# Patient Record
Sex: Female | Born: 1955 | Race: White | Hispanic: No | Smoking: Former smoker
Health system: Southern US, Community
[De-identification: ages and names within clinical notes are randomized; demographics above are authoritative.]

## PROBLEM LIST (undated history)

## (undated) DIAGNOSIS — F410 Panic disorder [episodic paroxysmal anxiety] without agoraphobia: Secondary | ICD-10-CM

## (undated) DIAGNOSIS — M8580 Other specified disorders of bone density and structure, unspecified site: Secondary | ICD-10-CM

## (undated) DIAGNOSIS — E785 Hyperlipidemia, unspecified: Secondary | ICD-10-CM

## (undated) DIAGNOSIS — Z9289 Personal history of other medical treatment: Secondary | ICD-10-CM

## (undated) DIAGNOSIS — I82409 Acute embolism and thrombosis of unspecified deep veins of unspecified lower extremity: Secondary | ICD-10-CM

## (undated) DIAGNOSIS — D649 Anemia, unspecified: Secondary | ICD-10-CM

## (undated) DIAGNOSIS — J302 Other seasonal allergic rhinitis: Secondary | ICD-10-CM

## (undated) DIAGNOSIS — M1990 Unspecified osteoarthritis, unspecified site: Secondary | ICD-10-CM

## (undated) DIAGNOSIS — I10 Essential (primary) hypertension: Secondary | ICD-10-CM

## (undated) DIAGNOSIS — E7800 Pure hypercholesterolemia, unspecified: Secondary | ICD-10-CM

## (undated) DIAGNOSIS — F209 Schizophrenia, unspecified: Secondary | ICD-10-CM

## (undated) DIAGNOSIS — F4310 Post-traumatic stress disorder, unspecified: Secondary | ICD-10-CM

## (undated) HISTORY — DX: Acute embolism and thrombosis of unspecified deep veins of unspecified lower extremity: I82.409

## (undated) HISTORY — PX: TUBAL LIGATION: SHX77

## (undated) HISTORY — PX: OTHER SURGICAL HISTORY: SHX169

## (undated) HISTORY — PX: ABDOMINAL HYSTERECTOMY: SHX81

## (undated) HISTORY — PX: APPENDECTOMY: SHX54

## (undated) HISTORY — PX: COSMETIC SURGERY: SHX468

---

## 1993-10-25 DIAGNOSIS — Z8711 Personal history of peptic ulcer disease: Secondary | ICD-10-CM

## 1993-10-25 HISTORY — DX: Personal history of peptic ulcer disease: Z87.11

## 2007-03-26 ENCOUNTER — Emergency Department (HOSPITAL_COMMUNITY): Admission: EM | Admit: 2007-03-26 | Discharge: 2007-03-26 | Payer: Self-pay | Admitting: Emergency Medicine

## 2009-12-12 ENCOUNTER — Emergency Department (HOSPITAL_COMMUNITY): Admission: EM | Admit: 2009-12-12 | Discharge: 2009-12-12 | Payer: Self-pay | Admitting: Family Medicine

## 2015-07-23 ENCOUNTER — Encounter (HOSPITAL_COMMUNITY): Payer: Self-pay | Admitting: *Deleted

## 2015-07-23 ENCOUNTER — Observation Stay (HOSPITAL_COMMUNITY)
Admission: EM | Admit: 2015-07-23 | Discharge: 2015-07-24 | Disposition: A | Discharge status: Home / Self Care | Payer: Non-veteran care | Attending: Internal Medicine | Admitting: Internal Medicine

## 2015-07-23 ENCOUNTER — Observation Stay (HOSPITAL_COMMUNITY): Payer: Non-veteran care

## 2015-07-23 ENCOUNTER — Emergency Department (HOSPITAL_COMMUNITY): Payer: Non-veteran care

## 2015-07-23 DIAGNOSIS — Z720 Tobacco use: Secondary | ICD-10-CM

## 2015-07-23 DIAGNOSIS — R0789 Other chest pain: Secondary | ICD-10-CM | POA: Diagnosis present

## 2015-07-23 DIAGNOSIS — M79601 Pain in right arm: Secondary | ICD-10-CM | POA: Diagnosis not present

## 2015-07-23 DIAGNOSIS — M8580 Other specified disorders of bone density and structure, unspecified site: Secondary | ICD-10-CM | POA: Insufficient documentation

## 2015-07-23 DIAGNOSIS — M940 Chondrocostal junction syndrome [Tietze]: Secondary | ICD-10-CM | POA: Diagnosis not present

## 2015-07-23 DIAGNOSIS — E780 Pure hypercholesterolemia: Secondary | ICD-10-CM | POA: Insufficient documentation

## 2015-07-23 DIAGNOSIS — I10 Essential (primary) hypertension: Secondary | ICD-10-CM | POA: Insufficient documentation

## 2015-07-23 DIAGNOSIS — R079 Chest pain, unspecified: Secondary | ICD-10-CM | POA: Diagnosis present

## 2015-07-23 DIAGNOSIS — R072 Precordial pain: Secondary | ICD-10-CM | POA: Diagnosis not present

## 2015-07-23 DIAGNOSIS — R55 Syncope and collapse: Secondary | ICD-10-CM | POA: Diagnosis present

## 2015-07-23 DIAGNOSIS — R52 Pain, unspecified: Secondary | ICD-10-CM

## 2015-07-23 HISTORY — DX: Other seasonal allergic rhinitis: J30.2

## 2015-07-23 HISTORY — DX: Other specified disorders of bone density and structure, unspecified site: M85.80

## 2015-07-23 HISTORY — DX: Pure hypercholesterolemia, unspecified: E78.00

## 2015-07-23 HISTORY — DX: Anemia, unspecified: D64.9

## 2015-07-23 HISTORY — DX: Essential (primary) hypertension: I10

## 2015-07-23 HISTORY — DX: Hyperlipidemia, unspecified: E78.5

## 2015-07-23 LAB — CBC
HCT: 40.3 % (ref 36.0–46.0)
Hemoglobin: 13 g/dL (ref 12.0–15.0)
MCH: 29.3 pg (ref 26.0–34.0)
MCHC: 32.3 g/dL (ref 30.0–36.0)
MCV: 91 fL (ref 78.0–100.0)
PLATELETS: 293 10*3/uL (ref 150–400)
RBC: 4.43 MIL/uL (ref 3.87–5.11)
RDW: 13.9 % (ref 11.5–15.5)
WBC: 8.9 10*3/uL (ref 4.0–10.5)

## 2015-07-23 LAB — I-STAT TROPONIN, ED: TROPONIN I, POC: 0 ng/mL (ref 0.00–0.08)

## 2015-07-23 LAB — D-DIMER, QUANTITATIVE: D-Dimer, Quant: 0.32 ug/mL-FEU (ref 0.00–0.48)

## 2015-07-23 LAB — BASIC METABOLIC PANEL
Anion gap: 5 (ref 5–15)
CALCIUM: 8.8 mg/dL — AB (ref 8.9–10.3)
CO2: 26 mmol/L (ref 22–32)
CREATININE: 0.46 mg/dL (ref 0.44–1.00)
Chloride: 108 mmol/L (ref 101–111)
GFR calc Af Amer: >60 mL/min (ref >60)
GLUCOSE: 96 mg/dL (ref 65–99)
Potassium: 3.8 mmol/L (ref 3.5–5.1)
Sodium: 139 mmol/L (ref 135–145)

## 2015-07-23 LAB — TROPONIN I: Troponin I: <0.03 ng/mL (ref <0.031)

## 2015-07-23 LAB — TSH: TSH: 2.29 u[IU]/mL (ref 0.350–4.500)

## 2015-07-23 MED ORDER — MORPHINE SULFATE (PF) 2 MG/ML IV SOLN
2.0000 mg | INTRAVENOUS | Status: DC | PRN
Start: 2015-07-23 — End: 2015-07-24

## 2015-07-23 MED ORDER — MORPHINE SULFATE (PF) 4 MG/ML IV SOLN
4.0000 mg | Freq: Once | INTRAVENOUS | Status: AC
  Administered 2015-07-23: 4 mg via INTRAVENOUS
  Filled 2015-07-23: qty 1

## 2015-07-23 MED ORDER — ENOXAPARIN SODIUM 40 MG/0.4ML ~~LOC~~ SOLN
40.0000 mg | SUBCUTANEOUS | Status: DC
  Administered 2015-07-23: 40 mg via SUBCUTANEOUS
  Filled 2015-07-23: qty 0.4

## 2015-07-23 MED ORDER — LORATADINE 10 MG PO TABS: 10.0000 mg | ORAL_TABLET | Freq: Every day | ORAL | Status: DC | PRN

## 2015-07-23 MED ORDER — GUAIFENESIN 100 MG/5ML PO SOLN
30.0000 mL | Freq: Two times a day (BID) | ORAL | Status: DC | PRN
  Filled 2015-07-23: qty 30

## 2015-07-23 MED ORDER — PANTOPRAZOLE SODIUM 40 MG PO TBEC
40.0000 mg | DELAYED_RELEASE_TABLET | Freq: Every day | ORAL | Status: DC
  Administered 2015-07-23 – 2015-07-24 (×2): 40 mg via ORAL
  Filled 2015-07-23 (×2): qty 1

## 2015-07-23 MED ORDER — ACETAMINOPHEN 325 MG PO TABS: 650.0000 mg | ORAL_TABLET | ORAL | Status: DC | PRN

## 2015-07-23 MED ORDER — INFLUENZA VAC SPLIT QUAD 0.5 ML IM SUSY
0.5000 mL | PREFILLED_SYRINGE | INTRAMUSCULAR | Status: AC
  Administered 2015-07-24: 0.5 mL via INTRAMUSCULAR
  Filled 2015-07-23: qty 0.5

## 2015-07-23 MED ORDER — SODIUM CHLORIDE 0.9 % IV BOLUS (SEPSIS)
1000.0000 mL | Freq: Once | INTRAVENOUS | Status: AC
  Administered 2015-07-23: 1000 mL via INTRAVENOUS

## 2015-07-23 MED ORDER — ONDANSETRON HCL 4 MG/2ML IJ SOLN
4.0000 mg | Freq: Once | INTRAMUSCULAR | Status: AC
  Administered 2015-07-23: 4 mg via INTRAVENOUS
  Filled 2015-07-23: qty 2

## 2015-07-23 MED ORDER — NIACIN ER 500 MG PO TBCR
500.0000 mg | EXTENDED_RELEASE_TABLET | Freq: Two times a day (BID) | ORAL | Status: DC
  Administered 2015-07-23 – 2015-07-24 (×2): 500 mg via ORAL
  Filled 2015-07-23 (×3): qty 1

## 2015-07-23 MED ORDER — ASPIRIN EC 325 MG PO TBEC
325.0000 mg | DELAYED_RELEASE_TABLET | Freq: Every day | ORAL | Status: DC
  Administered 2015-07-24: 325 mg via ORAL
  Filled 2015-07-23: qty 1

## 2015-07-23 MED ORDER — GI COCKTAIL ~~LOC~~: 30.0000 mL | Freq: Four times a day (QID) | ORAL | Status: DC | PRN

## 2015-07-23 MED ORDER — ASPIRIN 81 MG PO TABS: 81.0000 mg | ORAL_TABLET | Freq: Two times a day (BID) | ORAL | Status: DC

## 2015-07-23 MED ORDER — KETOROLAC TROMETHAMINE 30 MG/ML IJ SOLN
30.0000 mg | Freq: Four times a day (QID) | INTRAMUSCULAR | Status: DC | PRN
  Administered 2015-07-24: 30 mg via INTRAVENOUS
  Filled 2015-07-23: qty 1

## 2015-07-23 MED ORDER — ALPRAZOLAM 0.25 MG PO TABS
0.2500 mg | ORAL_TABLET | Freq: Two times a day (BID) | ORAL | Status: DC | PRN
Start: 2015-07-23 — End: 2015-07-24

## 2015-07-23 MED ORDER — ONDANSETRON HCL 4 MG/2ML IJ SOLN: 4.0000 mg | Freq: Four times a day (QID) | INTRAMUSCULAR | Status: DC | PRN

## 2015-07-23 NOTE — H&P (Signed)
[facility] History and Physical                                                                                    Dana Lang, is a 59 y.o. female  MRN: [id number]   DOB - [birth year]  Admit Date - 07/23/2015  Outpatient Primary MD for the patient is [facility]  Referring Physician:  Domenic Moras, PA-C  Chief Complaint:   Chief Complaint  Patient presents with  . Arm Pain  . Chest Pain     HPI  Dana Lang  is a 59 y.o. female, with a pmh of hypertension, hyperlipidemia, and tobacco abuse. She presents via EMS to the ER today for right-sided chest pain. Dana Lang reports that she awoke at 2:38 this morning with right arm pain. She reported it felt as though she stuck her finger in an electrical socket. She tried to go back to sleep but developed nausea and vomited clear fluid. She noticed she had right-sided chest pain on her ribs just under her right breast. She got up to take shower a few hours later and became dizzy and nauseated once again. She felt as though she was going to pass out. She stepped out of the shower and became very diaphoretic. At this point she decided to call EMS.  The patient reports that she has had dizzy spells occasionally for the last several months.  She attributes these spells to allergies.  She is attempting to quit smoking and is taking nicotine pills 3-4 times per day.  She has continued to smoke 1 or 2 cigarettes per day while taking the nicotine pills.  She also reports feeling as though she may be coming down with the flu  - nausea and weakness.  This past weekend she was doing moderately heavy yard work - shoveling and cutting up tree branches. She has a positive cardiac family history. Her father died at age 63 with an MI. She has stress tests through the [place]. Her most recent negative stress test was in February 2015.  In the ER Cxr is neg, POC troponin is 0.0.  EKG does not show significant changes.   Review of Systems   In  addition to the HPI above,  No Fever-chills, No Headache, No changes with Vision or hearing, No problems swallowing food or Liquids, Normal bowel movements.  No Blood in stool or Urine, No dysuria, No new skin rashes or bruises, No new joints pains-aches,  No new weakness, tingling, numbness in any extremity, No recent weight gain or loss, A full 10 point Review of Systems was done, except as stated above, all other Review of Systems were negative.  Past Medical History  Past Medical History  Diagnosis Date  . Hypertension   . Osteopenia   . High cholesterol     History reviewed. No pertinent past surgical history.    Social History Social History  Substance Use Topics  . Smoking status: Current Some Day Smoker  . Smokeless tobacco: Not on file  . Alcohol Use: No   independent with ADLs.  Family History Family History  Problem Relation Age of Onset  . Cancer  Mother   . Heart attack Father 25    Prior to Admission medications   Medication Sig Start Date End Date Taking? Authorizing Provider  Ascorbic Acid (VITAMIN C) 1000 MG tablet Take 1,000 mg by mouth 3 (three) times a week.   Yes Historical Provider, MD  b complex vitamins tablet Take 1 tablet by mouth 3 (three) times a week.   Yes Historical Provider, MD  cholecalciferol (VITAMIN D) 1000 UNITS tablet Take 1,000 Units by mouth 3 (three) times a week.   Yes Historical Provider, MD  ferrous sulfate 325 (65 FE) MG tablet Take 325 mg by mouth 3 (three) times a week.   Yes Historical Provider, MD  ibuprofen (ADVIL,MOTRIN) 800 MG tablet Take 800 mg by mouth every other day.   Yes Historical Provider, MD  loratadine (CLARITIN) 10 MG tablet Take 10 mg by mouth daily as needed for allergies.   Yes Historical Provider, MD  niacin (SLO-NIACIN) 500 MG tablet Take 500 mg by mouth 2 (two) times daily.   Yes Historical Provider, MD    Allergies  Allergen Reactions  . Codeine Nausea And Vomiting    Physical  Exam  Vitals  Blood pressure 142/71, pulse 75, temperature 98 F (36.7 C), temperature source Oral, resp. rate 17, height 5\' 3"  (1.6 m), weight 86.183 kg (190 lb), SpO2 98 %.   General: Well-developed, well-nourished Caucasian female lying in bed in NAD, friend at bedside  Psych:  Normal affect and insight, Not Suicidal or Homicidal, Awake Alert, Oriented X 3.  Neuro:   Numbness and tingling in right hand with an inability to do movements of fine dexterity with right fingers.  Otherwise no acute abnormalities. She has an asymmetric smile from childhood surgery.  ENT:  Ears and Eyes appear Normal, Conjunctivae clear, PER. Moist oral mucosa without erythema or exudates.  Neck:  Supple, swollen right-sided anterior lymph node  Respiratory:  Symmetrical chest wall movement, Good air movement bilaterally, mild expiratory wheeze  Cardiac:  RRR, No Murmurs, no LE edema noted, no JVD.  Chest pain is reproducible on palpation  Abdomen:  Positive bowel sounds, Soft, Non tender, Non distended,  No masses appreciated  Skin:  No Cyanosis, Normal Skin Turgor, No Skin Rash or Bruise.  Extremities:  Able to move all 4. 5/5 strength in each,  no effusions.  Data Review  CBC  Recent Labs Lab 07/23/15 1140  WBC 8.9  HGB 13.0  HCT 40.3  PLT 293  MCV 91.0  MCH 29.3  MCHC 32.3  RDW 13.9    Chemistries   Recent Labs Lab 07/23/15 1140  NA 139  K 3.8  CL 108  CO2 26  GLUCOSE 96  BUN <5*  CREATININE 0.46  CALCIUM 8.8*    Imaging results:   Dg Chest 2 View  07/23/2015   CLINICAL DATA:  Chest and right arm pain for 2 days.  EXAM: CHEST  2 VIEW  COMPARISON:  None.  FINDINGS: The cardiac silhouette, mediastinal an hilar contours are within normal limits. There are chronic appearing bronchitic type interstitial lung changes but no definite infiltrates, effusions or edema. The bony thorax is intact.  IMPRESSION: Chronic appearing bronchitic type lung changes, likely related to smoking.  No infiltrates or effusions.   Electronically Signed   By: Marijo Sanes M.D.   On: 07/23/2015 12:26    My personal review of EKG: NSR, No ST changes noted.   Assessment & Plan  Principal Problem:   Chest pain Active  Problems:   Right arm pain   Costochondritis, acute   Pre-syncope   Tobacco abuse   Chest pain Most likely musculoskeletal/costochondritis. Pain is reproducible on palpation.  Will  check xray of the right ribs to rule out fracture.  Patient's symptoms of presyncope and diaphoresis may be explained by possible allergies +/- use of nicotine tablets and cigarettes.    However, given risk factors and family history, we will cycle troponins. Check 2-D echo. Check TSH.  If workup is negative please refer back to [facility].   Right arm /wrist pain and tingling. Suspect DeQuervain's tenosynovitis.  Will check an MR c-spine and right wrist.  OT consult request to evaluate for brace.   Tobacco abuse Approximately 38 pack year history.  Attempting to quit using nicotine tablets.  I strongly counseled the patient not to smoke while taking nicotine tablets.   HTN Not currently on medication at home.   HLD Continue slo-niacin.    Consultants Called:  none  Family Communication:   Friend at bedside.  Code Status:  Full  Condition:  Stable.  Potential Disposition: to home likely 9/29  Time spent in minutes : [place],  [place] on 07/23/2015 at 2:37 PM Between 7am to 7pm - Pager - [phone] After 7pm go to [email or web address] - password [email or web address] And look for the night coverage person covering me after hours

## 2015-07-23 NOTE — ED Notes (Signed)
Attempted report X1

## 2015-07-23 NOTE — Progress Notes (Signed)
Pt came to MRI and had cspine first, did 'ok' but doing wrist, pt had chest pain/spasms and was unable to hold still/cooperate for exam.  try later after pain meds and spasms have been controlled.

## 2015-07-23 NOTE — ED Notes (Signed)
Pt reports sudden onset of right hand and forearm pain today, woke her up last night. Also had sharp pain to right chest under her breast and recent nausea. ekg done, no acute distress noted. No injury to arm, only recent yardwork that could cause muscle strain.

## 2015-07-23 NOTE — ED Provider Notes (Signed)
CSN: [id number]     Arrival date & time 07/23/15  1039 History   First MD Initiated Contact with Patient 07/23/15 1152     Chief Complaint  Patient presents with  . Arm Pain  . Chest Pain     (Consider location/radiation/quality/duration/timing/severity/associated sxs/prior Treatment) HPI   59 year old female with history of hypertension and hypercholesterolemia, occasional smoker presenting for evaluation of chest pain and arm pain. Patient reports since yesterday she has had pain to her right forearm and right wrist. This morning the pain woke her up and she described as a sharp sensation with tingling sensation to the tips of her fingers. When she set up she then experiencing pain to her right side of chest below her right breast. Describe pain as a sharp sensation worsening with movement. When asked if she felt nauseous she said yes. She also mentioned that she was diaphoretic this morning as well. She took an aspirin prior to arrival but did not take her regular medication. Reports pain is mild to moderate at this time. She protruded her pain to recent yardwork she performed 3-4 days ago. States she was cutting tree limbs and picking up yard waste. She also works out twice weekly and intensity has not changed. She did try to quit smoking recently and unsure if the nausea is related to that. She denies any prior history of MI. States that she had performed a cardiac treadmill test 1-2 years prior and states that it was normal. She does take medication for hypertension and hypercholesterolemia. She denies having fever, chills, headache, lightheadedness, dizziness, shortness of breath, productive cough, back pain, abdominal pain, or rash.  Past Medical History  Diagnosis Date  . Hypertension   . Osteopenia   . High cholesterol    History reviewed. No pertinent past surgical history. History reviewed. No pertinent family history. Social History  Substance Use Topics  . Smoking status:  Current Some Day Smoker  . Smokeless tobacco: None  . Alcohol Use: No   OB History    No data available     Review of Systems  All other systems reviewed and are negative.     Allergies  Codeine  Home Medications   Prior to Admission medications   Not on File   BP 151/67 mmHg  Pulse 79  Temp(Src) 98 F (36.7 C) (Oral)  Resp 16  Ht 5\' 3"  (1.6 m)  Wt 190 lb (86.183 kg)  BMI 33.67 kg/m2  SpO2 98% Physical Exam  Constitutional: She is oriented to person, place, and time. She appears well-developed and well-nourished. No distress.  HENT:  Head: Atraumatic.  Eyes: Conjunctivae are normal.  Neck: Normal range of motion. Neck supple.  Cardiovascular: Normal rate, regular rhythm and intact distal pulses.   Pulmonary/Chest: Effort normal and breath sounds normal. No respiratory distress. She has no wheezes. She has no rales. She exhibits tenderness (tenderness to right anterior chest below right breast on palpation without crepitus or emphysema. No rash noted.).  Abdominal: Soft. There is no tenderness.  Musculoskeletal: She exhibits tenderness (Tenderness along right forearm, and right wrist on palpation without focal point tenderness. Normal overlying skin changes, no swelling on and no signs of infection noted. Radial pulse 2+. Compartment is soft. Normal grip strength.). She exhibits no edema.  Neurological: She is alert and oriented to person, place, and time.  Skin: No rash noted.  Psychiatric: She has a normal mood and affect.  Nursing note and vitals reviewed.   ED Course  Procedures (including critical care time)  Patient here with reproducible right-sided chest wall pain as well as right forearm pain. Recent yardwork. Patient is left-handed. She does have cardiac risk factors including hypertension, hypercholesterolemia, and is a smoker. Workup initiated. Pain medication offer patient declined. Her initial EKG shows no concerning ischemic changes.  HEART score of  4-5, which puts her at moderate risk for MACE.  Care discussed with Dr. Rex Kras.  Plan to perform work up and consult admission for chest pain rule out.    1:28 PM Chest x-ray shows chronic appearing bronchitic lung changes but no acute infiltrate concerning for pneumonia. Her labs reassuring. Troponin is negative. He given her risk factors, will consult medicine for admission. Patient agrees with plan. At this time she is without active chest pain she only complaining of right forearm pain which I will be giving her pain medication.  1:51 PM Appreciate consultation from Hospitalist PA, Dorinda Hill who agrees to see pt in ER and request holding order to be placed.  Pt will go to observation unit, on telemetry bed.    Labs Review Labs Reviewed  BASIC METABOLIC PANEL - Abnormal; Notable for the following:    BUN <5 (*)    Calcium 8.8 (*)    All other components within normal limits  CBC  I-STAT TROPOININ, ED    Imaging Review Dg Chest 2 View  07/23/2015   CLINICAL DATA:  Chest and right arm pain for 2 days.  EXAM: CHEST  2 VIEW  COMPARISON:  None.  FINDINGS: The cardiac silhouette, mediastinal an hilar contours are within normal limits. There are chronic appearing bronchitic type interstitial lung changes but no definite infiltrates, effusions or edema. The bony thorax is intact.  IMPRESSION: Chronic appearing bronchitic type lung changes, likely related to smoking. No infiltrates or effusions.   Electronically Signed   By: Marijo Sanes M.D.   On: 07/23/2015 12:26   I have personally reviewed and evaluated these images and lab results as part of my medical decision-making.   EKG Interpretation   Date/Time:  Wednesday July 23 2015 10:57:33 EDT Ventricular Rate:  76 PR Interval:  134 QRS Duration: 78 QT Interval:  386 QTC Calculation: 434 R Axis:   43 Text Interpretation:  Normal sinus rhythm Possible Left atrial enlargement  Borderline ECG isolated T wave inversions in aVL and  V2 without contiguous  changes Confirmed by LITTLE MD, RACHEL ([id number]) on 07/23/2015 12:18:34 PM        MDM   Final diagnoses:  Chest pain, unspecified chest pain type    BP 136/76 mmHg  Pulse 70  Temp(Src) 98 F (36.7 C) (Oral)  Resp 16  Ht 5\' 3"  (1.6 m)  Wt 190 lb (86.183 kg)  BMI 33.67 kg/m2  SpO2 98%     Domenic Moras, PA-C 07/23/15 [place], MD 07/23/15 [phone]

## 2015-07-24 ENCOUNTER — Other Ambulatory Visit (HOSPITAL_COMMUNITY): Payer: Non-veteran care

## 2015-07-24 ENCOUNTER — Observation Stay (HOSPITAL_COMMUNITY): Payer: Non-veteran care

## 2015-07-24 DIAGNOSIS — Z720 Tobacco use: Secondary | ICD-10-CM | POA: Diagnosis not present

## 2015-07-24 DIAGNOSIS — R0789 Other chest pain: Secondary | ICD-10-CM | POA: Diagnosis not present

## 2015-07-24 DIAGNOSIS — M79601 Pain in right arm: Secondary | ICD-10-CM | POA: Diagnosis not present

## 2015-07-24 DIAGNOSIS — M940 Chondrocostal junction syndrome [Tietze]: Secondary | ICD-10-CM | POA: Diagnosis not present

## 2015-07-24 LAB — TROPONIN I: Troponin I: <0.03 ng/mL (ref <0.031)

## 2015-07-24 MED ORDER — METHOCARBAMOL 500 MG PO TABS: 500.0000 mg | ORAL_TABLET | Freq: Three times a day (TID) | ORAL | Status: DC | PRN

## 2015-07-24 MED ORDER — PANTOPRAZOLE SODIUM 40 MG PO TBEC: 40.0000 mg | DELAYED_RELEASE_TABLET | Freq: Every day | ORAL | Status: DC

## 2015-07-24 NOTE — Discharge Summary (Signed)
Physician Discharge Summary  Dana Lang:[id number] DOB: [birth year] DOA: 07/23/2015  PCP: [place] date: 07/23/2015 Discharge date: 07/24/2015  Time spent: 35 minutes  Recommendations for Outpatient Follow-up:  1. Outpatient NS eval  Discharge Diagnoses:  Principal Problem:   Chest pain Active Problems:   Right arm pain   Costochondritis, acute   Pre-syncope   Tobacco abuse   Atypical chest pain   Discharge Condition: improved  Diet recommendation: cardiac  Filed Weights   07/23/15 1056 07/24/15 0500  Weight: 86.183 kg (190 lb) 86.682 kg (191 lb 1.6 oz)    History of present illness:  Dana Lang is a 59 y.o. female, with a pmh of hypertension, hyperlipidemia, and tobacco abuse. She presents via EMS to the ER today for right-sided chest pain. Miss Dionisio reports that she awoke at 2:38 this morning with right arm pain. She reported it felt as though she stuck her finger in an electrical socket. She tried to go back to sleep but developed nausea and vomited clear fluid. She noticed she had right-sided chest pain on her ribs just under her right breast. She got up to take shower a few hours later and became dizzy and nauseated once again. She felt as though she was going to pass out. She stepped out of the shower and became very diaphoretic. At this point she decided to call EMS. The patient reports that she has had dizzy spells occasionally for the last several months. She attributes these spells to allergies. She is attempting to quit smoking and is taking nicotine pills 3-4 times per day. She has continued to smoke 1 or 2 cigarettes per day while taking the nicotine pills. She also reports feeling as though she may be coming down with the flu - nausea and weakness. This past weekend she was doing moderately heavy yard work - shoveling and cutting up tree branches. She has a positive cardiac family history. Her father died at age 40 with an MI.  She has stress tests through the [place]. Her most recent negative stress test was in February 2015.  In the ER Cxr is neg, POC troponin is 0.0. EKG does not show significant changes  Hospital Course:  Chest pain Most likely musculoskeletal/costochondritis. Pain is reproducible on palpation.  - check xray of the right ribs neagtive.  -cycle troponins negative.   Right arm /wrist pain and tingling. Appears to be from nerve impingement.  Tobacco abuse Approximately 38 pack year history. Attempting to quit using nicotine tablets. I strongly counseled the patient not to smoke while taking nicotine tablets.  HTN Not currently on medication at home.  HLD Continue slo-niacin.  Procedures:  MRI  Consultations:    Discharge Exam: Filed Vitals:   07/24/15 0417  BP: 131/60  Pulse: 67  Temp: 98.3 F (36.8 C)  Resp: 16    General: awake, NAD- pain free   Discharge Instructions    Current Discharge Medication List    CONTINUE these medications which have NOT CHANGED   Details  Ascorbic Acid (VITAMIN C) 1000 MG tablet Take 1,000 mg by mouth 3 (three) times a week.    aspirin 81 MG tablet Take 81 mg by mouth 2 (two) times daily.    b complex vitamins tablet Take 1 tablet by mouth 3 (three) times a week.    cholecalciferol (VITAMIN D) 1000 UNITS tablet Take 1,000 Units by mouth 3 (three) times a week.    ferrous sulfate 325 (65 FE)  MG tablet Take 325 mg by mouth 3 (three) times a week.    guaiFENesin (ROBITUSSIN) 100 MG/5ML liquid Take 30 mLs by mouth 2 (two) times daily as needed for cough.    ibuprofen (ADVIL,MOTRIN) 800 MG tablet Take 800 mg by mouth every other day.    loratadine (CLARITIN) 10 MG tablet Take 10 mg by mouth daily as needed for allergies.    niacin (SLO-NIACIN) 500 MG tablet Take 500 mg by mouth 2 (two) times daily.       Allergies  Allergen Reactions  . Codeine Nausea And Vomiting      The results of significant diagnostics from this  hospitalization (including imaging, microbiology, ancillary and laboratory) are listed below for reference.    Significant Diagnostic Studies: Dg Chest 2 View  07/23/2015   CLINICAL DATA:  Chest and right arm pain for 2 days.  EXAM: CHEST  2 VIEW  COMPARISON:  None.  FINDINGS: The cardiac silhouette, mediastinal an hilar contours are within normal limits. There are chronic appearing bronchitic type interstitial lung changes but no definite infiltrates, effusions or edema. The bony thorax is intact.  IMPRESSION: Chronic appearing bronchitic type lung changes, likely related to smoking. No infiltrates or effusions.   Electronically Signed   By: Marijo Sanes M.D.   On: 07/23/2015 12:26   Dg Ribs Unilateral Right  07/23/2015   CLINICAL DATA:  Fall landing on right side today, now with right lower rib pain.  EXAM: RIGHT RIBS - 2 VIEW  COMPARISON:  Frontal and lateral chest radiographs performed earlier this day.  FINDINGS: No fracture or other bone lesions are seen involving the ribs. No consolidation, pleural effusion or pneumothorax in the included right lung.  IMPRESSION: Intact right ribs without evidence of fracture.   Electronically Signed   By: Jeb Levering M.D.   On: 07/23/2015 18:11   Mr Cervical Spine Wo Contrast  07/23/2015   CLINICAL DATA:  Atypical chest pain and right upper extremity pain after raking and pulling weeds in the [place] the few days ago.  EXAM: MRI CERVICAL SPINE WITHOUT CONTRAST  TECHNIQUE: Multiplanar, multisequence MR imaging of the cervical spine was performed. No intravenous contrast was administered.  COMPARISON:  None.  FINDINGS: Despite efforts by the technologist and patient, motion artifact is present on today's exam and could not be eliminated. This reduces exam sensitivity and specificity.  There is some degenerative spurring at the craniocervical junction. I doubt that this is impinging on the C1 nerves. Minimal edema anteriorly in the odontoid likely from C1-2  arthropathy.  No significant abnormal spinal cord signal is observed. No vertebral subluxation is observed. Hemangioma observed eccentric to the right at the T1 level.  Additional findings at individual levels are as follows:  C2-3:  No impingement.  Right greater than left facet arthropathy.  C3-4: Moderate to prominent right and moderate left foraminal stenosis and borderline central narrowing of the thecal sac due to facet and uncinate spurring along with small central disc protrusion.  C4-5: Mild left foraminal stenosis and borderline central narrowing of the thecal sac due to central disc protrusion, facet arthropathy, and ligamentum flavum redundancy. There is also some vertebral artery loop formation in the left neural foramen which can rarely contribute to impingement.  C5-6: Moderate right and mild to moderate left foraminal stenosis and mild central narrowing of the thecal sac due to disc bulge, uncinate spurring, and facet arthropathy. There is also vertebral artery loop formation in the right neural foramen at  this level, which can't really contribute to impingement.  C6-7: Mild bilateral foraminal stenosis and mild left eccentric central narrowing of the thecal sac due to the left paracentral disc protrusion and spurring, uncinate spurring, and facet arthropathy.  C7-T1:  No impingement.  Mild facet arthropathy.  IMPRESSION: 1. Cervical spondylosis and degenerative disc disease, causing moderate to prominent impingement at C3-4 ; moderate impingement at C5-6 ; and mild impingement at C4-5 and C6-7, as detailed above.   Electronically Signed   By: Van Clines M.D.   On: 07/23/2015 17:35    Microbiology: No results found for this or any previous visit (from the past 240 hour(s)).   Labs: Basic Metabolic Panel:  Recent Labs Lab 07/23/15 1140  NA 139  K 3.8  CL 108  CO2 26  GLUCOSE 96  BUN <5*  CREATININE 0.46  CALCIUM 8.8*   Liver Function Tests: No results for input(s): AST,  ALT, ALKPHOS, BILITOT, PROT, ALBUMIN in the last 168 hours. No results for input(s): LIPASE, AMYLASE in the last 168 hours. No results for input(s): AMMONIA in the last 168 hours. CBC:  Recent Labs Lab 07/23/15 1140  WBC 8.9  HGB 13.0  HCT 40.3  MCV 91.0  PLT 293   Cardiac Enzymes:  Recent Labs Lab 07/23/15 1900 07/24/15 0054  TROPONINI <0.03 <0.03   BNP: BNP (last 3 results) No results for input(s): BNP in the last 8760 hours.  ProBNP (last 3 results) No results for input(s): PROBNP in the last 8760 hours.  CBG: No results for input(s): GLUCAP in the last 168 hours.     SignedEulogio Bear  [facility] 07/24/2015, 10:36 AM

## 2015-08-06 ENCOUNTER — Encounter: Payer: Self-pay | Admitting: Internal Medicine

## 2015-10-02 ENCOUNTER — Encounter: Payer: Self-pay | Admitting: Internal Medicine

## 2015-10-02 ENCOUNTER — Ambulatory Visit (INDEPENDENT_AMBULATORY_CARE_PROVIDER_SITE_OTHER): Payer: No Typology Code available for payment source | Admitting: Internal Medicine

## 2015-10-02 VITALS — BP 118/72 | HR 92 | Ht 63.0 in | Wt 192.0 lb

## 2015-10-02 DIAGNOSIS — K625 Hemorrhage of anus and rectum: Secondary | ICD-10-CM | POA: Diagnosis not present

## 2015-10-02 DIAGNOSIS — K219 Gastro-esophageal reflux disease without esophagitis: Secondary | ICD-10-CM | POA: Diagnosis not present

## 2015-10-02 DIAGNOSIS — K5909 Other constipation: Secondary | ICD-10-CM

## 2015-10-02 DIAGNOSIS — R1012 Left upper quadrant pain: Secondary | ICD-10-CM

## 2015-10-02 MED ORDER — NA SULFATE-K SULFATE-MG SULF 17.5-3.13-1.6 GM/177ML PO SOLN: 1.0000 | Freq: Once | ORAL | Status: DC

## 2015-10-02 NOTE — Patient Instructions (Signed)

## 2015-10-02 NOTE — Progress Notes (Signed)
HISTORY OF PRESENT ILLNESS:  Dana Lang is a 59 y.o. female , retired Social research officer, government, with past history as listed below. She is referred by her primary care providers at the [facility] (Dr. Peyton Lang) with chief complaints of upper abdominal pain, and rectal bleeding. There are no accompanying records. Patient reports having undergone colonoscopy in 1992 elsewhere. Results uncertain. She also tells me that she underwent upper endoscopy in 1986 and around 1995 for "bleeding ulcers". Again, no details. Current history is that of intermittent left upper quadrant pain seemingly exacerbated by certain movements. No effects from meals. She does have GERD for which she takes pantoprazole 40 mg daily. Symptoms controlled with medication. Significant symptoms off medication. Despite a history of bleeding ulcers, she does take ibuprofen 800 mg almost daily. She denies dysphagia. She does report history of esophageal cancer in her mother. Next, she has had problems with intermittent constipation for which she takes Dulcolax. Intermittent rectal bleeding (minor) attributed to hemorrhoids. She has had 10 pound weight gain over the past year. Recent hospitalization for noncardiac chest pain (reviewed). Possible history of colon cancer in her mother, though she is not certain. Other GI complaints include bloating, anal discomfort. She is status post appendectomy and hysterectomy. She smokes daily. Finally, she is on chronic iron she tells me for "pernicious anemia".  REVIEW OF SYSTEMS:  All non-GI ROS negative except for sinus and allergy, anxiety, arthritis, back pain, visual change, fatigue, headaches, hearing problems, muscle cramps, night sweats, shortness of breath, ankle edema, lymph nodes, increased thirst, increased urination, urinary frequency, urinary leakage  Past Medical History  Diagnosis Date  . Hypertension   . Osteopenia   . High cholesterol   . Hyperlipidemia   . Anemia   . Seasonal allergies   .  Osteopenia     Past Surgical History  Procedure Laterality Date  . Abdominal hysterectomy    . Appendectomy    . Cosmetic surgery    . Tubal ligation      Social History Dana Lang  reports that she quit smoking about 2 months ago. Her smoking use included Cigarettes. She quit after 40 years of use. She has never used smokeless tobacco. She reports that she does not drink alcohol or use illicit drugs.  family history includes Cancer in her mother; Heart attack (age of onset: 2) in her father.  Allergies  Allergen Reactions  . Codeine Nausea And Vomiting       PHYSICAL EXAMINATION: Vital signs: BP 118/72 mmHg  Pulse 92  Ht 5\' 3"  (1.6 m)  Wt 192 lb (87.091 kg)  BMI 34.02 kg/m2  Constitutional: Unhealthy appearing, no acute distress. Pleasant Psychiatric: alert and oriented x3, cooperative Eyes: extraocular movements intact, anicteric, conjunctiva pink Mouth: oral pharynx moist, no lesions Neck: supple without thyromegaly Lymph: no lymphadenopathy Cardiovascular: heart regular rate and rhythm, no murmur Lungs: clear to auscultation bilaterally Abdomen: soft, obese, nontender, nondistended, no obvious ascites, no peritoneal signs, normal bowel sounds, no organomegaly Rectal: To be performed at colonoscopy Extremities: no clubbing cyanosis or lower extremity edema bilaterally Skin: no lesions on visible extremities Neuro: No focal deficits. Cranial nerves intact. No asterixis.   ASSESSMENT:  #1. Intermittent problems with upper abdominal pain, mainly left upper quadrant. By history sounds musculoskeletal. #2. History of complicated ulcer disease (bleeding) per patient. H. pylori status uncertain. She takes regular NSAIDs for joint aches #3. Constipation. Requires Dulcolax #4. Intermittent rectal bleeding. Rule out neoplasia #5. Gen. medical problems. Cared for at  [place]   PLAN:  #1. Upper endoscopy to evaluate upper abdominal pain and patient with a  history of complicated ulcer disease. Also, obtain biopsies to rule out Helicobacter pylori.The nature of the procedure, as well as the risks, benefits, and alternatives were carefully and thoroughly reviewed with the patient. Ample time for discussion and questions allowed. The patient understood, was satisfied, and agreed to proceed. #2. Colonoscopy to evaluate rectal bleeding. Also provide colon cancer screening. Last colonoscopy by her report 25 years ago.The nature of the procedure, as well as the risks, benefits, and alternatives were carefully and thoroughly reviewed with the patient. Ample time for discussion and questions allowed. The patient understood, was satisfied, and agreed to proceed. #3. Continue Dulcolax as needed for constipation #4. Reflux precautions with attention to weight loss #5. Continue pantoprazole to control GERD symptoms as well as protect against recurrent ulcers in a patient who uses NSAIDs #6. Discussed with your primary care physician alternatives to NSAIDs given your history of complicated ulcer disease, for your pain management  A copy of this consultation note has been sent to Dr. Peyton Lang

## 2015-11-17 ENCOUNTER — Telehealth: Payer: Self-pay | Admitting: Internal Medicine

## 2015-11-17 NOTE — Telephone Encounter (Signed)
Left message for patient to call me back. 

## 2015-11-19 NOTE — Telephone Encounter (Signed)
Left message to call me back.

## 2015-11-20 NOTE — Telephone Encounter (Signed)
Dana Lang, CMA spoke with patient and put a suprep sample up front for pick up yesterday.

## 2015-11-24 ENCOUNTER — Encounter: Payer: Self-pay | Admitting: Internal Medicine

## 2015-11-24 ENCOUNTER — Ambulatory Visit (INDEPENDENT_AMBULATORY_CARE_PROVIDER_SITE_OTHER): Payer: Non-veteran care | Admitting: Internal Medicine

## 2015-11-24 VITALS — BP 184/94 | HR 74 | Temp 97.7°F | Resp 22 | Ht 63.0 in | Wt 192.0 lb

## 2015-11-24 DIAGNOSIS — K253 Acute gastric ulcer without hemorrhage or perforation: Secondary | ICD-10-CM

## 2015-11-24 DIAGNOSIS — K625 Hemorrhage of anus and rectum: Secondary | ICD-10-CM | POA: Diagnosis not present

## 2015-11-24 DIAGNOSIS — K5730 Diverticulosis of large intestine without perforation or abscess without bleeding: Secondary | ICD-10-CM

## 2015-11-24 DIAGNOSIS — Z1211 Encounter for screening for malignant neoplasm of colon: Secondary | ICD-10-CM

## 2015-11-24 DIAGNOSIS — K219 Gastro-esophageal reflux disease without esophagitis: Secondary | ICD-10-CM | POA: Diagnosis not present

## 2015-11-24 DIAGNOSIS — R1012 Left upper quadrant pain: Secondary | ICD-10-CM

## 2015-11-24 MED ORDER — SODIUM CHLORIDE 0.9 % IV SOLN: 500.0000 mL | INTRAVENOUS | Status: DC

## 2015-11-24 NOTE — Op Note (Signed)
[place]  [address]. [place], [address]   COLONOSCOPY PROCEDURE REPORT  PATIENT: Dana Lang, Dana Lang  MR#: [id number] BIRTHDATE: 1956/09/07 , 43  yrs. old GENDER: female ENDOSCOPIST: Eustace Quail, MD REFERRED BY: [facility]. PROCEDURE DATE:  11/24/2015 PROCEDURE:   Colonoscopy, screening First Screening Colonoscopy - Avg.  risk and is 50 yrs.  old or older - No.  Prior Negative Screening - Now for repeat screening. 10 or more years since last screening  History of Adenoma - Now for follow-up colonoscopy & has been > or = to 3 yrs.  N/A  Polyps removed today? No Recommend repeat exam, <10 yrs? No ASA CLASS:   Class II INDICATIONS:Screening for colonic neoplasia and Colorectal Neoplasm Risk Assessment for this procedure is average risk. MEDICATIONS: Propofol 250 mg IV and Monitored anesthesia care  DESCRIPTION OF PROCEDURE:   After the risks benefits and alternatives of the procedure were thoroughly explained, informed consent was obtained.  The digital rectal exam revealed no abnormalities of the rectum.   The LB [id number]  endoscope was introduced through the anus and advanced to the cecum, which was identified by both the appendix and ileocecal valve. No adverse events experienced.   The quality of the prep was adequate (Suprep was used)  The instrument was then slowly withdrawn as the colon was fully examined. Estimated blood loss is zero unless otherwise noted in this procedure report.    COLON FINDINGS: There was mild diverticulosis noted in the right colon and left colon.   The examination was otherwise normal. Retroflexed views revealed internal hemorrhoids. The time to cecum = 3.4 Withdrawal time = 11.6   The scope was withdrawn and the procedure completed. COMPLICATIONS: There were no immediate complications.  ENDOSCOPIC IMPRESSION: 1.   Mild diverticulosis was noted in the right colon and left colon  2.   The  examination was otherwise normal  RECOMMENDATIONS: 1.  Continue current colorectal screening recommendations for "routine risk" patients with a repeat colonoscopy in 10 years. 2.  Upper endoscopy today (please see report for findings and recommendations)  eSigned:  Eustace Quail, MD 11/24/2015 1:40 PM   cc: The Patient  ; [facility]

## 2015-11-24 NOTE — Patient Instructions (Signed)

## 2015-11-24 NOTE — Progress Notes (Signed)
Report to PACU, RN, vss, BBS= Clear.  

## 2015-11-24 NOTE — Op Note (Signed)
[place]  [address]. [place], [address]   ENDOSCOPY PROCEDURE REPORT  PATIENT: Dana Lang, Dana Lang  MR#: [id number] BIRTHDATE: Sep 05, 1956 , 84  yrs. old GENDER: female ENDOSCOPIST: Eustace Quail, MD REFERRED BY:   [facility]. PROCEDURE DATE:  11/24/2015 PROCEDURE:  EGD w/ biopsy ASA CLASS:     Class II INDICATIONS:  abdominal pain. MEDICATIONS: Monitored anesthesia care and Propofol 250 mg IV TOPICAL ANESTHETIC: none  DESCRIPTION OF PROCEDURE: After the risks benefits and alternatives of the procedure were thoroughly explained, informed consent was obtained.  The LB [id number] endoscope was introduced through the mouth and advanced to the second portion of the duodenum , Without limitations.  The instrument was slowly withdrawn as the mucosa was fully examined.  EXAM:Esophagus was normal.  The stomach revealed a small prepyloric ulcer and several erosions.  CLO biopsy taken.  The duodenum was normal.  Retroflexed views revealed no abnormalities.     The scope was then withdrawn from the patient and the procedure completed.  COMPLICATIONS: There were no immediate complications.  ENDOSCOPIC IMPRESSION: 1. Gastric ulcer and erosions likely secondary to NSAIDs, status post CLO biopsy 2. GERD (by history)  RECOMMENDATIONS: 1.  Continue pantoprazole daily 2.  Avoid NSAIDS given the presence of ulcers on today's exam and a history of complicated ulcers. Talk to your primary care physician about other alternative treatments for your joint pains 3.  Rx CLO if positive  REPEAT EXAM:  eSigned:  Eustace Quail, MD 11/24/2015 1:49 PM    CC:The Patient  ; [facility]

## 2015-11-24 NOTE — Progress Notes (Signed)
Called to room to assist during endoscopic procedure.  Patient ID and intended procedure confirmed with present staff. Received instructions for my participation in the procedure from the performing physician.  

## 2015-11-25 ENCOUNTER — Telehealth: Payer: Self-pay | Admitting: *Deleted

## 2015-11-25 LAB — HELICOBACTER PYLORI SCREEN-BIOPSY: UREASE: POSITIVE — AB

## 2015-11-25 NOTE — Telephone Encounter (Signed)
Invalid telephone number, unable to leave a message.

## 2015-11-27 ENCOUNTER — Other Ambulatory Visit: Payer: Self-pay

## 2015-11-27 MED ORDER — METRONID-TETRACYC-BIS SUBSAL PO MISC: ORAL | Status: DC

## 2015-11-28 ENCOUNTER — Other Ambulatory Visit: Payer: Self-pay

## 2015-11-28 MED ORDER — OMEPRAZOLE 20 MG PO CPDR: 20.0000 mg | DELAYED_RELEASE_CAPSULE | Freq: Two times a day (BID) | ORAL | Status: DC

## 2015-11-28 MED ORDER — METRONIDAZOLE 250 MG PO TABS: 250.0000 mg | ORAL_TABLET | Freq: Four times a day (QID) | ORAL | Status: DC

## 2015-11-28 MED ORDER — AMOXICILLIN 500 MG PO CAPS: 500.0000 mg | ORAL_CAPSULE | Freq: Four times a day (QID) | ORAL | Status: DC

## 2015-12-03 ENCOUNTER — Telehealth: Payer: Self-pay | Admitting: Internal Medicine

## 2015-12-03 NOTE — Telephone Encounter (Signed)
Spoke with pt and discussed with her that she is to take both antibiotics together to treat the H pylori and to be sure and finish the meds. Pt verbalized understanding.

## 2016-02-21 ENCOUNTER — Encounter (HOSPITAL_COMMUNITY): Payer: Self-pay

## 2016-02-21 ENCOUNTER — Emergency Department (HOSPITAL_COMMUNITY)
Admission: EM | Admit: 2016-02-21 | Discharge: 2016-02-21 | Disposition: A | Discharge status: Home / Self Care | Payer: Non-veteran care | Attending: Emergency Medicine | Admitting: Emergency Medicine

## 2016-02-21 DIAGNOSIS — Z792 Long term (current) use of antibiotics: Secondary | ICD-10-CM | POA: Diagnosis not present

## 2016-02-21 DIAGNOSIS — M8580 Other specified disorders of bone density and structure, unspecified site: Secondary | ICD-10-CM | POA: Insufficient documentation

## 2016-02-21 DIAGNOSIS — Z8639 Personal history of other endocrine, nutritional and metabolic disease: Secondary | ICD-10-CM | POA: Diagnosis not present

## 2016-02-21 DIAGNOSIS — Z7982 Long term (current) use of aspirin: Secondary | ICD-10-CM | POA: Insufficient documentation

## 2016-02-21 DIAGNOSIS — B86 Scabies: Secondary | ICD-10-CM

## 2016-02-21 DIAGNOSIS — D649 Anemia, unspecified: Secondary | ICD-10-CM | POA: Insufficient documentation

## 2016-02-21 DIAGNOSIS — Z791 Long term (current) use of non-steroidal anti-inflammatories (NSAID): Secondary | ICD-10-CM | POA: Insufficient documentation

## 2016-02-21 DIAGNOSIS — Z79899 Other long term (current) drug therapy: Secondary | ICD-10-CM | POA: Diagnosis not present

## 2016-02-21 DIAGNOSIS — F17210 Nicotine dependence, cigarettes, uncomplicated: Secondary | ICD-10-CM | POA: Insufficient documentation

## 2016-02-21 DIAGNOSIS — I10 Essential (primary) hypertension: Secondary | ICD-10-CM | POA: Insufficient documentation

## 2016-02-21 DIAGNOSIS — R21 Rash and other nonspecific skin eruption: Secondary | ICD-10-CM | POA: Diagnosis present

## 2016-02-21 MED ORDER — PERMETHRIN 5 % EX CREA: TOPICAL_CREAM | CUTANEOUS | Status: DC

## 2016-02-21 MED ORDER — HYDROXYZINE HCL 10 MG PO TABS
10.0000 mg | ORAL_TABLET | Freq: Four times a day (QID) | ORAL | Status: DC | PRN
Start: 2016-02-21 — End: 2021-01-19

## 2016-02-21 NOTE — ED Provider Notes (Signed)
CSN: [id number]     Arrival date & time 02/21/16  1512 History   First MD Initiated Contact with Patient 02/21/16 1607     Chief Complaint  Patient presents with  . Rash     (Consider location/radiation/quality/duration/timing/severity/associated sxs/prior Treatment) HPI Comments: Patient presents to the ED with a chief complaint of rash.  She states that she noticed a very itchy rash on her extremities that has spread distal to proximal in the past 2 days.  She states that she first noticed it around her feet and hands.  She denies any fevers or chills.  She has applied calamine lotion with minimal relief.  She denies any nausea, vomiting, or diarrhea.   The history is provided by the patient. No language interpreter was used.    Past Medical History  Diagnosis Date  . Hypertension   . Osteopenia   . High cholesterol   . Hyperlipidemia   . Anemia   . Seasonal allergies   . Osteopenia    Past Surgical History  Procedure Laterality Date  . Abdominal hysterectomy    . Appendectomy    . Cosmetic surgery    . Tubal ligation     Family History  Problem Relation Age of Onset  . Esophageal cancer Mother   . Heart attack Father 35   Social History  Substance Use Topics  . Smoking status: Current Every Day Smoker -- 0.25 packs/day for 40 years    Types: Cigarettes  . Smokeless tobacco: Never Used  . Alcohol Use: No   OB History    No data available     Review of Systems  Constitutional: Negative for fever and chills.  Respiratory: Negative for shortness of breath.   Cardiovascular: Negative for chest pain.  Gastrointestinal: Negative for nausea, vomiting, diarrhea and constipation.  Genitourinary: Negative for dysuria.  All other systems reviewed and are negative.     Allergies  Codeine  Home Medications   Prior to Admission medications   Medication Sig Start Date End Date Taking? Authorizing Provider  amoxicillin (AMOXIL) 500 MG capsule Take 1 capsule (500 mg  total) by mouth 4 (four) times daily. 11/28/15   Irene Shipper, MD  Ascorbic Acid (VITAMIN C) 1000 MG tablet Take 1,000 mg by mouth 3 (three) times a week.    Historical Provider, MD  aspirin 81 MG tablet Take 81 mg by mouth 2 (two) times daily.    Historical Provider, MD  b complex vitamins tablet Take 1 tablet by mouth 3 (three) times a week.    Historical Provider, MD  cholecalciferol (VITAMIN D) 1000 UNITS tablet Take 1,000 Units by mouth 3 (three) times a week.    Historical Provider, MD  ferrous sulfate 325 (65 FE) MG tablet Take 325 mg by mouth 3 (three) times a week.    Historical Provider, MD  guaiFENesin (ROBITUSSIN) 100 MG/5ML liquid Take 30 mLs by mouth 2 (two) times daily as needed for cough.    Historical Provider, MD  hydrOXYzine (ATARAX/VISTARIL) 10 MG tablet Take 1 tablet (10 mg total) by mouth every 6 (six) hours as needed for itching. 02/21/16   Montine Circle, PA-C  ibuprofen (ADVIL,MOTRIN) 800 MG tablet Take 800 mg by mouth every other day.    Historical Provider, MD  loratadine (CLARITIN) 10 MG tablet Take 10 mg by mouth daily as needed for allergies.    Historical Provider, MD  Metronid-Tetracyc-Bis Milagros Reap MISC Take as directed 11/27/15   Irene Shipper, MD  metroNIDAZOLE (  FLAGYL) 250 MG tablet Take 1 tablet (250 mg total) by mouth 4 (four) times daily. 11/28/15   Irene Shipper, MD  niacin (SLO-NIACIN) 500 MG tablet Take 500 mg by mouth 2 (two) times daily.    Historical Provider, MD  omeprazole (PRILOSEC) 20 MG capsule Take 1 capsule (20 mg total) by mouth 2 (two) times daily before a meal. 11/28/15   Irene Shipper, MD  pantoprazole (PROTONIX) 40 MG tablet Take 1 tablet (40 mg total) by mouth daily. 07/24/15   Geradine Girt, DO  permethrin (ELIMITE) 5 % cream Apply to entire body other than face - let sit for 14 hours then wash off, may repeat in 1 week if still having symptoms 02/21/16   Montine Circle, PA-C   BP 166/78 mmHg  Pulse 104  Temp(Src) 98.2 F (36.8 C) (Oral)  Resp 18   SpO2 98% Physical Exam  Constitutional: She is oriented to person, place, and time. She appears well-developed and well-nourished.  HENT:  Head: Normocephalic and atraumatic.  Eyes: Conjunctivae and EOM are normal.  Neck: Normal range of motion.  Cardiovascular: Normal rate.   Pulmonary/Chest: Effort normal.  Abdominal: She exhibits no distension.  Musculoskeletal: Normal range of motion.  Neurological: She is alert and oriented to person, place, and time.  Skin: Skin is dry.  Scattered maculopapular rash on extremities, appear like bites, possibly scabies vs flea vs bedbugs  Psychiatric: She has a normal mood and affect. Her behavior is normal. Judgment and thought content normal.  Nursing note and vitals reviewed.   ED Course  Procedures (including critical care time)   MDM   Final diagnoses:  Scabies    Discussed diagnosis & treatment of scabies with patient.  They have been advised to followup with her primary care doctor 2 weeks after treatment.  Discussed the proper application of permethrin cream.  Patient  advised to repeat treatment in one week. No signs of secondary infection.  Discussed return precautions. Patient advised to follow up with PCP for unresolved symptoms. Patient appears safe for discharge.     Montine Circle, PA-C 02/21/16 1613  Sherwood Gambler, MD 02/26/16 [phone]

## 2016-02-21 NOTE — ED Notes (Signed)
Seen at triage by PA and discharged, see triage note for assessmwent

## 2016-02-21 NOTE — Discharge Instructions (Signed)

## 2016-02-21 NOTE — ED Notes (Signed)
Patient here with itchy rash that started on hands and now legs , neck and abdomen.

## 2016-02-23 ENCOUNTER — Telehealth: Payer: Self-pay | Admitting: *Deleted

## 2016-02-23 NOTE — Telephone Encounter (Signed)
Pt called asking that her Rx be faxed to [facility].  ERCM faxed to  [phone] as requested.

## 2017-05-02 ENCOUNTER — Emergency Department (HOSPITAL_COMMUNITY): Payer: Non-veteran care

## 2017-05-02 ENCOUNTER — Encounter (HOSPITAL_COMMUNITY): Payer: Self-pay | Admitting: Emergency Medicine

## 2017-05-02 ENCOUNTER — Ambulatory Visit (HOSPITAL_COMMUNITY)
Admission: EM | Admit: 2017-05-02 | Discharge: 2017-05-02 | Disposition: A | Discharge status: Nursing Home (Includes ICF) | Payer: Non-veteran care | Attending: Internal Medicine | Admitting: Internal Medicine

## 2017-05-02 DIAGNOSIS — R9431 Abnormal electrocardiogram [ECG] [EKG]: Secondary | ICD-10-CM | POA: Diagnosis not present

## 2017-05-02 DIAGNOSIS — I10 Essential (primary) hypertension: Secondary | ICD-10-CM | POA: Diagnosis not present

## 2017-05-02 DIAGNOSIS — F17210 Nicotine dependence, cigarettes, uncomplicated: Secondary | ICD-10-CM | POA: Insufficient documentation

## 2017-05-02 DIAGNOSIS — Z79899 Other long term (current) drug therapy: Secondary | ICD-10-CM | POA: Diagnosis not present

## 2017-05-02 DIAGNOSIS — R002 Palpitations: Secondary | ICD-10-CM | POA: Diagnosis present

## 2017-05-02 DIAGNOSIS — R079 Chest pain, unspecified: Secondary | ICD-10-CM | POA: Insufficient documentation

## 2017-05-02 DIAGNOSIS — R51 Headache: Secondary | ICD-10-CM

## 2017-05-02 DIAGNOSIS — R000 Tachycardia, unspecified: Secondary | ICD-10-CM

## 2017-05-02 DIAGNOSIS — T50905A Adverse effect of unspecified drugs, medicaments and biological substances, initial encounter: Secondary | ICD-10-CM | POA: Insufficient documentation

## 2017-05-02 DIAGNOSIS — Z7982 Long term (current) use of aspirin: Secondary | ICD-10-CM | POA: Diagnosis not present

## 2017-05-02 LAB — BASIC METABOLIC PANEL WITH GFR
Anion gap: 11 (ref 5–15)
BUN: 10 mg/dL (ref 6–20)
CO2: 23 mmol/L (ref 22–32)
Calcium: 9.3 mg/dL (ref 8.9–10.3)
Chloride: 102 mmol/L (ref 101–111)
Creatinine, Ser: 0.61 mg/dL (ref 0.44–1.00)
GFR calc Af Amer: >60 mL/min
GFR calc non Af Amer: >60 mL/min
Glucose, Bld: 103 mg/dL — ABNORMAL HIGH (ref 65–99)
Potassium: 4 mmol/L (ref 3.5–5.1)
Sodium: 136 mmol/L (ref 135–145)

## 2017-05-02 LAB — CBC
HEMATOCRIT: 44.4 % (ref 36.0–46.0)
Hemoglobin: 14.6 g/dL (ref 12.0–15.0)
MCH: 29.7 pg (ref 26.0–34.0)
MCHC: 32.9 g/dL (ref 30.0–36.0)
MCV: 90.4 fL (ref 78.0–100.0)
PLATELETS: 329 10*3/uL (ref 150–400)
RBC: 4.91 MIL/uL (ref 3.87–5.11)
RDW: 14 % (ref 11.5–15.5)
WBC: 12.5 10*3/uL — AB (ref 4.0–10.5)

## 2017-05-02 LAB — I-STAT TROPONIN, ED: TROPONIN I, POC: 0 ng/mL (ref 0.00–0.08)

## 2017-05-02 NOTE — ED Triage Notes (Signed)
Patient took a weight loss tablet this morning.  Patient started feeling a rush feeling, fast heart rate, checked blood pressure and it was higher than usual.  Patient also has a headache.

## 2017-05-02 NOTE — ED Provider Notes (Signed)
CSN: [id number]     Arrival date & time 05/02/17  1447 History   None    Chief Complaint  Patient presents with  . Hypertension   (Consider location/radiation/quality/duration/timing/severity/associated sxs/prior Treatment) 61 year old female presents to clinic with a chief complaint of racing heart, hypertension, and headache started earlier today. She took an "all natural" diet and weight loss pill, and her symptoms started shortly thereafter. This first time she ever took the pill, she states that had a high amount of caffeine in it. She is a former smoker as well, denies any chest pain, nausea, vomiting, weakness, or dizziness, or changes in vision. No unilateral numbness, or other complaints.   The history is provided by the patient.  Hypertension  Associated symptoms include headaches. Pertinent negatives include no chest pain.    Past Medical History:  Diagnosis Date  . Anemia   . High cholesterol   . Hyperlipidemia   . Hypertension   . Osteopenia   . Osteopenia   . Seasonal allergies    Past Surgical History:  Procedure Laterality Date  . ABDOMINAL HYSTERECTOMY    . APPENDECTOMY    . COSMETIC SURGERY    . TUBAL LIGATION     Family History  Problem Relation Age of Onset  . Esophageal cancer Mother   . Heart attack Father 42   Social History  Substance Use Topics  . Smoking status: Current Every Day Smoker    Packs/day: 0.25    Years: 40.00    Types: Cigarettes  . Smokeless tobacco: Never Used  . Alcohol use No   OB History    No data available     Review of Systems  Constitutional: Negative.   HENT: Negative.   Respiratory: Negative.   Cardiovascular: Positive for palpitations. Negative for chest pain and leg swelling.  Gastrointestinal: Negative.   Musculoskeletal: Negative.   Neurological: Positive for headaches. Negative for light-headedness and numbness.  Hematological: Negative.     Allergies  Codeine  Home Medications   Prior to Admission  medications   Medication Sig Start Date End Date Taking? Authorizing Provider  Ascorbic Acid (VITAMIN C) 1000 MG tablet Take 1,000 mg by mouth 3 (three) times a week.    [provider]  aspirin 81 MG tablet Take 81 mg by mouth 2 (two) times daily.    [provider]  b complex vitamins tablet Take 1 tablet by mouth 3 (three) times a week.    [provider]  cholecalciferol (VITAMIN D) 1000 UNITS tablet Take 1,000 Units by mouth 3 (three) times a week.    [provider]  ferrous sulfate 325 (65 FE) MG tablet Take 325 mg by mouth 3 (three) times a week.    [provider]  guaiFENesin (ROBITUSSIN) 100 MG/5ML liquid Take 30 mLs by mouth 2 (two) times daily as needed for cough.    [provider]  hydrOXYzine (ATARAX/VISTARIL) 10 MG tablet Take 1 tablet (10 mg total) by mouth every 6 (six) hours as needed for itching. 02/21/16   Montine Circle, PA-C  ibuprofen (ADVIL,MOTRIN) 800 MG tablet Take 800 mg by mouth every other day.    [provider]  loratadine (CLARITIN) 10 MG tablet Take 10 mg by mouth daily as needed for allergies.    [provider]  Metronid-Tetracyc-Bis Milagros Reap MISC Take as directed 11/27/15   Irene Shipper, MD  niacin (SLO-NIACIN) 500 MG tablet Take 500 mg by mouth 2 (two) times daily.  [provider]  omeprazole (PRILOSEC) 20 MG capsule Take 1 capsule (20 mg total) by mouth 2 (two) times daily before a meal. 11/28/15   Irene Shipper, MD   Meds Ordered and Administered this Visit  Medications - No data to display  BP (!) 168/68 (BP Location: Left Arm) Comment: notified rn  Pulse (!) 116 Comment: notified rn  Temp 97.8 F (36.6 C) (Oral)   Resp 16   SpO2 98%  No data found.   Physical Exam  Constitutional: She is oriented to person, place, and time. She appears well-developed and well-nourished. No distress.  HENT:  Head: Normocephalic and atraumatic.  Right Ear: External ear normal.  Left  Ear: External ear normal.  Eyes: Conjunctivae are normal.  Neck: Normal range of motion.  Cardiovascular: Regular rhythm.  Tachycardia present.   Pulmonary/Chest: Effort normal and breath sounds normal.  Neurological: She is alert and oriented to person, place, and time.  Skin: Skin is warm and dry. Capillary refill takes less than 2 seconds. No rash noted. She is not diaphoretic. No erythema.  Psychiatric: She has a normal mood and affect. Her behavior is normal.  Nursing note and vitals reviewed.   Urgent Care Course     ED EKG Date/Time: 05/02/2017 4:06 PM Performed by: Barnet Glasgow Authorized by: Barnet Glasgow   ECG reviewed by ED Physician in the absence of a cardiologist: no   Previous ECG:    Previous ECG:  Compared to current   Comparison ECG info:  September 2016   Similarity:  Changes noted Interpretation:    Interpretation: abnormal   Rate:    ECG rate:  114   ECG rate assessment: tachycardic   Rhythm:    Rhythm: sinus tachycardia   Ectopy:    Ectopy: none   QRS:    QRS axis:  Normal   QRS intervals:  Normal Conduction:    Conduction: normal   ST segments:    ST segments:  Abnormal   Depression:  V3, V4 and V5 T waves:    T waves: normal   Comments:     VR 114 BPM PR 128 ms QRS 80 ms QT/QTc 324/446 ms    (including critical care time)  Labs Review Labs Reviewed - No data to display  Imaging Review No results found.   MDM   1. Hypertension, unspecified type   2. Tachycardia   3. Nonspecific abnormal electrocardiogram (ECG) (EKG)     ST depression in V3, V4, and V5, concern for possible underlying ischemia secondary to her caffeine pill. Believer further work up in the ER is warranted to rule out further cardiac involvement.     Barnet Glasgow, NP 05/02/17 [phone]

## 2017-05-02 NOTE — Discharge Instructions (Signed)
I recommend you go to the ER for further evaluation and management of your condition

## 2017-05-02 NOTE — ED Triage Notes (Signed)
Took a diet pill and started to have irreg heart beat about 1030,  It was about 116  Was seen at [facility] and had an ekg and it was different than one she had in 2016 so she was sent to er for work up, states her legs felt heavy  And her cp c omes and goes

## 2017-05-03 ENCOUNTER — Other Ambulatory Visit: Payer: Self-pay

## 2017-05-03 ENCOUNTER — Emergency Department (HOSPITAL_COMMUNITY)
Admission: EM | Admit: 2017-05-03 | Discharge: 2017-05-03 | Disposition: A | Discharge status: Home / Self Care | Payer: Non-veteran care | Attending: Emergency Medicine | Admitting: Emergency Medicine

## 2017-05-03 DIAGNOSIS — T887XXA Unspecified adverse effect of drug or medicament, initial encounter: Secondary | ICD-10-CM

## 2017-05-03 DIAGNOSIS — R002 Palpitations: Secondary | ICD-10-CM

## 2017-05-03 DIAGNOSIS — R079 Chest pain, unspecified: Secondary | ICD-10-CM

## 2017-05-03 NOTE — Discharge Instructions (Signed)
Please stop the diet pill. Consider seeing the heart specialist for the off and on chest pain.  Please return to the ER if you have worsening chest pain, shortness of breath, pain radiating to your jaw, shoulder, or back, sweats or fainting. Otherwise see the Cardiologist or your primary care doctor as requested.

## 2017-05-03 NOTE — ED Provider Notes (Signed)
[facility] DEPT Provider Note   CSN: [id number] Arrival date & time: 05/02/17  1616   By signing my name below, I, Eunice Blase, attest that this documentation has been prepared under the direction and in the presence of Varney Biles, MD. Electronically signed, Eunice Blase, ED Scribe. 05/03/17. 2:23 AM.   History   Chief Complaint Chief Complaint  Patient presents with  . Irregular Heart Beat   The history is provided by the patient and medical records. No language interpreter was used.  Palpitations   This is a new problem. The current episode started yesterday. The problem occurs constantly. The problem has been gradually improving. The problem is associated with diet pills. On average, each episode lasts 14 hours. Associated symptoms include chest pressure and irregular heartbeat. Pertinent negatives include no chest pain (none currently), no abdominal pain (none currently), no dizziness and no shortness of breath. Risk factors include smoking/tobacco exposure. Her past medical history is significant for anemia.    RASHAUNDA RAHL is a 61 y.o. female with h/o HTN and HLD presenting to the Emergency Department concerning heart palpitations that began after taking a diet pill yesterday. Associated intermittent chest pains reported; no chest pain on evaluation. She states her symptoms have improved on evaluation. She also notes intermittent chest pains prior to this, which radiate to the R shoulder and that last affected her "about 2 days ago". Pt took a 275 mg caffeine pill, drank water and green tea and then began to experience heart palpitations ~10:30 AM yesterday. 1 hour later, her blood pressure was ~160/80, which is high for her. Other blood pressure readings included 192/119 and 177/104. EKG performed at [facility] yesterday when Dr. Valere Dross noted an abnormal EKG as compared with 2016 results. Pt quit tobacco use recently; she states she smoked 10 cigarettes daily. She reports 1  prior quitting attempt leading up to her 2016 EKG. No dizziness. No other complaints at this time.   Past Medical History:  Diagnosis Date  . Anemia   . High cholesterol   . Hyperlipidemia   . Hypertension   . Osteopenia   . Osteopenia   . Seasonal allergies     Patient Active Problem List   Diagnosis Date Noted  . Chest pain 07/23/2015  . Right arm pain 07/23/2015  . Costochondritis, acute 07/23/2015  . Pre-syncope 07/23/2015  . Tobacco abuse 07/23/2015  . Atypical chest pain 07/23/2015  . Pain     Past Surgical History:  Procedure Laterality Date  . ABDOMINAL HYSTERECTOMY    . APPENDECTOMY    . COSMETIC SURGERY    . TUBAL LIGATION      OB History    No data available       Home Medications    Prior to Admission medications   Medication Sig Start Date End Date Taking? Authorizing Provider  Ascorbic Acid (VITAMIN C) 1000 MG tablet Take 1,000 mg by mouth 3 (three) times a week.    [provider]  aspirin 81 MG tablet Take 81 mg by mouth 2 (two) times daily.    [provider]  b complex vitamins tablet Take 1 tablet by mouth 3 (three) times a week.    [provider]  cholecalciferol (VITAMIN D) 1000 UNITS tablet Take 1,000 Units by mouth 3 (three) times a week.    [provider]  ferrous sulfate 325 (65 FE) MG tablet Take 325 mg by mouth 3 (three) times a week.    [provider]  guaiFENesin (ROBITUSSIN) 100 MG/5ML liquid Take 30 mLs by mouth 2 (two) times daily as needed for cough.    [provider]  hydrOXYzine (ATARAX/VISTARIL) 10 MG tablet Take 1 tablet (10 mg total) by mouth every 6 (six) hours as needed for itching. 02/21/16   Montine Circle, PA-C  ibuprofen (ADVIL,MOTRIN) 800 MG tablet Take 800 mg by mouth every other day.    [provider]  loratadine (CLARITIN) 10 MG tablet Take 10 mg by mouth daily as needed for allergies.    [provider]  Metronid-Tetracyc-Bis Milagros Reap MISC  Take as directed 11/27/15   Irene Shipper, MD  niacin (SLO-NIACIN) 500 MG tablet Take 500 mg by mouth 2 (two) times daily.    [provider]  omeprazole (PRILOSEC) 20 MG capsule Take 1 capsule (20 mg total) by mouth 2 (two) times daily before a meal. 11/28/15   Irene Shipper, MD    Family History Family History  Problem Relation Age of Onset  . Esophageal cancer Mother   . Heart attack Father 56    Social History Social History  Substance Use Topics  . Smoking status: Current Every Day Smoker    Packs/day: 0.25    Years: 40.00    Types: Cigarettes  . Smokeless tobacco: Never Used  . Alcohol use No     Allergies   Codeine   Review of Systems Review of Systems  Respiratory: Negative for shortness of breath.   Cardiovascular: Positive for palpitations. Negative for chest pain (none currently).  Gastrointestinal: Negative for abdominal pain (none currently).  Neurological: Negative for dizziness.  All other systems reviewed and are negative.    Physical Exam Updated Vital Signs BP 130/76 (BP Location: Right Arm)   Pulse 83   Temp 97.6 F (36.4 C) (Oral)   Resp 18   SpO2 96%   Physical Exam  Constitutional: She is oriented to person, place, and time. She appears well-developed and well-nourished.  HENT:  Head: Normocephalic.  Eyes: EOM are normal.  Neck: Normal range of motion.  Cardiovascular: Normal rate and regular rhythm.   Pulses:      Radial pulses are 2+ on the right side, and 2+ on the left side.  Radial pulses equal bilaterally. RRR.  Pulmonary/Chest: Effort normal.  Abdominal: She exhibits no distension.  Musculoskeletal: Normal range of motion.  Neurological: She is alert and oriented to person, place, and time.  Psychiatric: She has a normal mood and affect.  Nursing note and vitals reviewed.    ED Treatments / Results  DIAGNOSTIC STUDIES: Oxygen Saturation is 96% on RA, NL by my interpretation.    COORDINATION OF CARE: 2:18  AM-Discussed next steps with pt. Pt verbalized understanding and is agreeable with the plan. Will prepare for d/c with cardiology F/U for intermittent chest pain.   Labs (all labs ordered are listed, but only abnormal results are displayed) Labs Reviewed  BASIC METABOLIC PANEL - Abnormal; Notable for the following:       Result Value   Glucose, Bld 103 (*)    All other components within normal limits  CBC - Abnormal; Notable for the following:    WBC 12.5 (*)    All other components within normal limits  I-STAT TROPOININ, ED    EKG  EKG Interpretation  Date/Time:  Monday May 02 2017 17:59:34 EDT Ventricular Rate:  107 PR Interval:  130 QRS Duration: 78 QT Interval:  322 QTC Calculation: 429 R Axis:  51 Text Interpretation:  Sinus tachycardia Right atrial enlargement Nonspecific ST abnormality Abnormal ECG When compared with ECG of EARLIER SAME DATE No significant change was found Confirmed by Delora Fuel ([id number]) on 05/03/2017 12:43:20 AM       Date: 05/03/2017  Rate: 71  Rhythm: normal sinus rhythm  QRS Axis: normal  Intervals: normal  ST/T Wave abnormalities: normal  Conduction Disutrbances: none  Narrative Interpretation: unremarkable      Radiology Dg Chest 2 View  Result Date: 05/02/2017 CLINICAL DATA:  Irregular heartbeat. EXAM: CHEST  2 VIEW COMPARISON:  07/23/2015 FINDINGS: The lungs are clear without focal pneumonia, edema, pneumothorax or pleural effusion. The cardiopericardial silhouette is within normal limits for size. Hyperexpansion suggests emphysema bones are diffusely demineralized. IMPRESSION: No active cardiopulmonary disease. Electronically Signed   By: Misty Stanley M.D.   On: 05/02/2017 19:23    Procedures Procedures (including critical care time)  Medications Ordered in ED Medications - No data to display   Initial Impression / Assessment and Plan / ED Course  I have reviewed the triage vital signs and the nursing notes.  Pertinent labs  & imaging results that were available during my care of the patient were reviewed by me and considered in my medical decision making (see chart for details).    Pt comes in with cc of palpitations and abnormal EKG. PT took a diet pill with herbal supplements and caffeine, and her symptoms started thereafter. UC ekg showed some non specific ST changes, so she was advised to come here. Pt no longer tachycardic for me. No clear concerning ekg changes. Pt is  having atypical pain off and on - nothing now. She is concerned about ACS. Pt has some cardiac risk factors. I reassured her that the ekg today looks fine, trop x 1 that as drawn 6 hours after onset is neg, so we are not deeply concerned for acs - especially since she has no pain now. She has been given Cards f/u.  Final Clinical Impressions(s) / ED Diagnoses   Final diagnoses:  Palpitations  Medication side effect  Intermittent chest pain    New Prescriptions New Prescriptions   No medications on file  I personally performed the services described in this documentation, which was scribed in my presence. The recorded information has been reviewed and is accurate.    Varney Biles, MD 05/03/17 [phone]

## 2018-05-12 ENCOUNTER — Emergency Department (HOSPITAL_COMMUNITY): Payer: Non-veteran care

## 2018-05-12 ENCOUNTER — Other Ambulatory Visit: Payer: Self-pay

## 2018-05-12 ENCOUNTER — Encounter (HOSPITAL_COMMUNITY): Payer: Self-pay | Admitting: *Deleted

## 2018-05-12 ENCOUNTER — Ambulatory Visit (HOSPITAL_COMMUNITY)
Admission: EM | Admit: 2018-05-12 | Discharge: 2018-05-12 | Disposition: A | Discharge status: Home / Self Care | Payer: Non-veteran care

## 2018-05-12 ENCOUNTER — Emergency Department (HOSPITAL_COMMUNITY)
Admission: EM | Admit: 2018-05-12 | Discharge: 2018-05-12 | Disposition: A | Discharge status: Home / Self Care | Payer: Non-veteran care | Attending: Emergency Medicine | Admitting: Emergency Medicine

## 2018-05-12 DIAGNOSIS — I10 Essential (primary) hypertension: Secondary | ICD-10-CM | POA: Insufficient documentation

## 2018-05-12 DIAGNOSIS — R0789 Other chest pain: Secondary | ICD-10-CM

## 2018-05-12 DIAGNOSIS — K2960 Other gastritis without bleeding: Secondary | ICD-10-CM | POA: Insufficient documentation

## 2018-05-12 DIAGNOSIS — Z7982 Long term (current) use of aspirin: Secondary | ICD-10-CM | POA: Insufficient documentation

## 2018-05-12 DIAGNOSIS — Z9089 Acquired absence of other organs: Secondary | ICD-10-CM | POA: Insufficient documentation

## 2018-05-12 DIAGNOSIS — K7689 Other specified diseases of liver: Secondary | ICD-10-CM | POA: Diagnosis not present

## 2018-05-12 DIAGNOSIS — F17210 Nicotine dependence, cigarettes, uncomplicated: Secondary | ICD-10-CM | POA: Insufficient documentation

## 2018-05-12 DIAGNOSIS — R1013 Epigastric pain: Secondary | ICD-10-CM

## 2018-05-12 DIAGNOSIS — Z79899 Other long term (current) drug therapy: Secondary | ICD-10-CM | POA: Diagnosis not present

## 2018-05-12 LAB — COMPREHENSIVE METABOLIC PANEL
ALBUMIN: 3.7 g/dL (ref 3.5–5.0)
ALK PHOS: 67 U/L (ref 38–126)
ALT: 17 U/L (ref 0–44)
ANION GAP: 10 (ref 5–15)
AST: 26 U/L (ref 15–41)
BUN: 10 mg/dL (ref 8–23)
CALCIUM: 9.5 mg/dL (ref 8.9–10.3)
CO2: 26 mmol/L (ref 22–32)
Chloride: 103 mmol/L (ref 98–111)
Creatinine, Ser: 0.62 mg/dL (ref 0.44–1.00)
GFR calc non Af Amer: >60 mL/min (ref >60)
GLUCOSE: 118 mg/dL — AB (ref 70–99)
POTASSIUM: 4 mmol/L (ref 3.5–5.1)
SODIUM: 139 mmol/L (ref 135–145)
TOTAL PROTEIN: 6.6 g/dL (ref 6.5–8.1)
Total Bilirubin: 0.6 mg/dL (ref 0.3–1.2)

## 2018-05-12 LAB — CBC WITH DIFFERENTIAL/PLATELET
Abs Immature Granulocytes: 0.1 10*3/uL (ref 0.0–0.1)
BASOS PCT: 1 %
Basophils Absolute: 0.1 10*3/uL (ref 0.0–0.1)
EOS ABS: 0.1 10*3/uL (ref 0.0–0.7)
Eosinophils Relative: 1 %
HCT: 42.9 % (ref 36.0–46.0)
HEMOGLOBIN: 13.4 g/dL (ref 12.0–15.0)
IMMATURE GRANULOCYTES: 1 %
LYMPHS ABS: 3.6 10*3/uL (ref 0.7–4.0)
Lymphocytes Relative: 32 %
MCH: 28.1 pg (ref 26.0–34.0)
MCHC: 31.2 g/dL (ref 30.0–36.0)
MCV: 89.9 fL (ref 78.0–100.0)
Monocytes Absolute: 0.6 10*3/uL (ref 0.1–1.0)
Monocytes Relative: 5 %
NEUTROS PCT: 60 %
Neutro Abs: 6.7 10*3/uL (ref 1.7–7.7)
PLATELETS: 348 10*3/uL (ref 150–400)
RBC: 4.77 MIL/uL (ref 3.87–5.11)
RDW: 13.9 % (ref 11.5–15.5)
WBC: 11.1 10*3/uL — ABNORMAL HIGH (ref 4.0–10.5)

## 2018-05-12 LAB — I-STAT TROPONIN, ED
TROPONIN I, POC: 0 ng/mL (ref 0.00–0.08)
Troponin i, poc: 0 ng/mL (ref 0.00–0.08)

## 2018-05-12 LAB — LIPASE, BLOOD: Lipase: 37 U/L (ref 11–51)

## 2018-05-12 MED ORDER — FAMOTIDINE 20 MG PO TABS: 20.0000 mg | ORAL_TABLET | Freq: Two times a day (BID) | ORAL | 0 refills | Status: DC | PRN

## 2018-05-12 MED ORDER — FAMOTIDINE IN NACL 20-0.9 MG/50ML-% IV SOLN
20.0000 mg | Freq: Once | INTRAVENOUS | Status: AC
  Administered 2018-05-12: 20 mg via INTRAVENOUS
  Filled 2018-05-12: qty 50

## 2018-05-12 MED ORDER — GI COCKTAIL ~~LOC~~
30.0000 mL | Freq: Once | ORAL | Status: AC
  Administered 2018-05-12: 30 mL via ORAL
  Filled 2018-05-12: qty 30

## 2018-05-12 MED ORDER — IOHEXOL 300 MG/ML  SOLN
100.0000 mL | Freq: Once | INTRAMUSCULAR | Status: AC | PRN
  Administered 2018-05-12: 100 mL via INTRAVENOUS

## 2018-05-12 MED ORDER — SODIUM CHLORIDE 0.9 % IV BOLUS
1000.0000 mL | Freq: Once | INTRAVENOUS | Status: AC
  Administered 2018-05-12: 1000 mL via INTRAVENOUS

## 2018-05-12 MED ORDER — ESOMEPRAZOLE MAGNESIUM 40 MG PO CPDR: 40.0000 mg | DELAYED_RELEASE_CAPSULE | Freq: Every day | ORAL | 0 refills | Status: DC

## 2018-05-12 NOTE — ED Provider Notes (Signed)
[facility] EMERGENCY DEPARTMENT Provider Note   CSN: [id number] Arrival date & time: 05/12/18  1809     History   Chief Complaint Chief Complaint  Patient presents with  . Chest Pain    HPI Dana Lang is a 62 y.o. female history of high cholesterol, hyperlipidemia, hypertension here presenting with chest pain, abdominal distention.  She states that she has acute onset of substernal chest pain after she ate this evening.  She thought it was gas and took some Alka-Seltzer did not improve.  She also noticed that her abdomen is more distended and she had no bowel movement today.  She does have previous appendectomy and partial hysterectomy but has no history of SBO.   The history is provided by the patient.    Past Medical History:  Diagnosis Date  . Anemia   . High cholesterol   . Hyperlipidemia   . Hypertension   . Osteopenia   . Osteopenia   . Seasonal allergies     Patient Active Problem List   Diagnosis Date Noted  . Chest pain 07/23/2015  . Right arm pain 07/23/2015  . Costochondritis, acute 07/23/2015  . Pre-syncope 07/23/2015  . Tobacco abuse 07/23/2015  . Atypical chest pain 07/23/2015  . Pain     Past Surgical History:  Procedure Laterality Date  . ABDOMINAL HYSTERECTOMY    . APPENDECTOMY    . COSMETIC SURGERY    . TUBAL LIGATION       OB History   None      Home Medications    Prior to Admission medications   Medication Sig Start Date End Date Taking? Authorizing Provider  Ascorbic Acid (VITAMIN C) 1000 MG tablet Take 1,000 mg by mouth daily.    Yes [provider]  aspirin 81 MG tablet Take 81 mg by mouth 2 (two) times daily.   Yes [provider]  Aspirin-Salicylamide-Caffeine (BC HEADACHE POWDER PO) Take 1 packet by mouth every 6 (six) hours as needed (pain).   Yes [provider]  aspirin-sod bicarb-citric acid (ALKA-SELTZER) 325 MG TBEF tablet Take 325 mg by mouth every 6 (six) hours as  needed.   Yes [provider]  b complex vitamins tablet Take 1 tablet by mouth every other day.    Yes [provider]  bisacodyl (DULCOLAX) 5 MG EC tablet Take 5 mg by mouth at bedtime.   Yes [provider]  cholecalciferol (VITAMIN D) 1000 UNITS tablet Take 1,000 Units by mouth daily.    Yes [provider]  ferrous sulfate 325 (65 FE) MG tablet Take 325 mg by mouth every other day.    Yes [provider]  guaiFENesin (ROBITUSSIN) 100 MG/5ML liquid Take 30 mLs by mouth 2 (two) times daily as needed for cough.   Yes [provider]  hydrochlorothiazide (HYDRODIURIL) 25 MG tablet Take 25 mg by mouth daily.   Yes [provider]  hydrOXYzine (ATARAX/VISTARIL) 10 MG tablet Take 1 tablet (10 mg total) by mouth every 6 (six) hours as needed for itching. 02/21/16  Yes Montine Circle, PA-C  ibuprofen (ADVIL,MOTRIN) 800 MG tablet Take 800 mg by mouth every other day.   Yes [provider]  loratadine (CLARITIN) 10 MG tablet Take 10 mg by mouth daily as needed for allergies.   Yes [provider]  naproxen sodium (ALEVE) 220 MG tablet Take 220 mg by mouth daily as needed (pain).   Yes [provider]  niacin (SLO-NIACIN)  500 MG tablet Take 500 mg by mouth 2 (two) times daily.   Yes [provider]  Metronid-Tetracyc-Bis Milagros Reap MISC Take as directed Patient not taking: Reported on 05/12/2018 11/27/15   Irene Shipper, MD  omeprazole (PRILOSEC) 20 MG capsule Take 1 capsule (20 mg total) by mouth 2 (two) times daily before a meal. Patient not taking: Reported on 05/12/2018 11/28/15   Irene Shipper, MD    Family History Family History  Problem Relation Age of Onset  . Esophageal cancer Mother   . Heart attack Father 76    Social History Social History   Tobacco Use  . Smoking status: Current Every Day Smoker    Packs/day: 0.25    Years: 40.00    Pack years: 10.00    Types: Cigarettes  . Smokeless  tobacco: Never Used  Substance Use Topics  . Alcohol use: No  . Drug use: No     Allergies   Codeine   Review of Systems Review of Systems  Cardiovascular: Positive for chest pain.  Gastrointestinal: Positive for abdominal distention.  All other systems reviewed and are negative.    Physical Exam Updated Vital Signs BP 127/85   Pulse 77   Temp 98.9 F (37.2 C) (Oral)   Resp (!) 22   Wt 87.1 kg (192 lb)   SpO2 98%   BMI 34.01 kg/m   Physical Exam  Constitutional: She is oriented to person, place, and time. She appears well-developed and well-nourished.  HENT:  Head: Normocephalic.  Eyes: Pupils are equal, round, and reactive to light.  Neck: Normal range of motion.  Cardiovascular: Regular rhythm and normal pulses.  Pulmonary/Chest: Effort normal and breath sounds normal.  Abdominal:  Distended, + mild epigastric tenderness, no rebound   Musculoskeletal: Normal range of motion.       Right lower leg: Normal.       Left lower leg: Normal.  Neurological: She is alert and oriented to person, place, and time.  Skin: Skin is warm. Capillary refill takes less than 2 seconds.  Psychiatric: She has a normal mood and affect. Her behavior is normal.  Nursing note and vitals reviewed.    ED Treatments / Results  Labs (all labs ordered are listed, but only abnormal results are displayed) Labs Reviewed  COMPREHENSIVE METABOLIC PANEL - Abnormal; Notable for the following components:      Result Value   Glucose, Bld 118 (*)    All other components within normal limits  CBC WITH DIFFERENTIAL/PLATELET - Abnormal; Notable for the following components:   WBC 11.1 (*)    All other components within normal limits  LIPASE, BLOOD  I-STAT TROPONIN, ED  I-STAT TROPONIN, ED    EKG EKG Interpretation  Date/Time:  Friday May 12 2018 18:13:38 EDT Ventricular Rate:  103 PR Interval:  126 QRS Duration: 82 QT Interval:  326 QTC Calculation: 427 R Axis:   62 Text  Interpretation:  Sinus tachycardia Right atrial enlargement Low voltage QRS Borderline ECG No significant change since last tracing Confirmed by Wandra Arthurs [phone]) on 05/12/2018 9:30:36 PM   Radiology Dg Chest 2 View  Result Date: 05/12/2018 CLINICAL DATA:  Chest pain and shortness of breath EXAM: CHEST - 2 VIEW COMPARISON:  May 02, 2017 FINDINGS: No edema or consolidation. Heart size and pulmonary vascularity are normal. No adenopathy. There is degenerative change in the thoracic spine. There is aortic atherosclerosis. IMPRESSION: No edema or consolidation. Heart size normal. There is aortic atherosclerosis. Aortic  Atherosclerosis (ICD10-I70.0). Electronically Signed   By: Lowella Grip III M.D.   On: 05/12/2018 18:59   Ct Abdomen Pelvis W Contrast  Result Date: 05/12/2018 CLINICAL DATA:  Central and epigastric pain for the past hour radiating to the back. EXAM: CT ABDOMEN AND PELVIS WITH CONTRAST TECHNIQUE: Multidetector CT imaging of the abdomen and pelvis was performed using the standard protocol following bolus administration of intravenous contrast. CONTRAST:  137mL OMNIPAQUE IOHEXOL 300 MG/ML  SOLN COMPARISON:  CXR 05/12/2018 FINDINGS: Lower chest: Normal size heart.  No active pulmonary disease. Hepatobiliary: 8 mm hepatic hypodensity adjacent to the gallbladder fundus compatible with a cyst or hemangioma. Physiologically distended gallbladder without stones. Hyperdensities near the gallbladder neck are felt to represent crossing vessels. No space-occupying mass or biliary dilatation of the liver. Pancreas: No ductal dilatation, mass or definite inflammation. Spleen: Normal size spleen without mass. Adrenals/Urinary Tract: No focal adrenal mass. Mild left adrenal gland thickening. Symmetric cortical enhancement of both kidneys without obstructive uropathy. No enhancing mass lesions of the kidneys. No hydroureteronephrosis. The urinary bladder is unremarkable. Stomach/Bowel: Small hiatal  hernia. Decompressed stomach with normal small bowel rotation. Status post appendectomy. Moderate amount of fecal retention within the colon without large bowel inflammation. Vascular/Lymphatic: Nonaneurysmal atherosclerotic aorta with patent branch vessels. No occlusions noted. No significant stenosis. No adenopathy. Reproductive: Hysterectomy.  No adnexal mass. Other: No free air nor free fluid. Musculoskeletal: No acute nor suspicious osseous lesions. Degenerative lumbar facet arthropathy L2 through S1. IMPRESSION: 1. Nonobstructed, nondistended bowel loops without inflammation. No findings for the patient's central chest and epigastric pain. 2. 8 mm hypodensity of the liver adjacent to the gallbladder fundus compatible more commonly representing a cyst or hemangioma. 3. Atherosclerotic nonaneurysmal abdominal aorta without evidence of dissection or aneurysm. Patent branch vessels. 4. Lumbar facet arthropathy. Electronically Signed   By: Ashley Royalty M.D.   On: 05/12/2018 22:22    Procedures Procedures (including critical care time)  Medications Ordered in ED Medications  sodium chloride 0.9 % bolus 1,000 mL (0 mLs Intravenous Stopped 05/12/18 2252)  famotidine (PEPCID) IVPB 20 mg premix (0 mg Intravenous Stopped 05/12/18 2252)  gi cocktail (Maalox,Lidocaine,Donnatal) (30 mLs Oral Given 05/12/18 2145)  iohexol (OMNIPAQUE) 300 MG/ML solution 100 mL (100 mLs Intravenous Contrast Given 05/12/18 2159)     Initial Impression / Assessment and Plan / ED Course  I have reviewed the triage vital signs and the nursing notes.  Pertinent labs & imaging results that were available during my care of the patient were reviewed by me and considered in my medical decision making (see chart for details).     Dana Lang is a 62 y.o. female here with chest pain, epigastric pain, abdominal distention. I think likely reflux vs ACS vs partial SBO. Will get labs, trop x 2, CXR, CT ab/pel. Will give pepcid, GI  cocktail, IVF.   11:03 PM Trop neg x 2. CMP and lipase nl. CT ab/pel showed no SBO, there is small liver cyst that is incidental. Pain resolved with pepcid. Will start on nexium, have her follow up with GI (she goes to [place] in [place]).    Final Clinical Impressions(s) / ED Diagnoses   Final diagnoses:  None    ED Discharge Orders    None       Drenda Freeze, MD 05/12/18 2305

## 2018-05-12 NOTE — ED Triage Notes (Signed)
Pt presents with complaints of chest pain x 1 hour that is radiating to her back.

## 2018-05-12 NOTE — Discharge Instructions (Signed)
Take nexium daily.   Take pepcid twice daily as needed.   See your doctor. Consider endoscopy outpatient   Return to ER if you have worse abdominal pain, chest pain, trouble breathing.

## 2018-05-12 NOTE — ED Provider Notes (Signed)
MSE was initiated and I personally evaluated the patient and placed orders (if any) at  6:19 PM on May 12, 2018.  The patient appears stable so that the remainder of the MSE may be completed by another provider.  Patient placed in Quick Look pathway, seen and evaluated   Chief Complaint: Chest pain, epigastric pain  HPI: Patient presents to ED for evaluation of central chest pain/epigastric pain for the past hour.  Pain radiates to her back.  She states that symptoms began an hour or so after eating watermelon and taking her hypertension medication.  She took Alka-Seltzer which states is somewhat slowly relieving the symptoms.  It is causing her to belch more.  She reports nausea.  No changes to shortness of breath from baseline.  Denies history of MI in the past.  Denies any cough, hemoptysis.  ROS: Chest pain  Physical Exam:   Gen: No distress  Neuro: Awake and Alert  Skin: Warm    Focused Exam: Tenderness to palpation of the sternum and epigastric area.  No rebound or guarding noted. RRR. Lungs CTAB.   Initiation of care has begun. The patient has been counseled on the process, plan, and necessity for staying for the completion/evaluation, and the remainder of the medical screening examination    Delia Heady, PA-C 05/12/18 1820    Tegeler, Gwenyth Allegra, MD 05/13/18 [phone]

## 2018-05-12 NOTE — ED Notes (Signed)
ekg does not meet stemi criteria. Pt transferred to ER by this RN

## 2018-05-12 NOTE — ED Triage Notes (Signed)
The pt brought down from u[facility] with chest pain for one hour  She drove herself to the hospital

## 2018-05-17 ENCOUNTER — Telehealth: Payer: Self-pay | Admitting: Emergency Medicine

## 2018-05-17 NOTE — Telephone Encounter (Signed)
CM received a call from pt regarding prescriptions from ED encounter 05/12/2018.  She reports she unable to pay for them except through the [facility].  She is requesting CM fax them to [phone].  Prescriptions faxed.  No further CM needs noted at this time.

## 2018-06-08 ENCOUNTER — Encounter (HOSPITAL_COMMUNITY): Payer: Self-pay

## 2018-06-08 ENCOUNTER — Ambulatory Visit (HOSPITAL_COMMUNITY)
Admission: EM | Admit: 2018-06-08 | Discharge: 2018-06-08 | Disposition: A | Discharge status: Home / Self Care | Payer: Non-veteran care | Attending: Family Medicine | Admitting: Family Medicine

## 2018-06-08 DIAGNOSIS — F17210 Nicotine dependence, cigarettes, uncomplicated: Secondary | ICD-10-CM | POA: Diagnosis not present

## 2018-06-08 DIAGNOSIS — Z791 Long term (current) use of non-steroidal anti-inflammatories (NSAID): Secondary | ICD-10-CM | POA: Diagnosis not present

## 2018-06-08 DIAGNOSIS — Z79899 Other long term (current) drug therapy: Secondary | ICD-10-CM | POA: Diagnosis not present

## 2018-06-08 DIAGNOSIS — Z800 Family history of malignant neoplasm of digestive organs: Secondary | ICD-10-CM | POA: Insufficient documentation

## 2018-06-08 DIAGNOSIS — Z90710 Acquired absence of both cervix and uterus: Secondary | ICD-10-CM | POA: Diagnosis not present

## 2018-06-08 DIAGNOSIS — M79604 Pain in right leg: Secondary | ICD-10-CM | POA: Diagnosis not present

## 2018-06-08 DIAGNOSIS — Z9889 Other specified postprocedural states: Secondary | ICD-10-CM | POA: Insufficient documentation

## 2018-06-08 DIAGNOSIS — Z885 Allergy status to narcotic agent status: Secondary | ICD-10-CM | POA: Insufficient documentation

## 2018-06-08 DIAGNOSIS — D649 Anemia, unspecified: Secondary | ICD-10-CM | POA: Diagnosis not present

## 2018-06-08 DIAGNOSIS — E7800 Pure hypercholesterolemia, unspecified: Secondary | ICD-10-CM | POA: Diagnosis not present

## 2018-06-08 DIAGNOSIS — M8580 Other specified disorders of bone density and structure, unspecified site: Secondary | ICD-10-CM | POA: Diagnosis not present

## 2018-06-08 DIAGNOSIS — M25561 Pain in right knee: Secondary | ICD-10-CM

## 2018-06-08 DIAGNOSIS — I10 Essential (primary) hypertension: Secondary | ICD-10-CM | POA: Insufficient documentation

## 2018-06-08 DIAGNOSIS — W19XXXA Unspecified fall, initial encounter: Secondary | ICD-10-CM | POA: Diagnosis not present

## 2018-06-08 DIAGNOSIS — R2241 Localized swelling, mass and lump, right lower limb: Secondary | ICD-10-CM | POA: Diagnosis not present

## 2018-06-08 DIAGNOSIS — Z8249 Family history of ischemic heart disease and other diseases of the circulatory system: Secondary | ICD-10-CM | POA: Insufficient documentation

## 2018-06-08 DIAGNOSIS — Z7982 Long term (current) use of aspirin: Secondary | ICD-10-CM | POA: Insufficient documentation

## 2018-06-08 DIAGNOSIS — M7989 Other specified soft tissue disorders: Secondary | ICD-10-CM | POA: Insufficient documentation

## 2018-06-08 LAB — D-DIMER, QUANTITATIVE: D-Dimer, Quant: <0.27 ug/mL-FEU (ref 0.00–0.50)

## 2018-06-08 NOTE — Discharge Instructions (Addendum)
We will send you for a DVT ultrasound. You appointment is at 9 am tomorrow at [facility] long Get there 15 minutes before.  I am also doing a blood test.  If this is negative we will not have to worry about the ultrasound.  I will call you this afternoon. Keep taking your aspirin and tylenol or ibuprofen for the pain.

## 2018-06-08 NOTE — ED Notes (Signed)
Called vascular lab-

## 2018-06-08 NOTE — ED Provider Notes (Addendum)
[name]    CSN: [id number] Arrival date & time: 06/08/18  1101     History   Chief Complaint Chief Complaint  Patient presents with  . Knee Pain    appt 1100    HPI Dana Lang is a 62 y.o. female.   Pt is a 62 year old female past medical history-hypertension, hyperlipidemia, anemia, osteoarthritis.  She presents with 1 day of right lower extremity pain, swelling.  She says the swelling has decreased slightly in her right foot but remained in her right lower leg specifically behind her right knee and calf.  It is tender to touch.  He denies any specific injury to the leg or strenuous activity.  She has been taking her Tylenol with some relief of the pain.  She denies any insect bites or sources of infection.  Denies any fever, chills, night sweats.  She denies any history of blood clots or family history.  She denies any recent traveling.  She does smoke.  She denies any chest pain, shortness of breath or palpitations.  ROS per HPI      Past Medical History:  Diagnosis Date  . Anemia   . High cholesterol   . Hyperlipidemia   . Hypertension   . Osteopenia   . Osteopenia   . Seasonal allergies     Patient Active Problem List   Diagnosis Date Noted  . Chest pain 07/23/2015  . Right arm pain 07/23/2015  . Costochondritis, acute 07/23/2015  . Pre-syncope 07/23/2015  . Tobacco abuse 07/23/2015  . Atypical chest pain 07/23/2015  . Pain     Past Surgical History:  Procedure Laterality Date  . ABDOMINAL HYSTERECTOMY    . APPENDECTOMY    . COSMETIC SURGERY    . TUBAL LIGATION      OB History   None      Home Medications    Prior to Admission medications   Medication Sig Start Date End Date Taking? Authorizing Provider  Ascorbic Acid (VITAMIN C) 1000 MG tablet Take 1,000 mg by mouth daily.     [provider]  aspirin 81 MG tablet Take 81 mg by mouth 2 (two) times daily.    [provider]    Aspirin-Salicylamide-Caffeine (BC HEADACHE POWDER PO) Take 1 packet by mouth every 6 (six) hours as needed (pain).    [provider]  aspirin-sod bicarb-citric acid (ALKA-SELTZER) 325 MG TBEF tablet Take 325 mg by mouth every 6 (six) hours as needed.    [provider]  b complex vitamins tablet Take 1 tablet by mouth every other day.     [provider]  bisacodyl (DULCOLAX) 5 MG EC tablet Take 5 mg by mouth at bedtime.    [provider]  cholecalciferol (VITAMIN D) 1000 UNITS tablet Take 1,000 Units by mouth daily.     [provider]  esomeprazole (NEXIUM) 40 MG capsule Take 1 capsule (40 mg total) by mouth daily. 05/12/18   Drenda Freeze, MD  famotidine (PEPCID) 20 MG tablet Take 1 tablet (20 mg total) by mouth 2 (two) times daily as needed for heartburn or indigestion. 05/12/18   Drenda Freeze, MD  ferrous sulfate 325 (65 FE) MG tablet Take 325 mg by mouth every other day.     [provider]  guaiFENesin (ROBITUSSIN) 100 MG/5ML liquid Take 30 mLs by mouth 2 (two) times daily as needed for cough.    [provider]  hydrochlorothiazide (HYDRODIURIL) 25 MG tablet  Take 25 mg by mouth daily.    [provider]  hydrOXYzine (ATARAX/VISTARIL) 10 MG tablet Take 1 tablet (10 mg total) by mouth every 6 (six) hours as needed for itching. 02/21/16   Montine Circle, PA-C  ibuprofen (ADVIL,MOTRIN) 800 MG tablet Take 800 mg by mouth every other day.    [provider]  loratadine (CLARITIN) 10 MG tablet Take 10 mg by mouth daily as needed for allergies.    [provider]  Metronid-Tetracyc-Bis Milagros Reap MISC Take as directed Patient not taking: Reported on 05/12/2018 11/27/15   Irene Shipper, MD  naproxen sodium (ALEVE) 220 MG tablet Take 220 mg by mouth daily as needed (pain).    [provider]  niacin (SLO-NIACIN) 500 MG tablet Take 500 mg by mouth 2 (two) times daily.    [provider]   omeprazole (PRILOSEC) 20 MG capsule Take 1 capsule (20 mg total) by mouth 2 (two) times daily before a meal. Patient not taking: Reported on 05/12/2018 11/28/15   Irene Shipper, MD    Family History Family History  Problem Relation Age of Onset  . Esophageal cancer Mother   . Heart attack Father 68    Social History Social History   Tobacco Use  . Smoking status: Current Every Day Smoker    Packs/day: 0.25    Years: 40.00    Pack years: 10.00    Types: Cigarettes  . Smokeless tobacco: Never Used  Substance Use Topics  . Alcohol use: No  . Drug use: No     Allergies   Codeine   Review of Systems Review of Systems   Physical Exam Triage Vital Signs ED Triage Vitals  Enc Vitals Group     BP 06/08/18 1125 (!) 154/90     Pulse Rate 06/08/18 1125 88     Resp 06/08/18 1125 18     Temp 06/08/18 1125 98.3 F (36.8 C)     Temp src --      SpO2 --      Weight 06/08/18 1130 295 lb (133.8 kg)     Height --      Head Circumference --      Peak Flow --      Pain Score --      Pain Loc --      Pain Edu? --      Excl. in [place]? --    No data found.  Updated Vital Signs BP (!) 154/90   Pulse 88   Temp 98.3 F (36.8 C)   Resp 18   Wt 295 lb (133.8 kg)   BMI 52.26 kg/m   Visual Acuity Right Eye Distance:   Left Eye Distance:   Bilateral Distance:    Right Eye Near:   Left Eye Near:    Bilateral Near:     Physical Exam  Constitutional: She is oriented to person, place, and time. She appears well-developed and well-nourished.  Very pleasant and in no distress  HENT:  Head: Normocephalic and atraumatic.  Eyes: Pupils are equal, round, and reactive to light.  Neck: Normal range of motion. Neck supple.  Cardiovascular: Normal rate and regular rhythm.  Pulmonary/Chest: Effort normal and breath sounds normal.  Musculoskeletal: She exhibits edema and tenderness. She exhibits no deformity.  Tenderness to palpation of right posterior knee, calf. 1+ edema from  posterior knee extending into lower extremity and right foot.  No obvious erythema, ecchymosis or deformity. Sensation intact. Distal pulse 2+  Neurological:  She is alert and oriented to person, place, and time.  Skin: Skin is warm and dry.  No obvious insect bites or sources of infection.  Psychiatric: She has a normal mood and affect.  Nursing note and vitals reviewed.    UC Treatments / Results  Labs (all labs ordered are listed, but only abnormal results are displayed) Labs Reviewed  D-DIMER, QUANTITATIVE (NOT AT [facility])    EKG None  Radiology No results found.  Procedures Procedures (including critical care time)  Medications Ordered in UC Medications - No data to display  Initial Impression / Assessment and Plan / UC Course  I have reviewed the triage vital signs and the nursing notes.  Pertinent labs & imaging results that were available during my care of the patient were reviewed by me and considered in my medical decision making (see chart for details).     Based on patient presentation will get d dimer. If positive will send for DVT rule out.  Pt d dimer negative. Less likely DVT Most likely muscle strain.  RICE and ibuprofen  for pain and inflammation.   Pt called with results and agreeable to plan.  Final Clinical Impressions(s) / UC Diagnoses   Final diagnoses:  None     Discharge Instructions     We will send you for a DVT ultrasound. You appointment is at 9 am tomorrow at [facility] long Get there 15 minutes before.  I am also doing a blood test.  If this is negative we will not have to worry about the ultrasound.  I will call you this afternoon. Keep taking your aspirin and tylenol or ibuprofen for the pain.     ED Prescriptions    None     Controlled Substance Prescriptions [place] Controlled Substance Registry consulted? Not Applicable        Orvan July, NP 06/08/18 1459

## 2018-06-08 NOTE — ED Notes (Signed)
Spoke to [place] at vascular.  There is an appt at 9am at [facility].  Traci, NP aware of appt made

## 2018-06-08 NOTE — ED Triage Notes (Signed)
Right knee pain..05/19/18 pt fell on her deck.. And may have a insect bite. Right foot swelling

## 2018-06-09 ENCOUNTER — Ambulatory Visit (HOSPITAL_COMMUNITY): Admit: 2018-06-09 | Payer: Non-veteran care

## 2018-08-14 ENCOUNTER — Other Ambulatory Visit: Payer: Self-pay | Admitting: Family

## 2018-08-15 ENCOUNTER — Other Ambulatory Visit: Payer: Self-pay | Admitting: Family

## 2018-08-15 DIAGNOSIS — M7989 Other specified soft tissue disorders: Principal | ICD-10-CM

## 2018-08-15 DIAGNOSIS — M79662 Pain in left lower leg: Secondary | ICD-10-CM

## 2018-08-18 ENCOUNTER — Ambulatory Visit
Admission: RE | Admit: 2018-08-18 | Discharge: 2018-08-18 | Disposition: A | Discharge status: Home / Self Care | Payer: Non-veteran care | Source: Ambulatory Visit | Attending: Family | Admitting: Family

## 2018-08-18 DIAGNOSIS — M7989 Other specified soft tissue disorders: Principal | ICD-10-CM

## 2018-08-18 DIAGNOSIS — M79662 Pain in left lower leg: Secondary | ICD-10-CM

## 2019-03-07 ENCOUNTER — Ambulatory Visit (HOSPITAL_COMMUNITY)
Admission: EM | Admit: 2019-03-07 | Discharge: 2019-03-07 | Disposition: A | Discharge status: Home / Self Care | Payer: Self-pay | Attending: Family Medicine | Admitting: Family Medicine

## 2019-03-07 ENCOUNTER — Encounter (HOSPITAL_COMMUNITY): Payer: Self-pay

## 2019-03-07 ENCOUNTER — Other Ambulatory Visit: Payer: Self-pay

## 2019-03-07 DIAGNOSIS — N390 Urinary tract infection, site not specified: Secondary | ICD-10-CM | POA: Insufficient documentation

## 2019-03-07 LAB — POCT URINALYSIS DIP (DEVICE)
Glucose, UA: 100 mg/dL — AB
Hgb urine dipstick: NEGATIVE
Nitrite: NEGATIVE
Protein, ur: 100 mg/dL — AB
Specific Gravity, Urine: >=1.03 (ref 1.005–1.030)
Urobilinogen, UA: 0.2 mg/dL (ref 0.0–1.0)
pH: 5.5 (ref 5.0–8.0)

## 2019-03-07 LAB — GLUCOSE, CAPILLARY: Glucose-Capillary: 112 mg/dL — ABNORMAL HIGH (ref 70–99)

## 2019-03-07 MED ORDER — CEPHALEXIN 500 MG PO CAPS: 500.0000 mg | ORAL_CAPSULE | Freq: Four times a day (QID) | ORAL | 0 refills | Status: AC

## 2019-03-07 NOTE — Discharge Instructions (Signed)
We are treating you for a urinary tract infection Take the Keflex as prescribed Make sure you drink plenty of water Follow-up with your doctor for continued or worsening symptoms.

## 2019-03-07 NOTE — ED Triage Notes (Signed)
Pt states she had been voiding a lot this has been going on for 2 weeks.  And she has had pressure when voiding. Pt states she just got over a UTI  2 weeks before this one and it has came back again.

## 2019-03-07 NOTE — ED Provider Notes (Signed)
[place]    CSN: [id number] Arrival date & time: 03/07/19  1505     History   Chief Complaint Chief Complaint  Patient presents with  . Appointment    830  . Urinary Tract Infection    HPI Dana Lang is a 63 y.o. female.   Patient is a 63 year old female with past medical history of anemia, high cholesterol, hypertension, allergies.  She presents today with approximately 2 weeks or more of dysuria, urinary incontinence and retention.  Symptoms have been pretty constant. She has been using 5 or more depends a day. This is not typical for her.   Reporting that about 2 weeks ago when the symptoms initially started she was treated with Bactrim.  Reports that she felt better for a couple days and then the symptoms returned.  She has been drinking lots of water. She denies any associated lower abdominal pain, back pain, fevers, nausea or vomiting.  She denies any vaginal discharge or itching.  She had a history of recurrent UTIs when she was younger but none in many years.  ROS per HPI      Past Medical History:  Diagnosis Date  . Anemia   . High cholesterol   . Hyperlipidemia   . Hypertension   . Osteopenia   . Osteopenia   . Seasonal allergies     Patient Active Problem List   Diagnosis Date Noted  . Chest pain 07/23/2015  . Right arm pain 07/23/2015  . Costochondritis, acute 07/23/2015  . Pre-syncope 07/23/2015  . Tobacco abuse 07/23/2015  . Atypical chest pain 07/23/2015  . Pain     Past Surgical History:  Procedure Laterality Date  . ABDOMINAL HYSTERECTOMY    . APPENDECTOMY    . COSMETIC SURGERY    . TUBAL LIGATION      OB History   No obstetric history on file.      Home Medications    Prior to Admission medications   Medication Sig Start Date End Date Taking? Authorizing Provider  Ascorbic Acid (VITAMIN C) 1000 MG tablet Take 1,000 mg by mouth daily.     [provider]  aspirin 81 MG tablet Take 81 mg by mouth 2  (two) times daily.    [provider]  Aspirin-Salicylamide-Caffeine (BC HEADACHE POWDER PO) Take 1 packet by mouth every 6 (six) hours as needed (pain).    [provider]  aspirin-sod bicarb-citric acid (ALKA-SELTZER) 325 MG TBEF tablet Take 325 mg by mouth every 6 (six) hours as needed.    [provider]  b complex vitamins tablet Take 1 tablet by mouth every other day.     [provider]  bisacodyl (DULCOLAX) 5 MG EC tablet Take 5 mg by mouth at bedtime.    [provider]  cephALEXin (KEFLEX) 500 MG capsule Take 1 capsule (500 mg total) by mouth 4 (four) times daily for 7 days. 03/07/19 03/14/19  Loura Halt A, NP  cholecalciferol (VITAMIN D) 1000 UNITS tablet Take 1,000 Units by mouth daily.     [provider]  esomeprazole (NEXIUM) 40 MG capsule Take 1 capsule (40 mg total) by mouth daily. 05/12/18   Drenda Freeze, MD  famotidine (PEPCID) 20 MG tablet Take 1 tablet (20 mg total) by mouth 2 (two) times daily as needed for heartburn or indigestion. 05/12/18   Drenda Freeze, MD  ferrous sulfate 325 (65 FE) MG tablet Take 325 mg by mouth every other day.  [provider]  guaiFENesin (ROBITUSSIN) 100 MG/5ML liquid Take 30 mLs by mouth 2 (two) times daily as needed for cough.    [provider]  hydrochlorothiazide (HYDRODIURIL) 25 MG tablet Take 25 mg by mouth daily.    [provider]  hydrOXYzine (ATARAX/VISTARIL) 10 MG tablet Take 1 tablet (10 mg total) by mouth every 6 (six) hours as needed for itching. 02/21/16   Montine Circle, PA-C  ibuprofen (ADVIL,MOTRIN) 800 MG tablet Take 800 mg by mouth every other day.    [provider]  loratadine (CLARITIN) 10 MG tablet Take 10 mg by mouth daily as needed for allergies.    [provider]  Metronid-Tetracyc-Bis Milagros Reap MISC Take as directed Patient not taking: Reported on 05/12/2018 11/27/15   Irene Shipper, MD  naproxen sodium (ALEVE) 220  MG tablet Take 220 mg by mouth daily as needed (pain).    [provider]  niacin (SLO-NIACIN) 500 MG tablet Take 500 mg by mouth 2 (two) times daily.    [provider]  omeprazole (PRILOSEC) 20 MG capsule Take 1 capsule (20 mg total) by mouth 2 (two) times daily before a meal. Patient not taking: Reported on 05/12/2018 11/28/15   Irene Shipper, MD    Family History Family History  Problem Relation Age of Onset  . Esophageal cancer Mother   . Heart attack Father 61    Social History Social History   Tobacco Use  . Smoking status: Current Every Day Smoker    Packs/day: 0.25    Years: 40.00    Pack years: 10.00    Types: Cigarettes  . Smokeless tobacco: Never Used  Substance Use Topics  . Alcohol use: No  . Drug use: No     Allergies   Codeine   Review of Systems Review of Systems   Physical Exam Triage Vital Signs ED Triage Vitals  Enc Vitals Group     BP 03/07/19 0845 (!) 148/92     Pulse Rate 03/07/19 0845 100     Resp 03/07/19 0845 18     Temp 03/07/19 0845 98.6 F (37 C)     Temp Source 03/07/19 0845 Oral     SpO2 03/07/19 0845 98 %     Weight 03/07/19 0847 204 lb (92.5 kg)     Height --      Head Circumference --      Peak Flow --      Pain Score 03/07/19 0847 6     Pain Loc --      Pain Edu? --      Excl. in [place]? --    No data found.  Updated Vital Signs BP (!) 148/92 (BP Location: Right Arm)   Pulse 100   Temp 98.6 F (37 C) (Oral)   Resp 18   Wt 204 lb (92.5 kg)   SpO2 98%   BMI 36.14 kg/m   Visual Acuity Right Eye Distance:   Left Eye Distance:   Bilateral Distance:    Right Eye Near:   Left Eye Near:    Bilateral Near:     Physical Exam Vitals signs and nursing note reviewed.  Constitutional:      General: She is not in acute distress.    Appearance: Normal appearance. She is not ill-appearing, toxic-appearing or diaphoretic.  HENT:     Head: Normocephalic.     Nose: Nose normal.     Mouth/Throat:      Pharynx: Oropharynx is clear.  Eyes:     Conjunctiva/sclera: Conjunctivae normal.  Neck:     Musculoskeletal: Normal range of motion.  Pulmonary:     Effort: Pulmonary effort is normal.  Abdominal:     Palpations: Abdomen is soft.     Tenderness: There is no abdominal tenderness.  Musculoskeletal: Normal range of motion.  Skin:    General: Skin is warm and dry.     Findings: No rash.  Neurological:     Mental Status: She is alert.  Psychiatric:        Mood and Affect: Mood normal.      UC Treatments / Results  Labs (all labs ordered are listed, but only abnormal results are displayed) Labs Reviewed  GLUCOSE, CAPILLARY - Abnormal; Notable for the following components:      Result Value   Glucose-Capillary 112 (*)    All other components within normal limits  POCT URINALYSIS DIP (DEVICE) - Abnormal; Notable for the following components:   Glucose, UA 100 (*)    Bilirubin Urine SMALL (*)    Ketones, ur TRACE (*)    Protein, ur 100 (*)    Leukocytes,Ua TRACE (*)    All other components within normal limits  URINE CULTURE  CBG MONITORING, ED    EKG None  Radiology No results found.  Procedures Procedures (including critical care time)  Medications Ordered in UC Medications - No data to display  Initial Impression / Assessment and Plan / UC Course  I have reviewed the triage vital signs and the nursing notes.  Pertinent labs & imaging results that were available during my care of the patient were reviewed by me and considered in my medical decision making (see chart for details).    UTI  We will go ahead and treat for UTI based on symptoms and urine results. Sending for culture She could have had some resistance to the bactrim  We will try keflex and treat for more complicated UTI.  Otherwise she appears well on exam She is afebrile and nontoxic or ill-appearing. Instructed to increase her water intake Check your blood sugar due to glucose in urine which  was 112. Instructed that if her symptoms continue despite treatment she will need to follow-up. Final Clinical Impressions(s) / UC Diagnoses   Final diagnoses:  Lower urinary tract infectious disease     Discharge Instructions     We are treating you for a urinary tract infection Take the Keflex as prescribed Make sure you drink plenty of water Follow-up with your doctor for continued or worsening symptoms.    ED Prescriptions    Medication Sig Dispense Auth. Provider   cephALEXin (KEFLEX) 500 MG capsule Take 1 capsule (500 mg total) by mouth 4 (four) times daily for 7 days. 28 capsule Loura Halt A, NP     Controlled Substance Prescriptions [place] Controlled Substance Registry consulted? Not Applicable   Orvan July, NP 03/07/19 1028

## 2019-03-08 LAB — URINE CULTURE

## 2020-02-19 ENCOUNTER — Other Ambulatory Visit: Payer: Self-pay | Admitting: Family

## 2020-04-17 ENCOUNTER — Other Ambulatory Visit: Payer: Self-pay | Admitting: General Practice

## 2020-04-17 DIAGNOSIS — Z1231 Encounter for screening mammogram for malignant neoplasm of breast: Secondary | ICD-10-CM

## 2020-04-17 DIAGNOSIS — Z780 Asymptomatic menopausal state: Secondary | ICD-10-CM

## 2020-06-12 ENCOUNTER — Ambulatory Visit
Admission: RE | Admit: 2020-06-12 | Discharge: 2020-06-12 | Disposition: A | Discharge status: Home / Self Care | Payer: Medicare Other | Source: Ambulatory Visit | Attending: General Practice | Admitting: General Practice

## 2020-06-12 ENCOUNTER — Ambulatory Visit
Admission: RE | Admit: 2020-06-12 | Discharge: 2020-06-12 | Disposition: A | Discharge status: Home / Self Care | Payer: Non-veteran care | Source: Ambulatory Visit | Attending: General Practice | Admitting: General Practice

## 2020-06-12 DIAGNOSIS — Z780 Asymptomatic menopausal state: Secondary | ICD-10-CM

## 2020-06-12 DIAGNOSIS — Z1231 Encounter for screening mammogram for malignant neoplasm of breast: Secondary | ICD-10-CM

## 2020-07-04 ENCOUNTER — Other Ambulatory Visit: Payer: Self-pay | Admitting: General Practice

## 2020-07-11 ENCOUNTER — Encounter: Payer: Self-pay | Admitting: Family

## 2020-07-11 ENCOUNTER — Ambulatory Visit: Payer: Self-pay

## 2020-07-11 ENCOUNTER — Ambulatory Visit (INDEPENDENT_AMBULATORY_CARE_PROVIDER_SITE_OTHER): Payer: Medicare Other | Admitting: Family

## 2020-07-11 DIAGNOSIS — M25561 Pain in right knee: Secondary | ICD-10-CM

## 2020-07-16 ENCOUNTER — Other Ambulatory Visit: Payer: Self-pay | Admitting: General Practice

## 2020-07-18 DIAGNOSIS — M25561 Pain in right knee: Secondary | ICD-10-CM | POA: Diagnosis not present

## 2020-07-18 MED ORDER — LIDOCAINE HCL 1 % IJ SOLN
5.0000 mL | INTRAMUSCULAR | Status: AC | PRN
  Administered 2020-07-18: 5 mL

## 2020-07-18 MED ORDER — METHYLPREDNISOLONE ACETATE 40 MG/ML IJ SUSP
40.0000 mg | INTRAMUSCULAR | Status: AC | PRN
  Administered 2020-07-18: 40 mg via INTRA_ARTICULAR

## 2020-07-18 NOTE — Progress Notes (Signed)
Office Visit Note   Patient: Dana Lang           Date of Birth: 10/22/1956           MRN: [id number] Visit Date: 07/11/2020              Requested by: Center, [facility] [address] [place],  [place] [address] PCP: [place]  No chief complaint on file.     HPI: Patient is a 64 year old woman who presents today for initial evaluation of right knee pain.  She has a history of osteoarthritis and has been told she would be a good candidate for total knee arthroplasty.  At this point she is just requesting a Depo-Medrol injection of the right knee he has had these in the past with good interval relief.  She is complaining of medial greater than lateral joint pain.  Mechanical symptoms some popping has not had any falls no swelling or erythema  Assessment & Plan: Visit Diagnoses:  1. Right knee pain, unspecified chronicity     Plan: Depo-Medrol injection of the right knee.  We will look into whether she could have her total knee done with Dr. Sharol Given.  She will follow-up in the office on an as-needed basis  Follow-Up Instructions: No follow-ups on file.   Right Knee Exam   Muscle Strength  The patient has normal right knee strength.  Tenderness  The patient is experiencing tenderness in the medial joint line.  Range of Motion  The patient has normal right knee ROM.  Tests  Varus: negative Valgus: negative  Other  Erythema: absent Effusion: no effusion present      Patient is alert, oriented, no adenopathy, well-dressed, normal affect, normal respiratory effort.   Imaging: No results found. No images are attached to the encounter.  Labs: Lab Results  Component Value Date   REPTSTATUS 03/08/2019 FINAL 03/07/2019   CULT  03/07/2019    Multiple bacterial morphotypes present, none predominant. Suggest appropriate recollection if clinically indicated.     Lab Results  Component Value Date   ALBUMIN 3.7 05/12/2018    No results found  for: MG No results found for: VD25OH  No results found for: PREALBUMIN CBC EXTENDED Latest Ref Rng & Units 05/12/2018 05/02/2017 07/23/2015  WBC 4.0 - 10.5 K/uL 11.1(H) 12.5(H) 8.9  RBC 3.87 - 5.11 MIL/uL 4.77 4.91 4.43  HGB 12.0 - 15.0 g/dL 13.4 14.6 13.0  HCT 36 - 46 % 42.9 44.4 40.3  PLT 150 - 400 K/uL 348 329 293  NEUTROABS 1.7 - 7.7 K/uL 6.7 - -  LYMPHSABS 0.7 - 4.0 K/uL 3.6 - -     There is no height or weight on file to calculate BMI.  Orders:  Orders Placed This Encounter  Procedures  . XR Knee 1-2 Views Right   No orders of the defined types were placed in this encounter.    Procedures: Large Joint Inj: R knee on 07/18/2020 11:37 AM Indications: pain Details: 18 G 1.5 in needle, anteromedial approach Medications: 5 mL lidocaine 1 %; 40 mg methylPREDNISolone acetate 40 MG/ML Consent was given by the patient.      Clinical Data: No additional findings.  ROS:  All other systems negative, except as noted in the HPI. Review of Systems  Constitutional: Negative for chills and fever.  Musculoskeletal: Positive for arthralgias and gait problem.    Objective: Vital Signs: There were no vitals taken for this visit.  Specialty Comments:  No specialty comments available.  PMFS History: Patient Active Problem List   Diagnosis Date Noted  . Chest pain 07/23/2015  . Right arm pain 07/23/2015  . Costochondritis, acute 07/23/2015  . Pre-syncope 07/23/2015  . Tobacco abuse 07/23/2015  . Atypical chest pain 07/23/2015  . Pain    Past Medical History:  Diagnosis Date  . Anemia   . High cholesterol   . Hyperlipidemia   . Hypertension   . Osteopenia   . Osteopenia   . Seasonal allergies     Family History  Problem Relation Age of Onset  . Esophageal cancer Mother   . Heart attack Father 26    Past Surgical History:  Procedure Laterality Date  . ABDOMINAL HYSTERECTOMY    . APPENDECTOMY    . COSMETIC SURGERY    . TUBAL LIGATION     Social History    Occupational History  . Not on file  Tobacco Use  . Smoking status: Current Every Day Smoker    Packs/day: 0.25    Years: 40.00    Pack years: 10.00    Types: Cigarettes  . Smokeless tobacco: Never Used  Substance and Sexual Activity  . Alcohol use: No  . Drug use: No  . Sexual activity: Not on file

## 2020-07-21 ENCOUNTER — Other Ambulatory Visit: Payer: Self-pay | Admitting: General Practice

## 2020-07-22 ENCOUNTER — Telehealth: Payer: Self-pay | Admitting: Family

## 2020-07-22 NOTE — Telephone Encounter (Signed)
Patient called requesting a call back. Patient states she has some medical questions and need only the doctor or PA Erin to call. Patient phone number is [phone].

## 2020-07-23 ENCOUNTER — Telehealth: Payer: Self-pay | Admitting: Family

## 2020-07-23 NOTE — Telephone Encounter (Signed)
E 

## 2020-07-23 NOTE — Telephone Encounter (Signed)
Called, voicemail not set up, can call again

## 2020-07-23 NOTE — Telephone Encounter (Signed)
See below

## 2020-07-24 ENCOUNTER — Other Ambulatory Visit: Payer: Self-pay | Admitting: General Practice

## 2020-07-24 DIAGNOSIS — R531 Weakness: Secondary | ICD-10-CM

## 2020-07-25 NOTE — Telephone Encounter (Signed)
Pt called and would like to have gel injection states that this was discussed at her last office visit and would like to proceed ok to order?

## 2020-08-01 ENCOUNTER — Telehealth: Payer: Self-pay

## 2020-08-01 NOTE — Telephone Encounter (Signed)
Noted  

## 2020-08-01 NOTE — Telephone Encounter (Signed)
-----   Message from Suzan Slick, NP sent at 07/29/2020  8:41 AM EDT ----- Would you mind getting supplemental inj approved

## 2020-08-05 ENCOUNTER — Telehealth: Payer: Self-pay

## 2020-08-05 NOTE — Telephone Encounter (Signed)
Submitted VOB, SynviscOne, right knee. 

## 2020-08-06 ENCOUNTER — Telehealth: Payer: Self-pay | Admitting: Family

## 2020-08-06 NOTE — Telephone Encounter (Signed)
Please see message below for gel injection inquiry

## 2020-08-06 NOTE — Telephone Encounter (Signed)
Talked with patient concerning gel injection.  Advised patient that once gel injection has been approved, she will receive a call to schedule.

## 2020-08-06 NOTE — Telephone Encounter (Signed)
Patient called about an update for gel injection approval. Please call patient at [phone].

## 2020-08-07 ENCOUNTER — Other Ambulatory Visit: Payer: Self-pay

## 2020-08-07 ENCOUNTER — Ambulatory Visit
Admission: RE | Admit: 2020-08-07 | Discharge: 2020-08-07 | Disposition: A | Discharge status: Home / Self Care | Payer: Medicare Other | Source: Ambulatory Visit | Attending: General Practice | Admitting: General Practice

## 2020-08-07 DIAGNOSIS — R531 Weakness: Secondary | ICD-10-CM

## 2020-08-07 MED ORDER — IOPAMIDOL (ISOVUE-300) INJECTION 61%
75.0000 mL | Freq: Once | INTRAVENOUS | Status: AC | PRN
  Administered 2020-08-07: 75 mL via INTRAVENOUS

## 2020-08-22 ENCOUNTER — Telehealth: Payer: Self-pay

## 2020-08-27 ENCOUNTER — Telehealth: Payer: Self-pay | Admitting: Family

## 2020-08-27 ENCOUNTER — Telehealth: Payer: Self-pay

## 2020-08-27 NOTE — Telephone Encounter (Signed)
April This pt had a gel injection auth submitted have we hear back on this yet? And did Dr. Sharol Given fill out a blue sheet on the is pt Malachy Mood? I see where Latanya Presser had advised that she may want to proceed with total knee with Sharol Given bit not that they spoke about it and this was to be scheduled?

## 2020-08-27 NOTE — Telephone Encounter (Signed)
Talked with patient concerning gel injection.  Patient is aware that she is approved for SynviscOne, right knee, but would like to hold off on receiving injection.  Will give the office a call when she is ready to proceed.  Approved, SynviscOne, right knee. [place] Patient will be responsible for 20% OOP. No Co-pay No PA required

## 2020-08-27 NOTE — Telephone Encounter (Signed)
Pt is calling asking about her approval or disapproval for surgery ?   Pt is also asking if she can get another hydrocortisone shot

## 2020-08-27 NOTE — Telephone Encounter (Signed)
Yes, she has been approved.  I will call patient to schedule.

## 2020-09-30 ENCOUNTER — Ambulatory Visit (INDEPENDENT_AMBULATORY_CARE_PROVIDER_SITE_OTHER): Payer: Medicare Other | Admitting: Family

## 2020-09-30 ENCOUNTER — Encounter: Payer: Self-pay | Admitting: Family

## 2020-09-30 DIAGNOSIS — M1711 Unilateral primary osteoarthritis, right knee: Secondary | ICD-10-CM

## 2020-09-30 MED ORDER — LIDOCAINE HCL 1 % IJ SOLN
5.0000 mL | INTRAMUSCULAR | Status: AC | PRN
  Administered 2020-09-30: 5 mL

## 2020-09-30 MED ORDER — METHYLPREDNISOLONE ACETATE 40 MG/ML IJ SUSP
40.0000 mg | INTRAMUSCULAR | Status: AC | PRN
  Administered 2020-09-30: 40 mg via INTRA_ARTICULAR

## 2020-09-30 NOTE — Progress Notes (Signed)
Office Visit Note   Patient: Dana Lang           Date of Birth: 01-26-1956           MRN: [id number] Visit Date: 09/30/2020              Requested by: Center, [facility] [address] [place],  [place] [address] PCP: [place]  Chief Complaint  Patient presents with  . Right Knee - Follow-up    S/p cortisone injection 07/11/20      HPI: The patient is a 64 year old woman seen for evaluation of right knee pain.  Has been following with [place] for osteoarthritis of the right knee her last injection was a little over 3 months ago states her relief wore off about a week ago.  Today she.  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    Assessment & Plan: Visit Diagnoses:  1. Primary osteoarthritis of right knee     Plan: Depo-Medrol injection today.  Patient tolerated this well.  She will follow-up in the office as needed  Follow-Up Instructions: Return if symptoms worsen or fail to improve.   Right Knee Exam   Muscle Strength  The patient has normal right knee strength.  Tenderness  The patient is experiencing tenderness in the medial joint line.  Range of Motion  The patient has normal right knee ROM.  Tests  Varus: negative Valgus: negative  Other  Erythema: absent Swelling: mild Effusion: no effusion present      Patient is alert, oriented, no adenopathy, well-dressed, normal affect, normal respiratory effort.   Imaging: No results found. No images are attached to the encounter.  Labs: Lab Results  Component Value Date   REPTSTATUS 03/08/2019 FINAL 03/07/2019   CULT  03/07/2019    Multiple bacterial morphotypes present, none predominant. Suggest appropriate recollection if clinically indicated.     Lab Results  Component Value Date    ALBUMIN 3.7 05/12/2018    No results found for: MG No results found for: VD25OH  No results found for: PREALBUMIN CBC EXTENDED Latest Ref Rng & Units 05/12/2018 05/02/2017 07/23/2015  WBC 4.0 - 10.5 K/uL 11.1(H) 12.5(H) 8.9  RBC 3.87 - 5.11 MIL/uL 4.77 4.91 4.43  HGB 12.0 - 15.0 g/dL 13.4 14.6 13.0  HCT 36 - 46 % 42.9 44.4 40.3  PLT 150 - 400 K/uL 348 329 293  NEUTROABS 1.7 - 7.7 K/uL 6.7 - -  LYMPHSABS 0.7 - 4.0 K/uL 3.6 - -     There is no height or weight on file to calculate BMI.  Orders:  No orders of the defined types were placed in this encounter.  No orders of the defined types were placed in this encounter.    Procedures: Large Joint Inj: R knee on 09/30/2020 10:29 AM Indications: pain Details: 18 G 1.5 in needle, anteromedial approach Medications: 5 mL lidocaine 1 %; 40 mg methylPREDNISolone acetate 40 MG/ML Consent was given by the patient.      Clinical Data: No additional findings.  ROS:  All other systems negative, except as noted in the HPI. Review of Systems  Constitutional: Negative for chills and fever.  Musculoskeletal: Positive for arthralgias and gait problem. Negative for myalgias.    Objective:  Vital Signs: There were no vitals taken for this visit.  Specialty Comments:  No specialty comments available.  PMFS History: Patient Active Problem List   Diagnosis Date Noted  . Chest pain 07/23/2015  . Right arm pain 07/23/2015  . Costochondritis, acute 07/23/2015  . Pre-syncope 07/23/2015  . Tobacco abuse 07/23/2015  . Atypical chest pain 07/23/2015  . Pain    Past Medical History:  Diagnosis Date  . Anemia   . High cholesterol   . Hyperlipidemia   . Hypertension   . Osteopenia   . Osteopenia   . Seasonal allergies     Family History  Problem Relation Age of Onset  . Esophageal cancer Mother   . Heart attack Father 37    Past Surgical History:  Procedure Laterality Date  . ABDOMINAL HYSTERECTOMY    . APPENDECTOMY    .  COSMETIC SURGERY    . TUBAL LIGATION     Social History   Occupational History  . Not on file  Tobacco Use  . Smoking status: Current Every Day Smoker    Packs/day: 0.25    Years: 40.00    Pack years: 10.00    Types: Cigarettes  . Smokeless tobacco: Never Used  Substance and Sexual Activity  . Alcohol use: No  . Drug use: No  . Sexual activity: Not on file

## 2020-10-02 ENCOUNTER — Other Ambulatory Visit: Payer: Self-pay | Admitting: Neurosurgery

## 2020-10-02 DIAGNOSIS — D329 Benign neoplasm of meninges, unspecified: Secondary | ICD-10-CM

## 2020-10-23 ENCOUNTER — Other Ambulatory Visit: Payer: Self-pay

## 2020-10-23 ENCOUNTER — Ambulatory Visit
Admission: RE | Admit: 2020-10-23 | Discharge: 2020-10-23 | Disposition: A | Discharge status: Home / Self Care | Payer: Medicare Other | Source: Ambulatory Visit | Attending: Neurosurgery | Admitting: Neurosurgery

## 2020-10-23 DIAGNOSIS — D329 Benign neoplasm of meninges, unspecified: Secondary | ICD-10-CM

## 2020-10-23 MED ORDER — IOPAMIDOL (ISOVUE-300) INJECTION 61%
75.0000 mL | Freq: Once | INTRAVENOUS | Status: AC | PRN
  Administered 2020-10-23: 75 mL via INTRAVENOUS

## 2020-10-30 DIAGNOSIS — I10 Essential (primary) hypertension: Secondary | ICD-10-CM | POA: Diagnosis present

## 2020-12-12 ENCOUNTER — Other Ambulatory Visit: Payer: Self-pay | Admitting: Neurosurgery

## 2020-12-12 DIAGNOSIS — D329 Benign neoplasm of meninges, unspecified: Secondary | ICD-10-CM

## 2020-12-25 ENCOUNTER — Encounter: Payer: Self-pay | Admitting: Orthopedic Surgery

## 2020-12-25 ENCOUNTER — Ambulatory Visit (INDEPENDENT_AMBULATORY_CARE_PROVIDER_SITE_OTHER): Payer: Medicare Other | Admitting: Physician Assistant

## 2020-12-25 DIAGNOSIS — M1711 Unilateral primary osteoarthritis, right knee: Secondary | ICD-10-CM | POA: Diagnosis not present

## 2020-12-25 MED ORDER — METHYLPREDNISOLONE ACETATE 40 MG/ML IJ SUSP
40.0000 mg | INTRAMUSCULAR | Status: AC | PRN
  Administered 2020-12-25: 40 mg via INTRA_ARTICULAR

## 2020-12-25 MED ORDER — LIDOCAINE HCL 1 % IJ SOLN
5.0000 mL | INTRAMUSCULAR | Status: AC | PRN
  Administered 2020-12-25: 5 mL

## 2020-12-25 NOTE — Progress Notes (Signed)
Office Visit Note   Patient: Dana Lang           Date of Birth: Mar 04, 1956           MRN: [id number] Visit Date: 12/25/2020              Requested by: Center, [facility] [address] [place],  [place] [address] PCP: [place]  Chief Complaint  Patient presents with  . Right Knee - Follow-up    S/p cortisone injection right knee 09/30/20      HPI: Patient is a pleasant 65 year old woman with a history of right knee arthritis.  She comes in every 3 months or so to have an injection.  She finds these helpful.  She is anticipating surgery on her brain in the next few weeks to remove the meningioma.  Assessment & Plan: Visit Diagnoses: No diagnosis found.  Plan: Patient was injected without difficulty.  She may follow-up as needed for another injection.  She briefly mentioned that she thought she had been approved for viscosupplementation but was told not to have it until after her surgery  Follow-Up Instructions: No follow-ups on file.   Ortho Exam  Patient is alert, oriented, no adenopathy, well-dressed, normal affect, normal respiratory effort. Examination demonstrates no effusion no cellulitis no erythema.  She does have tenderness along the medial lateral joint line.  Imaging: No results found. No images are attached to the encounter.  Labs: Lab Results  Component Value Date   REPTSTATUS 03/08/2019 FINAL 03/07/2019   CULT  03/07/2019    Multiple bacterial morphotypes present, none predominant. Suggest appropriate recollection if clinically indicated.     Lab Results  Component Value Date   ALBUMIN 3.7 05/12/2018    No results found for: MG No results found for: VD25OH  No results found for: PREALBUMIN CBC EXTENDED Latest Ref Rng & Units 05/12/2018 05/02/2017 07/23/2015  WBC 4.0 - 10.5 K/uL 11.1(H) 12.5(H) 8.9  RBC 3.87 - 5.11 MIL/uL 4.77 4.91 4.43  HGB 12.0 - 15.0 g/dL 13.4 14.6 13.0  HCT 36.0 - 46.0 % 42.9 44.4 40.3  PLT 150 - 400 K/uL  348 329 293  NEUTROABS 1.7 - 7.7 K/uL 6.7 - -  LYMPHSABS 0.7 - 4.0 K/uL 3.6 - -     There is no height or weight on file to calculate BMI.  Orders:  No orders of the defined types were placed in this encounter.  No orders of the defined types were placed in this encounter.    Procedures: Large Joint Inj: R knee on 12/25/2020 12:55 PM Indications: pain and diagnostic evaluation Details: 22 G 1.5 in needle  Arthrogram: No  Medications: 40 mg methylPREDNISolone acetate 40 MG/ML; 5 mL lidocaine 1 % Outcome: tolerated well, no immediate complications Procedure, treatment alternatives, risks and benefits explained, specific risks discussed. Consent was given by the patient.      Clinical Data: No additional findings.  ROS:  All other systems negative, except as noted in the HPI. Review of Systems  Objective: Vital Signs: There were no vitals taken for this visit.  Specialty Comments:  No specialty comments available.  PMFS History: Patient Active Problem List   Diagnosis Date Noted  . Chest pain 07/23/2015  . Right arm pain 07/23/2015  . Costochondritis, acute 07/23/2015  . Pre-syncope 07/23/2015  . Tobacco abuse 07/23/2015  . Atypical chest pain 07/23/2015  . Pain    Past Medical History:  Diagnosis Date  . Anemia   .  High cholesterol   . Hyperlipidemia   . Hypertension   . Osteopenia   . Osteopenia   . Seasonal allergies     Family History  Problem Relation Age of Onset  . Esophageal cancer Mother   . Heart attack Father 65    Past Surgical History:  Procedure Laterality Date  . ABDOMINAL HYSTERECTOMY    . APPENDECTOMY    . COSMETIC SURGERY    . TUBAL LIGATION     Social History   Occupational History  . Not on file  Tobacco Use  . Smoking status: Current Every Day Smoker    Packs/day: 0.25    Years: 40.00    Pack years: 10.00    Types: Cigarettes  . Smokeless tobacco: Never Used  Substance and Sexual Activity  . Alcohol use: No  .  Drug use: No  . Sexual activity: Not on file

## 2020-12-30 ENCOUNTER — Other Ambulatory Visit: Payer: Self-pay | Admitting: Neurosurgery

## 2020-12-30 DIAGNOSIS — D329 Benign neoplasm of meninges, unspecified: Secondary | ICD-10-CM

## 2021-01-15 ENCOUNTER — Other Ambulatory Visit: Payer: Self-pay | Admitting: Neurosurgery

## 2021-01-15 DIAGNOSIS — D329 Benign neoplasm of meninges, unspecified: Secondary | ICD-10-CM

## 2021-01-20 ENCOUNTER — Other Ambulatory Visit (HOSPITAL_COMMUNITY): Payer: Self-pay | Admitting: Neurosurgery

## 2021-01-20 ENCOUNTER — Other Ambulatory Visit: Payer: Self-pay | Admitting: Neurosurgery

## 2021-01-20 ENCOUNTER — Other Ambulatory Visit: Payer: Self-pay

## 2021-01-20 ENCOUNTER — Ambulatory Visit: Admission: RE | Admit: 2021-01-20 | Payer: Medicare Other | Source: Ambulatory Visit

## 2021-01-20 DIAGNOSIS — D329 Benign neoplasm of meninges, unspecified: Secondary | ICD-10-CM

## 2021-01-23 ENCOUNTER — Other Ambulatory Visit: Payer: Self-pay

## 2021-01-23 ENCOUNTER — Ambulatory Visit (HOSPITAL_COMMUNITY)
Admission: RE | Admit: 2021-01-23 | Discharge: 2021-01-23 | Disposition: A | Discharge status: Home / Self Care | Payer: Medicare Other | Source: Ambulatory Visit | Attending: Neurosurgery | Admitting: Neurosurgery

## 2021-01-23 DIAGNOSIS — D329 Benign neoplasm of meninges, unspecified: Secondary | ICD-10-CM | POA: Diagnosis present

## 2021-01-23 LAB — POCT I-STAT CREATININE: Creatinine, Ser: 0.9 mg/dL (ref 0.44–1.00)

## 2021-01-23 MED ORDER — IOHEXOL 300 MG/ML  SOLN
75.0000 mL | Freq: Once | INTRAMUSCULAR | Status: AC | PRN
  Administered 2021-01-23: 75 mL via INTRAVENOUS

## 2021-01-27 ENCOUNTER — Encounter (HOSPITAL_COMMUNITY): Payer: Self-pay | Admitting: Neurosurgery

## 2021-01-27 ENCOUNTER — Other Ambulatory Visit: Payer: Self-pay

## 2021-01-27 NOTE — Progress Notes (Addendum)
Ms  Dana Lang denies shortness of breath. Patient reports that she has chest pain for approximately 2 years, Drs are aware, patient said she was told that it was indigestion, acid reducers did not helped the pain , she no longer takes any. Patient denies lightheadedness , shortness of breath, n/v, dizziness when she has pain- pain is located mid to right chest. Mrs. Dana Lang states that the pain score is 7/10, it goes to a 2 when she is Aspirin, which has been on hold since 4/1.  When on aspirin , patient reports that pain is a dull nagging, when not on Aspirin - it is a stronger nagging pain. Ms Dana Lang has not see a cardiologist. Patient reported that Dr.Cabbell told her to ride the pain in chest, it probably will decrease after surgery.  Dana Caldwell, PA-C is reviewing Ms Dana Lang chart.

## 2021-01-27 NOTE — Anesthesia Preprocedure Evaluation (Addendum)
Anesthesia Evaluation  Patient identified by MRN, date of birth, ID band Patient awake    Reviewed: Allergy & Precautions, H&P , NPO status , Patient's Chart, lab work & pertinent test results  Airway Mallampati: II   Neck ROM: full    Dental   Pulmonary former smoker,    breath sounds clear to auscultation       Cardiovascular hypertension,  Rhythm:regular Rate:Normal     Neuro/Psych PSYCHIATRIC DISORDERS Anxiety PTSDmeningioma    GI/Hepatic   Endo/Other    Renal/GU      Musculoskeletal  (+) Arthritis ,   Abdominal   Peds  Hematology   Anesthesia Other Findings   Reproductive/Obstetrics S/p hysterectomy                            Anesthesia Physical Anesthesia Plan  ASA: II  Anesthesia Plan: General   Post-op Pain Management:    Induction: Intravenous  PONV Risk Score and Plan: 3 and Ondansetron, Dexamethasone, Midazolam and Treatment may vary due to age or medical condition  Airway Management Planned: Oral ETT  Additional Equipment: Arterial line  Intra-op Plan:   Post-operative Plan: Extubation in OR  Informed Consent: I have reviewed the patients History and Physical, chart, labs and discussed the procedure including the risks, benefits and alternatives for the proposed anesthesia with the patient or authorized representative who has indicated his/her understanding and acceptance.     Dental advisory given  Plan Discussed with: CRNA, Anesthesiologist and Surgeon  Anesthesia Plan Comments: (PAT note by Karoline Caldwell, PA-C: Patient has a history of recurrent atypical chest pain.  In 2016 she had overnight admission for evaluation of right-sided chest pain.  She was ruled out for ACS, discharge summary stated that her pain was most likely felt to be musculoskeletal/costochondritis.  It was also noted that she had had prior stress testing through the [place], most recent negative  stress test at that time was noted to have been done February 2015.  She has subsequently had multiple ED visits for atypical chest pain/epigastric pain with negative ACS work-ups.  Patient will need day of surgery labs, EKG, and evaluation.  EKG 05/12/2018: Sinus rhythm.  Rate 80.  CT Head 01/23/21: IMPRESSION: 1. Slight increase in size of heterogeneously enhancing mass lesion in the high left frontal lobe, likely meningioma. 2. Stable surrounding vasogenic edema and mass effect on the left lateral ventricle. 3. No new areas of enhancement )       Anesthesia Quick Evaluation

## 2021-01-27 NOTE — Progress Notes (Signed)
Anesthesia Chart Review: Same day workup  Patient has a history of recurrent atypical chest pain.  In 2016 she had overnight admission for evaluation of right-sided chest pain.  She was ruled out for ACS, discharge summary stated that her pain was most likely felt to be musculoskeletal/costochondritis.  It was also noted that she had had prior stress testing through the [place], most recent negative stress test at that time was noted to have been done February 2015.  She has subsequently had multiple ED visits for atypical chest pain/epigastric pain with negative ACS work-ups.  Patient will need day of surgery labs, EKG, and evaluation.  EKG 05/12/2018: Sinus rhythm.  Rate 80.  CT Head 01/23/21: IMPRESSION: 1. Slight increase in size of heterogeneously enhancing mass lesion in the high left frontal lobe, likely meningioma. 2. Stable surrounding vasogenic edema and mass effect on the left lateral ventricle. 3. No new areas of enhancement   Wynonia Musty [facility] Short Stay Center/Anesthesiology Phone [phone] 01/27/2021 3:21 PM

## 2021-01-28 ENCOUNTER — Inpatient Hospital Stay (HOSPITAL_COMMUNITY): Payer: Medicare Other | Admitting: Physician Assistant

## 2021-01-28 ENCOUNTER — Inpatient Hospital Stay (HOSPITAL_COMMUNITY): Payer: Medicare Other

## 2021-01-28 ENCOUNTER — Encounter (HOSPITAL_COMMUNITY): Payer: Self-pay | Admitting: Neurosurgery

## 2021-01-28 ENCOUNTER — Inpatient Hospital Stay (HOSPITAL_COMMUNITY)
Admission: RE | Admit: 2021-01-28 | Discharge: 2021-02-10 | DRG: 025 | Disposition: A | Discharge status: Skilled Nursing Facility | Payer: Medicare Other | Attending: Neurosurgery | Admitting: Neurosurgery

## 2021-01-28 ENCOUNTER — Other Ambulatory Visit: Payer: Self-pay

## 2021-01-28 ENCOUNTER — Encounter (HOSPITAL_COMMUNITY)
Admission: RE | Disposition: A | Discharge status: Skilled Nursing Facility | Payer: Self-pay | Source: Home / Self Care | Attending: Neurosurgery

## 2021-01-28 DIAGNOSIS — Z7982 Long term (current) use of aspirin: Secondary | ICD-10-CM | POA: Diagnosis not present

## 2021-01-28 DIAGNOSIS — G936 Cerebral edema: Secondary | ICD-10-CM | POA: Diagnosis present

## 2021-01-28 DIAGNOSIS — G8929 Other chronic pain: Secondary | ICD-10-CM | POA: Diagnosis present

## 2021-01-28 DIAGNOSIS — Z7983 Long term (current) use of bisphosphonates: Secondary | ICD-10-CM | POA: Diagnosis not present

## 2021-01-28 DIAGNOSIS — Z9889 Other specified postprocedural states: Secondary | ICD-10-CM | POA: Diagnosis not present

## 2021-01-28 DIAGNOSIS — Z87891 Personal history of nicotine dependence: Secondary | ICD-10-CM | POA: Diagnosis not present

## 2021-01-28 DIAGNOSIS — R4702 Dysphasia: Secondary | ICD-10-CM | POA: Diagnosis present

## 2021-01-28 DIAGNOSIS — F209 Schizophrenia, unspecified: Secondary | ICD-10-CM | POA: Diagnosis present

## 2021-01-28 DIAGNOSIS — D329 Benign neoplasm of meninges, unspecified: Secondary | ICD-10-CM | POA: Diagnosis not present

## 2021-01-28 DIAGNOSIS — F4310 Post-traumatic stress disorder, unspecified: Secondary | ICD-10-CM | POA: Diagnosis present

## 2021-01-28 DIAGNOSIS — Z79899 Other long term (current) drug therapy: Secondary | ICD-10-CM

## 2021-01-28 DIAGNOSIS — F410 Panic disorder [episodic paroxysmal anxiety] without agoraphobia: Secondary | ICD-10-CM | POA: Diagnosis present

## 2021-01-28 DIAGNOSIS — R4701 Aphasia: Secondary | ICD-10-CM | POA: Diagnosis present

## 2021-01-28 DIAGNOSIS — K5900 Constipation, unspecified: Secondary | ICD-10-CM | POA: Diagnosis present

## 2021-01-28 DIAGNOSIS — D320 Benign neoplasm of cerebral meninges: Secondary | ICD-10-CM | POA: Diagnosis present

## 2021-01-28 DIAGNOSIS — Z90710 Acquired absence of both cervix and uterus: Secondary | ICD-10-CM

## 2021-01-28 DIAGNOSIS — E785 Hyperlipidemia, unspecified: Secondary | ICD-10-CM | POA: Diagnosis present

## 2021-01-28 DIAGNOSIS — E7800 Pure hypercholesterolemia, unspecified: Secondary | ICD-10-CM | POA: Diagnosis present

## 2021-01-28 DIAGNOSIS — Z20822 Contact with and (suspected) exposure to covid-19: Secondary | ICD-10-CM | POA: Diagnosis present

## 2021-01-28 DIAGNOSIS — M8580 Other specified disorders of bone density and structure, unspecified site: Secondary | ICD-10-CM | POA: Diagnosis present

## 2021-01-28 DIAGNOSIS — L8990 Pressure ulcer of unspecified site, unspecified stage: Secondary | ICD-10-CM | POA: Insufficient documentation

## 2021-01-28 DIAGNOSIS — I10 Essential (primary) hypertension: Secondary | ICD-10-CM | POA: Diagnosis present

## 2021-01-28 HISTORY — DX: Panic disorder (episodic paroxysmal anxiety): F41.0

## 2021-01-28 HISTORY — PX: APPLICATION OF CRANIAL NAVIGATION: SHX6578

## 2021-01-28 HISTORY — DX: Post-traumatic stress disorder, unspecified: F43.10

## 2021-01-28 HISTORY — DX: Schizophrenia, unspecified: F20.9

## 2021-01-28 HISTORY — PX: CRANIOTOMY: SHX93

## 2021-01-28 HISTORY — DX: Personal history of other medical treatment: Z92.89

## 2021-01-28 HISTORY — DX: Unspecified osteoarthritis, unspecified site: M19.90

## 2021-01-28 LAB — CREATININE, SERUM
Creatinine, Ser: 0.49 mg/dL (ref 0.44–1.00)
GFR, Estimated: >60 mL/min (ref >60)

## 2021-01-28 LAB — POCT I-STAT 7, (LYTES, BLD GAS, ICA,H+H)
Acid-Base Excess: 1 mmol/L (ref 0.0–2.0)
Acid-Base Excess: 0 mmol/L (ref 0.0–2.0)
Bicarbonate: 25.8 mmol/L (ref 20.0–28.0)
Bicarbonate: 26.1 mmol/L (ref 20.0–28.0)
Calcium, Ion: 1.25 mmol/L (ref 1.15–1.40)
Calcium, Ion: 1.19 mmol/L (ref 1.15–1.40)
HCT: 34 % — ABNORMAL LOW (ref 36.0–46.0)
HCT: 27 % — ABNORMAL LOW (ref 36.0–46.0)
Hemoglobin: 11.6 g/dL — ABNORMAL LOW (ref 12.0–15.0)
Hemoglobin: 9.2 g/dL — ABNORMAL LOW (ref 12.0–15.0)
O2 Saturation: 99 %
O2 Saturation: 100 %
Patient temperature: 36.4
Patient temperature: 36.2
Potassium: 4.1 mmol/L (ref 3.5–5.1)
Potassium: 3.9 mmol/L (ref 3.5–5.1)
Sodium: 136 mmol/L (ref 135–145)
Sodium: 140 mmol/L (ref 135–145)
TCO2: 27 mmol/L (ref 22–32)
TCO2: 27 mmol/L (ref 22–32)
pCO2 arterial: 40.6 mmHg (ref 32.0–48.0)
pCO2 arterial: 44.5 mmHg (ref 32.0–48.0)
pH, Arterial: 7.408 (ref 7.350–7.450)
pH, Arterial: 7.371 (ref 7.350–7.450)
pO2, Arterial: 119 mmHg — ABNORMAL HIGH (ref 83.0–108.0)
pO2, Arterial: 203 mmHg — ABNORMAL HIGH (ref 83.0–108.0)

## 2021-01-28 LAB — CBC
HCT: 44.1 % (ref 36.0–46.0)
HCT: 31.3 % — ABNORMAL LOW (ref 36.0–46.0)
Hemoglobin: 14.2 g/dL (ref 12.0–15.0)
Hemoglobin: 10.1 g/dL — ABNORMAL LOW (ref 12.0–15.0)
MCH: 29.5 pg (ref 26.0–34.0)
MCH: 29.3 pg (ref 26.0–34.0)
MCHC: 32.2 g/dL (ref 30.0–36.0)
MCHC: 32.3 g/dL (ref 30.0–36.0)
MCV: 91.5 fL (ref 80.0–100.0)
MCV: 90.7 fL (ref 80.0–100.0)
Platelets: 292 10*3/uL (ref 150–400)
Platelets: 233 10*3/uL (ref 150–400)
RBC: 4.82 MIL/uL (ref 3.87–5.11)
RBC: 3.45 MIL/uL — ABNORMAL LOW (ref 3.87–5.11)
RDW: 13.2 % (ref 11.5–15.5)
RDW: 13.2 % (ref 11.5–15.5)
WBC: 7.4 10*3/uL (ref 4.0–10.5)
WBC: 15.1 10*3/uL — ABNORMAL HIGH (ref 4.0–10.5)
nRBC: 0 % (ref 0.0–0.2)
nRBC: 0 % (ref 0.0–0.2)

## 2021-01-28 LAB — BASIC METABOLIC PANEL
Anion gap: 7 (ref 5–15)
BUN: 11 mg/dL (ref 8–23)
CO2: 25 mmol/L (ref 22–32)
Calcium: 9.9 mg/dL (ref 8.9–10.3)
Chloride: 106 mmol/L (ref 98–111)
Creatinine, Ser: 0.52 mg/dL (ref 0.44–1.00)
GFR, Estimated: >60 mL/min (ref >60)
Glucose, Bld: 107 mg/dL — ABNORMAL HIGH (ref 70–99)
Potassium: 3.5 mmol/L (ref 3.5–5.1)
Sodium: 138 mmol/L (ref 135–145)

## 2021-01-28 LAB — TYPE AND SCREEN
ABO/RH(D): O POS
Antibody Screen: NEGATIVE

## 2021-01-28 LAB — ABO/RH: ABO/RH(D): O POS

## 2021-01-28 LAB — MRSA PCR SCREENING: MRSA by PCR: NEGATIVE

## 2021-01-28 LAB — SARS CORONAVIRUS 2 BY RT PCR (HOSPITAL ORDER, PERFORMED IN ~~LOC~~ HOSPITAL LAB): SARS Coronavirus 2: NEGATIVE

## 2021-01-28 SURGERY — CRANIOTOMY TUMOR EXCISION
Anesthesia: General | Site: Head

## 2021-01-28 MED ORDER — THROMBIN 20000 UNITS EX SOLR
CUTANEOUS | Status: AC
  Filled 2021-01-28: qty 20000

## 2021-01-28 MED ORDER — ACETAMINOPHEN 325 MG PO TABS
650.0000 mg | ORAL_TABLET | ORAL | Status: DC | PRN
  Administered 2021-01-30 – 2021-02-05 (×15): 650 mg via ORAL
  Filled 2021-01-28 (×15): qty 2

## 2021-01-28 MED ORDER — ONDANSETRON HCL 4 MG/2ML IJ SOLN: 4.0000 mg | Freq: Four times a day (QID) | INTRAMUSCULAR | Status: DC | PRN

## 2021-01-28 MED ORDER — OXYCODONE HCL 5 MG PO TABS: 5.0000 mg | ORAL_TABLET | Freq: Once | ORAL | Status: DC | PRN

## 2021-01-28 MED ORDER — HEPARIN SODIUM (PORCINE) 5000 UNIT/ML IJ SOLN
5000.0000 [IU] | Freq: Three times a day (TID) | INTRAMUSCULAR | Status: DC
  Administered 2021-01-28 – 2021-02-10 (×38): 5000 [IU] via SUBCUTANEOUS
  Filled 2021-01-28 (×38): qty 1

## 2021-01-28 MED ORDER — DEXAMETHASONE SODIUM PHOSPHATE 10 MG/ML IJ SOLN
INTRAMUSCULAR | Status: AC
  Filled 2021-01-28: qty 1

## 2021-01-28 MED ORDER — BACITRACIN ZINC 500 UNIT/GM EX OINT
TOPICAL_OINTMENT | CUTANEOUS | Status: DC | PRN
  Administered 2021-01-28: 1 via TOPICAL

## 2021-01-28 MED ORDER — FENTANYL CITRATE (PF) 100 MCG/2ML IJ SOLN
INTRAMUSCULAR | Status: DC | PRN
  Administered 2021-01-28: 50 ug via INTRAVENOUS
  Administered 2021-01-28: 150 ug via INTRAVENOUS

## 2021-01-28 MED ORDER — B COMPLEX VITAMINS PO CAPS: 1.0000 | ORAL_CAPSULE | Freq: Every day | ORAL | Status: DC

## 2021-01-28 MED ORDER — LORATADINE 10 MG PO TABS
10.0000 mg | ORAL_TABLET | Freq: Every day | ORAL | Status: DC
  Administered 2021-01-29 – 2021-02-10 (×13): 10 mg via ORAL
  Filled 2021-01-28 (×13): qty 1

## 2021-01-28 MED ORDER — MORPHINE SULFATE (PF) 2 MG/ML IV SOLN: 1.0000 mg | INTRAVENOUS | Status: DC | PRN

## 2021-01-28 MED ORDER — CEFAZOLIN SODIUM-DEXTROSE 2-4 GM/100ML-% IV SOLN
2.0000 g | INTRAVENOUS | Status: AC
  Administered 2021-01-28: 2 g via INTRAVENOUS
  Filled 2021-01-28: qty 100

## 2021-01-28 MED ORDER — CHLORHEXIDINE GLUCONATE 0.12 % MT SOLN
15.0000 mL | Freq: Once | OROMUCOSAL | Status: AC
  Administered 2021-01-28: 15 mL via OROMUCOSAL
  Filled 2021-01-28: qty 15

## 2021-01-28 MED ORDER — PHENYLEPHRINE 40 MCG/ML (10ML) SYRINGE FOR IV PUSH (FOR BLOOD PRESSURE SUPPORT)
PREFILLED_SYRINGE | INTRAVENOUS | Status: DC | PRN
  Administered 2021-01-28: 40 ug via INTRAVENOUS

## 2021-01-28 MED ORDER — SODIUM CHLORIDE 0.9 % IV SOLN: INTRAVENOUS | Status: DC

## 2021-01-28 MED ORDER — FENTANYL CITRATE (PF) 100 MCG/2ML IJ SOLN: 25.0000 ug | INTRAMUSCULAR | Status: DC | PRN

## 2021-01-28 MED ORDER — VITAMIN D 25 MCG (1000 UNIT) PO TABS
2000.0000 [IU] | ORAL_TABLET | Freq: Every day | ORAL | Status: DC
  Administered 2021-01-29 – 2021-02-10 (×13): 2000 [IU] via ORAL
  Filled 2021-01-28 (×15): qty 2

## 2021-01-28 MED ORDER — BACITRACIN ZINC 500 UNIT/GM EX OINT
TOPICAL_OINTMENT | CUTANEOUS | Status: AC
  Filled 2021-01-28: qty 28.35

## 2021-01-28 MED ORDER — NALOXONE HCL 0.4 MG/ML IJ SOLN: 0.0800 mg | INTRAMUSCULAR | Status: DC | PRN

## 2021-01-28 MED ORDER — LIDOCAINE 2% (20 MG/ML) 5 ML SYRINGE
INTRAMUSCULAR | Status: AC
  Filled 2021-01-28: qty 5

## 2021-01-28 MED ORDER — MULTIVITAMINS PO CAPS: 1.0000 | ORAL_CAPSULE | Freq: Every day | ORAL | Status: DC

## 2021-01-28 MED ORDER — FENTANYL CITRATE (PF) 250 MCG/5ML IJ SOLN
INTRAMUSCULAR | Status: AC
  Filled 2021-01-28: qty 5

## 2021-01-28 MED ORDER — ROCURONIUM BROMIDE 10 MG/ML (PF) SYRINGE
PREFILLED_SYRINGE | INTRAVENOUS | Status: AC
  Filled 2021-01-28: qty 10

## 2021-01-28 MED ORDER — ONDANSETRON HCL 4 MG/2ML IJ SOLN
INTRAMUSCULAR | Status: DC | PRN
  Administered 2021-01-28: 4 mg via INTRAVENOUS

## 2021-01-28 MED ORDER — 0.9 % SODIUM CHLORIDE (POUR BTL) OPTIME
TOPICAL | Status: DC | PRN
  Administered 2021-01-28 (×3): 1000 mL

## 2021-01-28 MED ORDER — BISACODYL 5 MG PO TBEC: 5.0000 mg | DELAYED_RELEASE_TABLET | Freq: Every day | ORAL | Status: DC | PRN

## 2021-01-28 MED ORDER — LACTATED RINGERS IV SOLN: INTRAVENOUS | Status: DC | PRN

## 2021-01-28 MED ORDER — CHLORHEXIDINE GLUCONATE CLOTH 2 % EX PADS
6.0000 | MEDICATED_PAD | Freq: Every day | CUTANEOUS | Status: DC
  Administered 2021-01-28 – 2021-01-31 (×4): 6 via TOPICAL

## 2021-01-28 MED ORDER — LIDOCAINE-EPINEPHRINE 0.5 %-1:200000 IJ SOLN
INTRAMUSCULAR | Status: AC
  Filled 2021-01-28: qty 1

## 2021-01-28 MED ORDER — HYDROCHLOROTHIAZIDE 25 MG PO TABS
25.0000 mg | ORAL_TABLET | Freq: Every day | ORAL | Status: DC
  Administered 2021-01-29 – 2021-02-10 (×13): 25 mg via ORAL
  Filled 2021-01-28 (×13): qty 1

## 2021-01-28 MED ORDER — PROPOFOL 10 MG/ML IV BOLUS
INTRAVENOUS | Status: AC
  Filled 2021-01-28: qty 20

## 2021-01-28 MED ORDER — ORAL CARE MOUTH RINSE: 15.0000 mL | Freq: Once | OROMUCOSAL | Status: AC

## 2021-01-28 MED ORDER — PROPOFOL 10 MG/ML IV BOLUS
INTRAVENOUS | Status: DC | PRN
  Administered 2021-01-28: 200 mg via INTRAVENOUS

## 2021-01-28 MED ORDER — SENNA 8.6 MG PO TABS
1.0000 | ORAL_TABLET | Freq: Two times a day (BID) | ORAL | Status: DC
  Administered 2021-01-28 – 2021-02-10 (×21): 8.6 mg via ORAL
  Filled 2021-01-28 (×23): qty 1

## 2021-01-28 MED ORDER — SENNOSIDES-DOCUSATE SODIUM 8.6-50 MG PO TABS: 1.0000 | ORAL_TABLET | Freq: Every evening | ORAL | Status: DC | PRN

## 2021-01-28 MED ORDER — FENTANYL CITRATE (PF) 100 MCG/2ML IJ SOLN
INTRAMUSCULAR | Status: AC
  Administered 2021-01-28: 50 ug via INTRAVENOUS
  Filled 2021-01-28: qty 2

## 2021-01-28 MED ORDER — ASCORBIC ACID 500 MG PO TABS
1000.0000 mg | ORAL_TABLET | Freq: Every day | ORAL | Status: DC
  Administered 2021-01-29 – 2021-02-10 (×13): 1000 mg via ORAL
  Filled 2021-01-28 (×13): qty 2

## 2021-01-28 MED ORDER — LIDOCAINE 2% (20 MG/ML) 5 ML SYRINGE
INTRAMUSCULAR | Status: DC | PRN
  Administered 2021-01-28: 60 mg via INTRAVENOUS

## 2021-01-28 MED ORDER — LEVETIRACETAM IN NACL 500 MG/100ML IV SOLN
500.0000 mg | Freq: Two times a day (BID) | INTRAVENOUS | Status: DC
  Administered 2021-01-28 – 2021-01-30 (×5): 500 mg via INTRAVENOUS
  Filled 2021-01-28 (×5): qty 100

## 2021-01-28 MED ORDER — HEMOSTATIC AGENTS (NO CHARGE) OPTIME
TOPICAL | Status: DC | PRN
  Administered 2021-01-28: 1 via TOPICAL

## 2021-01-28 MED ORDER — PANTOPRAZOLE SODIUM 40 MG IV SOLR
40.0000 mg | Freq: Every day | INTRAVENOUS | Status: DC
  Administered 2021-01-28 – 2021-01-29 (×2): 40 mg via INTRAVENOUS
  Filled 2021-01-28 (×2): qty 40

## 2021-01-28 MED ORDER — ONDANSETRON HCL 4 MG PO TABS: 4.0000 mg | ORAL_TABLET | ORAL | Status: DC | PRN

## 2021-01-28 MED ORDER — MAGNESIUM CITRATE PO SOLN: 1.0000 | Freq: Once | ORAL | Status: DC | PRN

## 2021-01-28 MED ORDER — ASPIRIN EC 81 MG PO TBEC
81.0000 mg | DELAYED_RELEASE_TABLET | Freq: Every day | ORAL | Status: DC
  Administered 2021-01-29 – 2021-02-10 (×13): 81 mg via ORAL
  Filled 2021-01-28 (×14): qty 1

## 2021-01-28 MED ORDER — DEXAMETHASONE 4 MG PO TABS
4.0000 mg | ORAL_TABLET | Freq: Four times a day (QID) | ORAL | Status: AC
  Administered 2021-01-29 – 2021-01-30 (×4): 4 mg via ORAL
  Filled 2021-01-28 (×4): qty 1

## 2021-01-28 MED ORDER — POTASSIUM CHLORIDE IN NACL 20-0.9 MEQ/L-% IV SOLN
INTRAVENOUS | Status: DC
  Filled 2021-01-28 (×3): qty 1000

## 2021-01-28 MED ORDER — ROCURONIUM BROMIDE 10 MG/ML (PF) SYRINGE
PREFILLED_SYRINGE | INTRAVENOUS | Status: DC | PRN
  Administered 2021-01-28: 40 mg via INTRAVENOUS
  Administered 2021-01-28: 60 mg via INTRAVENOUS
  Administered 2021-01-28: 5 mg via INTRAVENOUS

## 2021-01-28 MED ORDER — REMIFENTANIL HCL 1 MG IV SOLR
0.0125 ug/kg/min | INTRAVENOUS | Status: DC
  Administered 2021-01-28: .1 ug/kg/min via INTRAVENOUS
  Administered 2021-01-28: 45.35 ug via INTRAVENOUS
  Administered 2021-01-28: 90.7 ug via INTRAVENOUS
  Filled 2021-01-28 (×2): qty 2000

## 2021-01-28 MED ORDER — FERROUS SULFATE 325 (65 FE) MG PO TABS
325.0000 mg | ORAL_TABLET | ORAL | Status: DC
  Administered 2021-01-29 – 2021-02-10 (×7): 325 mg via ORAL
  Filled 2021-01-28 (×8): qty 1

## 2021-01-28 MED ORDER — DEXAMETHASONE 4 MG PO TABS
4.0000 mg | ORAL_TABLET | Freq: Three times a day (TID) | ORAL | Status: DC
  Administered 2021-01-30 – 2021-02-01 (×5): 4 mg via ORAL
  Filled 2021-01-28 (×5): qty 1

## 2021-01-28 MED ORDER — ROSUVASTATIN CALCIUM 5 MG PO TABS
10.0000 mg | ORAL_TABLET | Freq: Every day | ORAL | Status: DC
  Administered 2021-01-28 – 2021-02-09 (×13): 10 mg via ORAL
  Filled 2021-01-28 (×13): qty 2

## 2021-01-28 MED ORDER — SUGAMMADEX SODIUM 200 MG/2ML IV SOLN
INTRAVENOUS | Status: DC | PRN
  Administered 2021-01-28: 200 mg via INTRAVENOUS

## 2021-01-28 MED ORDER — LIDOCAINE-EPINEPHRINE 0.5 %-1:200000 IJ SOLN
INTRAMUSCULAR | Status: DC | PRN
  Administered 2021-01-28: 10 mL

## 2021-01-28 MED ORDER — DEXAMETHASONE SODIUM PHOSPHATE 10 MG/ML IJ SOLN
INTRAMUSCULAR | Status: DC | PRN
  Administered 2021-01-28: 10 mg via INTRAVENOUS

## 2021-01-28 MED ORDER — LABETALOL HCL 5 MG/ML IV SOLN
INTRAVENOUS | Status: DC | PRN
  Administered 2021-01-28: 5 mg via INTRAVENOUS

## 2021-01-28 MED ORDER — CALCIUM CARB-CHOLECALCIFEROL 1000-800 MG-UNIT PO TABS: 1.0000 | ORAL_TABLET | Freq: Every day | ORAL | Status: DC

## 2021-01-28 MED ORDER — HYDROCODONE-ACETAMINOPHEN 5-325 MG PO TABS
1.0000 | ORAL_TABLET | ORAL | Status: DC | PRN
  Administered 2021-01-28 – 2021-02-09 (×5): 1 via ORAL
  Filled 2021-01-28 (×5): qty 1

## 2021-01-28 MED ORDER — DEXAMETHASONE 4 MG PO TABS
6.0000 mg | ORAL_TABLET | Freq: Four times a day (QID) | ORAL | Status: AC
  Administered 2021-01-28 – 2021-01-29 (×4): 6 mg via ORAL
  Filled 2021-01-28 (×4): qty 2

## 2021-01-28 MED ORDER — LEVETIRACETAM IN NACL 1000 MG/100ML IV SOLN
1000.0000 mg | Freq: Once | INTRAVENOUS | Status: AC
  Administered 2021-01-28: 1000 mg via INTRAVENOUS
  Filled 2021-01-28: qty 100

## 2021-01-28 MED ORDER — ONDANSETRON HCL 4 MG/2ML IJ SOLN
INTRAMUSCULAR | Status: AC
  Filled 2021-01-28: qty 2

## 2021-01-28 MED ORDER — ONDANSETRON HCL 4 MG/2ML IJ SOLN
4.0000 mg | INTRAMUSCULAR | Status: DC | PRN
  Administered 2021-01-29: 4 mg via INTRAVENOUS
  Filled 2021-01-28: qty 2

## 2021-01-28 MED ORDER — ORAL CARE MOUTH RINSE
15.0000 mL | Freq: Two times a day (BID) | OROMUCOSAL | Status: DC
  Administered 2021-01-28 – 2021-02-09 (×24): 15 mL via OROMUCOSAL

## 2021-01-28 MED ORDER — OXYCODONE HCL 5 MG/5ML PO SOLN: 5.0000 mg | Freq: Once | ORAL | Status: DC | PRN

## 2021-01-28 MED ORDER — CHLORHEXIDINE GLUCONATE CLOTH 2 % EX PADS: 6.0000 | MEDICATED_PAD | Freq: Once | CUTANEOUS | Status: DC

## 2021-01-28 MED ORDER — MANNITOL 20 % IV SOLN: INTRAVENOUS | Status: DC | PRN

## 2021-01-28 MED ORDER — ACETAMINOPHEN 650 MG RE SUPP: 650.0000 mg | RECTAL | Status: DC | PRN

## 2021-01-28 MED ORDER — PHENYLEPHRINE 40 MCG/ML (10ML) SYRINGE FOR IV PUSH (FOR BLOOD PRESSURE SUPPORT)
PREFILLED_SYRINGE | INTRAVENOUS | Status: AC
  Filled 2021-01-28: qty 10

## 2021-01-28 MED ORDER — PROMETHAZINE HCL 25 MG PO TABS: 12.5000 mg | ORAL_TABLET | ORAL | Status: DC | PRN

## 2021-01-28 MED ORDER — LABETALOL HCL 5 MG/ML IV SOLN
10.0000 mg | INTRAVENOUS | Status: DC | PRN
  Administered 2021-01-28: 10 mg via INTRAVENOUS
  Filled 2021-01-28: qty 4

## 2021-01-28 MED ORDER — PHENYLEPHRINE HCL-NACL 10-0.9 MG/250ML-% IV SOLN
INTRAVENOUS | Status: DC | PRN
  Administered 2021-01-28: 15 ug/min via INTRAVENOUS

## 2021-01-28 MED ORDER — THROMBIN 20000 UNITS EX SOLR: CUTANEOUS | Status: DC | PRN

## 2021-01-28 SURGICAL SUPPLY — 90 items
APL SKNCLS STERI-STRIP NONHPOA (GAUZE/BANDAGES/DRESSINGS)
BAND INSRT 18 STRL LF DISP RB (MISCELLANEOUS)
BAND RUBBER #18 3X1/16 STRL (MISCELLANEOUS) IMPLANT
BENZOIN TINCTURE PRP APPL 2/3 (GAUZE/BANDAGES/DRESSINGS) IMPLANT
BLADE CLIPPER SURG (BLADE) ×3 IMPLANT
BLADE SAW GIGLI 16 STRL (MISCELLANEOUS) IMPLANT
BLADE SURG 15 STRL LF DISP TIS (BLADE) IMPLANT
BLADE SURG 15 STRL SS (BLADE)
BLADE ULTRA TIP 2M (BLADE) IMPLANT
BNDG GAUZE ELAST 4 BULKY (GAUZE/BANDAGES/DRESSINGS) IMPLANT
BUR ACORN 6.0 PRECISION (BURR) ×3 IMPLANT
BUR MATCHSTICK NEURO 3.0 LAGG (BURR) IMPLANT
BUR SPIRAL ROUTER 2.3 (BUR) IMPLANT
CANISTER SUCT 3000ML PPV (MISCELLANEOUS) ×3 IMPLANT
CARTRIDGE OIL MAESTRO DRILL (MISCELLANEOUS) ×2 IMPLANT
CATH VENTRIC 35X38 W/TROCAR LG (CATHETERS) IMPLANT
CLIP VESOCCLUDE MED 6/CT (CLIP) IMPLANT
CNTNR URN SCR LID CUP LEK RST (MISCELLANEOUS) ×2 IMPLANT
CONT SPEC 4OZ STRL OR WHT (MISCELLANEOUS) ×3
COVER BURR HOLE UNIV 10 (Orthopedic Implant) ×2 IMPLANT
COVER WAND RF STERILE (DRAPES) ×3 IMPLANT
DECANTER SPIKE VIAL GLASS SM (MISCELLANEOUS) ×3 IMPLANT
DIFFUSER DRILL AIR PNEUMATIC (MISCELLANEOUS) ×3 IMPLANT
DRAIN SUBARACHNOID (WOUND CARE) IMPLANT
DRAPE CAMERA VIDEO/LASER (DRAPES) IMPLANT
DRAPE MICROSCOPE LEICA (MISCELLANEOUS) IMPLANT
DRAPE NEUROLOGICAL W/INCISE (DRAPES) ×3 IMPLANT
DRAPE ORTHO SPLIT 77X108 STRL (DRAPES)
DRAPE SURG 17X23 STRL (DRAPES) IMPLANT
DRAPE SURG ORHT 6 SPLT 77X108 (DRAPES) IMPLANT
DRAPE WARM FLUID 44X44 (DRAPES) ×3 IMPLANT
DRSG TELFA 3X8 NADH (GAUZE/BANDAGES/DRESSINGS) ×3 IMPLANT
DURAPREP 6ML APPLICATOR 50/CS (WOUND CARE) ×3 IMPLANT
ELECT REM PT RETURN 9FT ADLT (ELECTROSURGICAL) ×3
ELECTRODE REM PT RTRN 9FT ADLT (ELECTROSURGICAL) ×2 IMPLANT
EVACUATOR 1/8 PVC DRAIN (DRAIN) IMPLANT
EVACUATOR SILICONE 100CC (DRAIN) IMPLANT
FORCEPS BIPOLAR SPETZLER 8 1.0 (NEUROSURGERY SUPPLIES) ×1 IMPLANT
GAUZE 4X4 16PLY RFD (DISPOSABLE) IMPLANT
GAUZE SPONGE 4X4 12PLY STRL (GAUZE/BANDAGES/DRESSINGS) ×3 IMPLANT
GLOVE ECLIPSE 6.5 STRL STRAW (GLOVE) ×3 IMPLANT
GLOVE EXAM NITRILE XL STR (GLOVE) IMPLANT
GOWN STRL REUS W/ TWL LRG LVL3 (GOWN DISPOSABLE) ×4 IMPLANT
GOWN STRL REUS W/ TWL XL LVL3 (GOWN DISPOSABLE) IMPLANT
GOWN STRL REUS W/TWL 2XL LVL3 (GOWN DISPOSABLE) IMPLANT
GOWN STRL REUS W/TWL LRG LVL3 (GOWN DISPOSABLE) ×6
GOWN STRL REUS W/TWL XL LVL3 (GOWN DISPOSABLE)
GRAFT DURAGEN MATRIX 5WX7L (Graft) ×1 IMPLANT
HEMOSTAT SURGICEL 2X14 (HEMOSTASIS) IMPLANT
HOOK DURA 1/2IN (MISCELLANEOUS) ×3 IMPLANT
KIT BASIN OR (CUSTOM PROCEDURE TRAY) ×3 IMPLANT
KIT DRAIN CSF ACCUDRAIN (MISCELLANEOUS) IMPLANT
KIT TURNOVER KIT B (KITS) ×3 IMPLANT
MARKER SPHERE PSV REFLC 13MM (MARKER) ×6 IMPLANT
NDL HYPO 25X1 1.5 SAFETY (NEEDLE) ×2 IMPLANT
NDL SPNL 18GX3.5 QUINCKE PK (NEEDLE) IMPLANT
NEEDLE HYPO 25X1 1.5 SAFETY (NEEDLE) ×3 IMPLANT
NEEDLE SPNL 18GX3.5 QUINCKE PK (NEEDLE) IMPLANT
NS IRRIG 1000ML POUR BTL (IV SOLUTION) ×9 IMPLANT
OIL CARTRIDGE MAESTRO DRILL (MISCELLANEOUS) ×3
PACK CRANIOTOMY CUSTOM (CUSTOM PROCEDURE TRAY) ×3 IMPLANT
PAD DRESSING TELFA 3X8 NADH (GAUZE/BANDAGES/DRESSINGS) IMPLANT
PATTIES SURGICAL .25X.25 (GAUZE/BANDAGES/DRESSINGS) IMPLANT
PATTIES SURGICAL .5 X.5 (GAUZE/BANDAGES/DRESSINGS) IMPLANT
PATTIES SURGICAL .5 X3 (DISPOSABLE) IMPLANT
PATTIES SURGICAL 1/4 X 3 (GAUZE/BANDAGES/DRESSINGS) IMPLANT
PATTIES SURGICAL 1X1 (DISPOSABLE) IMPLANT
PLATE DOUBLE Y CMF 6H (Plate) ×2 IMPLANT
SCREW UNIII AXS SD 1.5X4 (Screw) ×14 IMPLANT
SET CARTRIDGE AND TUBING (SET/KITS/TRAYS/PACK) ×1 IMPLANT
SPECIMEN JAR SMALL (MISCELLANEOUS) IMPLANT
SPONGE NEURO XRAY DETECT 1X3 (DISPOSABLE) IMPLANT
SPONGE SURGIFOAM ABS GEL 100 (HEMOSTASIS) ×4 IMPLANT
STAPLER VISISTAT 35W (STAPLE) ×3 IMPLANT
SUT ETHILON 3 0 FSL (SUTURE) IMPLANT
SUT ETHILON 3 0 PS 1 (SUTURE) IMPLANT
SUT NURALON 4 0 TR CR/8 (SUTURE) ×9 IMPLANT
SUT SILK 0 TIES 10X30 (SUTURE) IMPLANT
SUT STEEL 0 (SUTURE)
SUT STEEL 0 18XMFL TIE 17 (SUTURE) IMPLANT
SUT VIC AB 2-0 CT2 18 VCP726D (SUTURE) ×6 IMPLANT
TIP STANDARD 36KHZ (INSTRUMENTS) ×3
TIP STD 36KHZ (INSTRUMENTS) IMPLANT
TOWEL GREEN STERILE (TOWEL DISPOSABLE) ×3 IMPLANT
TOWEL GREEN STERILE FF (TOWEL DISPOSABLE) ×3 IMPLANT
TRAY FOLEY MTR SLVR 16FR STAT (SET/KITS/TRAYS/PACK) ×3 IMPLANT
TUBE CONNECTING 12X1/4 (SUCTIONS) ×3 IMPLANT
UNDERPAD 30X36 HEAVY ABSORB (UNDERPADS AND DIAPERS) ×3 IMPLANT
WATER STERILE IRR 1000ML POUR (IV SOLUTION) ×3 IMPLANT
WRENCH TORQUE 36KHZ (INSTRUMENTS) ×1 IMPLANT

## 2021-01-28 NOTE — Anesthesia Procedure Notes (Signed)
Arterial Line Insertion Start/End4/03/2021 10:30 AM Performed by: Albertha Ghee, MD, Georgia Duff, CRNA, CRNA  Left, radial was placed Catheter size: 20 G Hand hygiene performed   Attempts: 3 Procedure performed without using ultrasound guided technique. Following insertion, dressing applied and Biopatch. Post procedure assessment: normal  Patient tolerated the procedure well with no immediate complications.

## 2021-01-28 NOTE — H&P (Signed)
BP (!) 171/85   Pulse 99   Temp 98.2 F (36.8 C)   Resp 20   Ht 5\' 1"  (1.549 m)   Wt 90.7 kg   SpO2 98%   BMI 37.79 kg/m  Ms. Rubey comes in today for evaluation of a mass which was found along the midline parasagittal region on a plain head CT.  She had been complaining of headaches and weakness on her right side.  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  She is 65 years of age, weighs 186 pounds, is left handed.  She has no history of substance abuse.  She does not use alcohol.  She was identified as having a left parasagittal meningioma, peptic ulcer disease.  She has undergone surgery for a dog bite in her face, appendicitis, and a hysterectomy.  She takes Rosuvastatin, Hydrocortisone, and another medication that starts with [facility] for hypertension.  Mother died age at 84.  Father died at age 37, esophageal cancer. Present in the family history heart disease also. She started having symptoms, in March of 2021, with weakness and pain, and weakness specifically on the right side.  She says Hydrocortisone relieves the pain.  She has numbness and tingling in the left index finger.     REVIEW OF SYSTEMS :  Positive for hearing loss, sinus problems, hypercholesterolemia, swelling in the feet or hands, osteoarthritis, leg pain with walking, peptic ulcer disease, urinary tract infections, arm weakness, leg weakness, leg pain, joint pain, anemia.     Mrs. Rosana Hoes, when she presented today, was not sure what exactly the meningioma was doing and whether or not it was something that was causing the weakness and the problem she had on the right side.     PHYSICAL EXAMINATION :  She is alert. She is oriented by 4.  She answers all questions appropriately.  She is weak on her right side, 4/5, weaker in the lower extremities than the upper extremities. The uppers are 4/5, the lowers are 4- over 5.  Reflexes are intact.  No clonus.  No Hoffmann sign.   Proprioception is intact.  Pupils equal, round, and reactive to light. She has full extraocular movements.  Speech is clear.  She has fluent. Tongue and uvula are in the midline.     IMAGING :  CT is reviewed. She is unable, due to I believe a feeling of claustrophobia, to undergo an MRI.  She has tried and passed and is unwilling to try again.  CT shows a large 4.5 x 4.5 roughly cm mass with a homogeneous enhancement.  Some hypodense areas within attached to the falx on the left side anterior to the motor strip.  It has the overall appearance of a meningioma, but again you can never define pathology until it is in the pathologist's hands.        I informed Mrs. Genna, in no uncertain terms, that the meningioma is the reason for the symptoms she has on the right side of her body, the tingling and weakness.  She was not really sure that was the case.  She is not sure that she wants to get any kind of operation for this.  She was curious if there were some sort of noninvasive means of getting rid of the tumor.  I informed her that there were not.  I also informed her that since this tumor is symptomatic causing the weakness that I advised her  to have the tumor resected.  She wants to hold off, and wants to at least wait until after Christmas, so she has good holidays.  She also wants a repeat scan, because she wants to make sure that this is not changing in size.  Although I explained that I felt that this was moot, because whether or not it gets bigger or smaller it is already causing her problems. She chuckled and said "Well, I really just want to see. I need more time to think about this."  There is no rush.  She is not going to die because of the tumor.  There is no significant shift.  There is some surrounding edema.  I will see her back in 3 months, and she wants to wait until after the New Year's, and we will have her come back with a new CT scan, but I told her given the fact that her lower  extremities are significantly weak hip flexors, quadriceps, hamstrings, dorsiflexion, and plantar flexors.  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   Mrs. Kintzel comes in for followup.  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  She, at this point, would like to proceed with surgery.  We have been looking at this for about 6 months.  She would like to go in April on the 6th, which is that Wednesday.  Unless I am on call I do not think that will be problematic.  I will do an MRI for the [place] station and we will use stereotactic guidance during the case.  She is alert, oriented by 4.  She answers all questions appropriately.  Memory, language, attention span, and fund of knowledge are normal.  She has an unsteady gait, falling sometimes to the left side.  Some weakness in the right grip.  Otherwise, good strength in the upper extremities.  No drift.  Reflexes 2+ and are intact in the upper and lower extremities.  She has normal muscle tone and bulk.  Risks and benefits, bleeding, infection, paralysis, weakness on the right side, stroke, coma, death, inability to resect the entire tumor all were discussed.  She understands and wishes to proceed.

## 2021-01-28 NOTE — Op Note (Signed)
01/28/2021  4:16 PM  PATIENT:  Dana Lang  65 y.o. female  PRE-OPERATIVE DIAGNOSIS: left parafalcine frontal Meningioma  POST-OPERATIVE DIAGNOSIS: left parafalcine frontal  Meningioma  PROCEDURE:  Procedure(s): 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 APPLICATION OF CRANIAL NAVIGATION  SURGEON: Surgeon(s): Ashok Pall, MD  ASSISTANTS:Ostergard, thomass  ANESTHESIA:   local and general  EBL:  Total I/O In: 3100 [I.V.:2900; IV Piggyback:200] Out: 1000 [Urine:400; Blood:600]  BLOOD ADMINISTERED:none  CELL SAVER GIVEN:none  COUNT:per nursing  DRAINS: none   SPECIMEN:  Source of Specimen:  brain mass  DICTATION: Dana Lang was taken to the operating room, intubated, and placed under a general anestheitc without difficulty. Once adequate anesthesia was obtained I placed her head in a three pin [place] head holder the adApter was connected to the bed so that she was looking up. I attached the brainlab array and started the registration of her head to the cranial navigation system.   Once complete with a good registration her head was shaved, was prepped and was draped in a sterile manner.  I infiltrated the planned incision with lidocaine, and with a 10 Bard Parker incised the scalp. I checked with the navigation and my midline was not reliable. I fashioned a flap crossing the midline to expose the superior sagittal sinus. The dura did tear, but the sinus was intact. The tumor was exposed at its rostral portion.  We, then in a stepwise fashion removed the tumor with the cusa, pituitary Rongeurs, cautery, and suction. We developed a distinct plane between the tumor and the surrounding brain. The capsule was well developed and was helpful with providing traction allowing for dissection and gross total resection. The resection bed oozing was controlled with cautery, irrigation, and gelfoam. I irrigated again then started to close. Dr. Zada Finders  attached plates to the bone flap. We approximated the bone flap with screws. I closed the scalp with vicryl sutures and staples. A sterile dressing was  Applied. She was extubated and taken to the PACU awake, and following commands. PLAN OF CARE: Admit to inpatient   PATIENT DISPOSITION:  PACU - hemodynamically stable.   Delay start of Pharmacological VTE agent (>24hrs) due to surgical blood loss or risk of bleeding:  yes

## 2021-01-28 NOTE — Anesthesia Procedure Notes (Signed)
Procedure Name: Intubation Date/Time: 01/28/2021 11:25 AM Performed by: Georgia Duff, CRNA Pre-anesthesia Checklist: Patient identified, Emergency Drugs available, Suction available and Patient being monitored Patient Re-evaluated:Patient Re-evaluated prior to induction Oxygen Delivery Method: Circle System Utilized Preoxygenation: Pre-oxygenation with 100% oxygen Induction Type: IV induction Ventilation: Mask ventilation without difficulty Laryngoscope Size: Miller and 1 Grade View: Grade I Tube type: Oral Number of attempts: 1 Airway Equipment and Method: Stylet and Oral airway Placement Confirmation: ETT inserted through vocal cords under direct vision,  positive ETCO2 and breath sounds checked- equal and bilateral Secured at: 21 cm Tube secured with: Tape Dental Injury: Teeth and Oropharynx as per pre-operative assessment

## 2021-01-28 NOTE — Transfer of Care (Signed)
Immediate Anesthesia Transfer of Care Note  Patient: Dana Lang  Procedure(s) Performed: 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 (Left Head) APPLICATION OF CRANIAL NAVIGATION (N/A )  Patient Location: PACU  Anesthesia Type:General  Level of Consciousness: drowsy and patient cooperative  Airway & Oxygen Therapy: Patient Spontanous Breathing  Post-op Assessment: Report given to RN and Post -op Vital signs reviewed and stable  Post vital signs: Reviewed and stable  Last Vitals:  Vitals Value Taken Time  BP 115/94 01/28/21 1528  Temp    Pulse 91 01/28/21 1530  Resp 19 01/28/21 1530  SpO2 95 % 01/28/21 1530  Vitals shown include unvalidated device data.  Last Pain:  Vitals:   01/28/21 0842  PainSc: 0-No pain         Complications: No complications documented.

## 2021-01-29 ENCOUNTER — Inpatient Hospital Stay (HOSPITAL_COMMUNITY): Payer: Medicare Other

## 2021-01-29 ENCOUNTER — Encounter (HOSPITAL_COMMUNITY): Payer: Self-pay | Admitting: Neurosurgery

## 2021-01-29 DIAGNOSIS — L8990 Pressure ulcer of unspecified site, unspecified stage: Secondary | ICD-10-CM | POA: Insufficient documentation

## 2021-01-29 LAB — CBC
HCT: 27.9 % — ABNORMAL LOW (ref 36.0–46.0)
Hemoglobin: 8.8 g/dL — ABNORMAL LOW (ref 12.0–15.0)
MCH: 29 pg (ref 26.0–34.0)
MCHC: 31.5 g/dL (ref 30.0–36.0)
MCV: 92.1 fL (ref 80.0–100.0)
Platelets: 232 10*3/uL (ref 150–400)
RBC: 3.03 MIL/uL — ABNORMAL LOW (ref 3.87–5.11)
RDW: 13.3 % (ref 11.5–15.5)
WBC: 14.1 10*3/uL — ABNORMAL HIGH (ref 4.0–10.5)
nRBC: 0 % (ref 0.0–0.2)

## 2021-01-29 LAB — SURGICAL PATHOLOGY

## 2021-01-29 MED ORDER — IOHEXOL 300 MG/ML  SOLN
75.0000 mL | Freq: Once | INTRAMUSCULAR | Status: AC | PRN
  Administered 2021-01-29: 75 mL via INTRAVENOUS

## 2021-01-29 NOTE — Plan of Care (Signed)
  Problem: Education: Goal: Knowledge of the prescribed therapeutic regimen will improve Outcome: Progressing   Problem: Clinical Measurements: Goal: Usual level of consciousness will be regained or maintained. Outcome: Progressing Goal: Neurologic status will improve Outcome: Progressing Goal: Ability to maintain intracranial pressure will improve Outcome: Progressing   Problem: Skin Integrity: Goal: Demonstration of wound healing without infection will improve Outcome: Progressing   Problem: Education: Goal: Knowledge of General Education information will improve Description: Including pain rating scale, medication(s)/side effects and non-pharmacologic comfort measures Outcome: Progressing   Problem: Health Behavior/Discharge Planning: Goal: Ability to manage health-related needs will improve Outcome: Progressing   Problem: Clinical Measurements: Goal: Ability to maintain clinical measurements within normal limits will improve Outcome: Progressing Goal: Will remain free from infection Outcome: Progressing Goal: Diagnostic test results will improve Outcome: Progressing Goal: Respiratory complications will improve Outcome: Progressing Goal: Cardiovascular complication will be avoided Outcome: Progressing   Problem: Activity: Goal: Risk for activity intolerance will decrease Outcome: Progressing   Problem: Nutrition: Goal: Adequate nutrition will be maintained Outcome: Progressing   Problem: Coping: Goal: Level of anxiety will decrease Outcome: Progressing   Problem: Elimination: Goal: Will not experience complications related to bowel motility Outcome: Progressing Goal: Will not experience complications related to urinary retention Outcome: Progressing   Problem: Pain Managment: Goal: General experience of comfort will improve Outcome: Progressing   Problem: Safety: Goal: Ability to remain free from injury will improve Outcome: Progressing   Problem:  Skin Integrity: Goal: Risk for impaired skin integrity will decrease Outcome: Progressing   

## 2021-01-29 NOTE — Progress Notes (Signed)
Patient ID: Dana Lang, female   DOB: 09/25/1956, 64 y.o.   MRN: [id number] BP (!) 153/70   Pulse (!) 131   Temp 98.8 F (37.1 C) (Oral)   Resp (!) 25   Ht 5\' 1"  (1.549 m)   Wt 93.1 kg   SpO2 94%   BMI 38.78 kg/m  Alert and oriented x4, speech is clear  And fluent.  Perrl, full eom Tongue and uvula midline Weak in right lower extremity Wound is clean, dry, no signs of infection

## 2021-01-29 NOTE — Anesthesia Postprocedure Evaluation (Signed)
Anesthesia Post Note  Patient: Dana Lang  Procedure(s) Performed: 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 (Left Head) APPLICATION OF CRANIAL NAVIGATION (N/A )     Patient location during evaluation: PACU Anesthesia Type: General Level of consciousness: awake and alert Pain management: pain level controlled Vital Signs Assessment: post-procedure vital signs reviewed and stable Respiratory status: spontaneous breathing, nonlabored ventilation, respiratory function stable and patient connected to nasal cannula oxygen Cardiovascular status: blood pressure returned to baseline and stable Postop Assessment: no apparent nausea or vomiting Anesthetic complications: no   No complications documented.  Last Vitals:  Vitals:   01/29/21 0700 01/29/21 0757  BP: 127/71   Pulse: (!) 106 (!) 112  Resp: (!) 24 (!) 25  Temp:    SpO2: 93% 94%    Last Pain:  Vitals:   01/29/21 0757  TempSrc:   PainSc: 2     LLE Motor Response: Purposeful movement (01/29/21 0757) LLE Sensation: Full sensation (01/29/21 0757) RLE Motor Response: No movement to painful stimulus (01/29/21 0757) RLE Sensation: Full sensation (01/29/21 0757)      Albertha Ghee S

## 2021-01-30 MED ORDER — LEVETIRACETAM 500 MG PO TABS
500.0000 mg | ORAL_TABLET | Freq: Two times a day (BID) | ORAL | Status: DC
  Administered 2021-01-31 – 2021-02-10 (×22): 500 mg via ORAL
  Filled 2021-01-30 (×25): qty 1

## 2021-01-30 MED ORDER — PANTOPRAZOLE SODIUM 40 MG PO TBEC
40.0000 mg | DELAYED_RELEASE_TABLET | Freq: Every day | ORAL | Status: DC
  Administered 2021-01-30 – 2021-02-09 (×11): 40 mg via ORAL
  Filled 2021-01-30 (×11): qty 1

## 2021-01-30 NOTE — Plan of Care (Signed)
  Problem: Education: Goal: Knowledge of the prescribed therapeutic regimen will improve 01/30/2021 0746 by Carolynn Serve, RN Outcome: Progressing 01/29/2021 1808 by Carolynn Serve, RN Outcome: Progressing   Problem: Clinical Measurements: Goal: Usual level of consciousness will be regained or maintained. 01/30/2021 0746 by Carolynn Serve, RN Outcome: Progressing 01/29/2021 1808 by Carolynn Serve, RN Outcome: Progressing Goal: Neurologic status will improve 01/30/2021 0746 by Carolynn Serve, RN Outcome: Progressing 01/29/2021 1808 by Carolynn Serve, RN Outcome: Progressing Goal: Ability to maintain intracranial pressure will improve 01/30/2021 0746 by Carolynn Serve, RN Outcome: Progressing 01/29/2021 1808 by Carolynn Serve, RN Outcome: Progressing   Problem: Skin Integrity: Goal: Demonstration of wound healing without infection will improve 01/30/2021 0746 by Carolynn Serve, RN Outcome: Progressing 01/29/2021 1808 by Carolynn Serve, RN Outcome: Progressing   Problem: Education: Goal: Knowledge of General Education information will improve Description: Including pain rating scale, medication(s)/side effects and non-pharmacologic comfort measures 01/30/2021 0746 by Carolynn Serve, RN Outcome: Progressing 01/29/2021 1808 by Carolynn Serve, RN Outcome: Progressing   Problem: Health Behavior/Discharge Planning: Goal: Ability to manage health-related needs will improve 01/30/2021 0746 by Carolynn Serve, RN Outcome: Progressing 01/29/2021 1808 by Carolynn Serve, RN Outcome: Progressing   Problem: Clinical Measurements: Goal: Ability to maintain clinical measurements within normal limits will improve 01/30/2021 0746 by Carolynn Serve, RN Outcome: Progressing 01/29/2021 1808 by Carolynn Serve, RN Outcome: Progressing Goal: Will remain free from infection 01/30/2021 0746 by Carolynn Serve, RN Outcome: Progressing 01/29/2021 1808 by Carolynn Serve, RN Outcome: Progressing Goal: Diagnostic test results  will improve 01/30/2021 0746 by Carolynn Serve, RN Outcome: Progressing 01/29/2021 1808 by Carolynn Serve, RN Outcome: Progressing Goal: Respiratory complications will improve 01/30/2021 0746 by Carolynn Serve, RN Outcome: Progressing 01/29/2021 1808 by Carolynn Serve, RN Outcome: Progressing Goal: Cardiovascular complication will be avoided 01/30/2021 0746 by Carolynn Serve, RN Outcome: Progressing 01/29/2021 1808 by Carolynn Serve, RN Outcome: Progressing   Problem: Activity: Goal: Risk for activity intolerance will decrease 01/30/2021 0746 by Carolynn Serve, RN Outcome: Progressing 01/29/2021 1808 by Carolynn Serve, RN Outcome: Progressing   Problem: Nutrition: Goal: Adequate nutrition will be maintained 01/30/2021 0746 by Carolynn Serve, RN Outcome: Progressing 01/29/2021 1808 by Carolynn Serve, RN Outcome: Progressing   Problem: Coping: Goal: Level of anxiety will decrease 01/30/2021 0746 by Carolynn Serve, RN Outcome: Progressing 01/29/2021 1808 by Carolynn Serve, RN Outcome: Progressing   Problem: Elimination: Goal: Will not experience complications related to bowel motility 01/30/2021 0746 by Carolynn Serve, RN Outcome: Progressing 01/29/2021 1808 by Carolynn Serve, RN Outcome: Progressing Goal: Will not experience complications related to urinary retention 01/30/2021 0746 by Carolynn Serve, RN Outcome: Progressing 01/29/2021 1808 by Carolynn Serve, RN Outcome: Progressing   Problem: Pain Managment: Goal: General experience of comfort will improve 01/30/2021 0746 by Carolynn Serve, RN Outcome: Progressing 01/29/2021 1808 by Carolynn Serve, RN Outcome: Progressing   Problem: Safety: Goal: Ability to remain free from injury will improve 01/30/2021 0746 by Carolynn Serve, RN Outcome: Progressing 01/29/2021 1808 by Carolynn Serve, RN Outcome: Progressing   Problem: Skin Integrity: Goal: Risk for impaired skin integrity will decrease 01/30/2021 0746 by Carolynn Serve, RN Outcome:  Progressing 01/29/2021 1808 by Carolynn Serve, RN Outcome: Progressing

## 2021-01-30 NOTE — Evaluation (Signed)
Physical Therapy Evaluation Patient Details Name: Dana Lang MRN: [id number] DOB: May 29, 1956 Today's Date: 01/30/2021   History of Present Illness  65 yo female presenting 4/6 for craniotomy and excision of L frontal meningioma. The pt has experienced headaches and R-sided weakness since March 2021. PMH includes: hearing loss, OA, peptic ulcer disease, UTIs, and anemia.    Clinical Impression  Pt in bed upon arrival of PT, agreeable to evaluation at this time. Prior to admission the pt was independent with mobility, ADLs, and IADLs, with intermittent use of walking stick or assist from niece. The pt now presents with limitations in functional mobility, stability, strength in RUE and RLE, activity tolerance, and has expressive difficulties due to above dx, and will continue to benefit from skilled PT to address these deficits. The pt was able to demo good initiation of reaching and use of LLE to complete rolling and bed mobility, but continues to require maxA of 2 to complete bed mobility and minG to modA to maintain static sitting balance. The pt was unable to demo any active movement of RLE at this time, but was able to use LLE at times to position or move her RLE. The pt was unable to achieve full stand at this time due to deficits in strength and stability as noted above despite maxA of 2 and blocking of R knee. Pt will continue to benefit from skilled PT acutely and CIR level therapies at d/c to facilitate return to living at home with her dog (pt's stated goal).      Follow Up Recommendations CIR    Equipment Recommendations  Other (comment) (defer to post acute)    Recommendations for Other Services Rehab consult     Precautions / Restrictions Precautions Precautions: Fall Precaution Comments: staples in skull Restrictions Weight Bearing Restrictions: No      Mobility  Bed Mobility Overal bed mobility: Needs Assistance Bed Mobility: Supine to Sit;Sit to Supine      Supine to sit: +2 for physical assistance;+2 for safety/equipment;Max assist Sit to supine: +2 for physical assistance;Total assist;+2 for safety/equipment   General bed mobility comments: pt exiting R side of the bed pt able to reach with L UE with cues and bridge with L LE. pt requires (A) to elevate trunk from bed surface. pt holding foot baord with L UE. pt prefers to hold onto the bed. pt requries max cues to return to supine and total (A) for bil LE    Transfers Overall transfer level: Needs assistance Equipment used: 2 person hand held assist Transfers: Sit to/from Stand Sit to Stand: +2 physical assistance;Max assist         General transfer comment: R LE blocked no activation. pt with R lean. Pt unable to sustain static standing. pt will require hoyer for oOB with RN staff  Ambulation/Gait             General Gait Details: deferred due to pt inability to stand      Balance Overall balance assessment: Needs assistance Sitting-balance support: Single extremity supported;Feet supported Sitting balance-Leahy Scale: Poor     Standing balance support: Single extremity supported;During functional activity Standing balance-Leahy Scale: Zero Standing balance comment: unable to sustain despite maxA of 2                             Pertinent Vitals/Pain Pain Assessment: Faces Faces Pain Scale: Hurts little more Pain Location: head Pain Descriptors /  Indicators: Headache Pain Intervention(s): Limited activity within patient's tolerance;Monitored during session;Premedicated before session    [place] expects to be discharged to:: Private residence Living Arrangements: Alone Available Help at Discharge: Other (Comment) (ex husband) Type of Home: House (duplex) Home Access: Stairs to enter   Entrance Stairs-Number of Steps: 1 Home Layout: One level Home Equipment: Walker - 2 wheels;Shower seat;Grab bars - toilet Additional Comments:  reports ex husband will help and niece if she ask her. Niece is taking FMLA until the end of the month    Prior Function Level of Independence: Independent with assistive device(s)         Comments: pt initially stating use of RW, then states she uses walking stick in the room and that she only has a rollator, not RW anyways. will need to clarify. reports seated showers, states neice assists with IADLs as needed     Hand Dominance   Dominant Hand: Left    Extremity/Trunk Assessment   Upper Extremity Assessment Upper Extremity Assessment: Defer to OT evaluation RUE Deficits / Details: hand activation wrist activation unable to activate for shoulder flexion or scapula retraction. pt able to hold against gravity for elbow flexion RUE Sensation: decreased light touch RUE Coordination: decreased gross motor;decreased fine motor    Lower Extremity Assessment Lower Extremity Assessment: RLE deficits/detail RLE Deficits / Details: no active ROM noted in RLE, pt unable to activate at ankle, knee or hip despite cues and tactile cues. RLE Sensation: decreased light touch    Cervical / Trunk Assessment Cervical / Trunk Assessment: Normal  Communication   Communication: Expressive difficulties  Cognition Arousal/Alertness: Awake/alert Behavior During Therapy: WFL for tasks assessed/performed Overall Cognitive Status: Impaired/Different from baseline Area of Impairment: Safety/judgement;Awareness                         Safety/Judgement: Decreased awareness of safety;Decreased awareness of deficits Awareness: Emergent   General Comments: pt requires questions rephrased and redirected several times during session to get an answer      General Comments General comments (skin integrity, edema, etc.): no dressing on wound, appears dry, risk for injury to skin at R LE dueto inability to move foot. Recommendation to float heel    Exercises     Assessment/Plan    PT  Assessment Patient needs continued PT services  PT Problem List Decreased activity tolerance;Decreased strength;Decreased range of motion;Decreased balance;Decreased mobility;Decreased safety awareness;Impaired sensation       PT Treatment Interventions DME instruction;Gait training;Stair training;Functional mobility training;Therapeutic activities;Therapeutic exercise;Balance training;Neuromuscular re-education;Patient/family education    PT Goals (Current goals can be found in the Care Plan section)  Acute Rehab PT Goals Patient Stated Goal: to get home to dog daisy PT Goal Formulation: With patient Time For Goal Achievement: 02/13/21 Potential to Achieve Goals: Fair    Frequency Min 4X/week   Barriers to discharge        Co-evaluation PT/OT/SLP Co-Evaluation/Treatment: Yes Reason for Co-Treatment: For patient/therapist safety;To address functional/ADL transfers PT goals addressed during session: Mobility/safety with mobility;Balance;Strengthening/ROM OT goals addressed during session: ADL's and self-care;Proper use of Adaptive equipment and DME;Strengthening/ROM       AM-PAC PT "6 Clicks" Mobility  Outcome Measure Help needed turning from your back to your side while in a flat bed without using bedrails?: Total Help needed moving from lying on your back to sitting on the side of a flat bed without using bedrails?: Total Help needed moving to and from a bed  to a chair (including a wheelchair)?: Total Help needed standing up from a chair using your arms (e.g., wheelchair or bedside chair)?: Total Help needed to walk in hospital room?: Total Help needed climbing 3-5 steps with a railing? 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

## 2021-01-30 NOTE — Progress Notes (Signed)
Patient ID: Dana Lang, female   DOB: 1956/04/18, 65 y.o.   MRN: [id number]  BP 137/67 (BP Location: Right Arm)   Pulse (!) 105   Temp 98.3 F (36.8 C) (Oral)   Resp 20   Ht 5\' 1"  (1.549 m)   Wt 93.1 kg   SpO2 94%   BMI 38.78 kg/m   Alert and oriented, speech is not fluent, perseverating  Moving upper extremities, left lower extremity Little purposeful movement in right lower extremity Working with PT, OT  Both believe CIR is appropriate for placement Will have speech see

## 2021-01-30 NOTE — Evaluation (Signed)
Occupational Therapy Evaluation Patient Details Name: Dana Lang MRN: [id number] DOB: [birth year] Today's Date: 01/30/2021    History of Present Illness 65 yo female presenting 4/6 for craniotomy and excision of L frontal meningioma. The pt has experienced headaches and R-sided weakness since March 2021. PMH includes: hearing loss, OA, peptic ulcer disease, UTIs, and anemia.   Clinical Impression   Patient is s/p craniotomy with L frontal meningioma resection surgery resulting in functional limitations due to the deficits listed below (see OT problem list). Pt with R hemiplegia noted and some activation of R UE. Pt with decrease awareness to deficits. Pt will have ex spouse and niece assist at d/c. Pt with expressive deficits. Pt prior to surgery was living alone with dog Daisy.  Patient will benefit from skilled OT acutely to increase independence and safety with ADLS to allow discharge CIR.     Follow Up Recommendations  CIR    Equipment Recommendations  Wheelchair (measurements OT);Wheelchair cushion (measurements OT);3 in 1 bedside commode    Recommendations for Other Services Rehab consult     Precautions / Restrictions Precautions Precautions: Fall Precaution Comments: staples in skull      Mobility Bed Mobility Overal bed mobility: Needs Assistance Bed Mobility: Supine to Sit;Sit to Supine     Supine to sit: +2 for physical assistance;+2 for safety/equipment;Max assist Sit to supine: +2 for physical assistance;Total assist;+2 for safety/equipment   General bed mobility comments: pt exiting R side of the bed pt able to reach with L UE with cues and bridge with L LE. pt requires (A) to elevate trunk from bed surface. pt holding foot baord with L UE. pt prefers to hold onto the bed. pt requries max cues to return to supine and total (A) for bil LE    Transfers Overall transfer level: Needs assistance Equipment used: 2 person hand held assist Transfers: Sit to/from  Stand Sit to Stand: +2 physical assistance;Max assist         General transfer comment: R LE blocked no activation. pt with R lean. Pt unable to sustain static standing. pt will require hoyer for oOB with RN staff    Balance Overall balance assessment: Needs assistance Sitting-balance support: Single extremity supported;Feet supported Sitting balance-Leahy Scale: Poor     Standing balance support: Single extremity supported;During functional activity Standing balance-Leahy Scale: Zero                             ADL either performed or assessed with clinical judgement   ADL Overall ADL's : Needs assistance/impaired     Grooming: Wash/dry face;Minimal assistance;Sitting   Upper Body Bathing: Moderate assistance   Lower Body Bathing: Maximal assistance       Lower Body Dressing: Total assistance   Toilet Transfer: +2 for physical assistance;Maximal assistance Toilet Transfer Details (indicate cue type and reason): sit<>Stand at eob only           General ADL Comments: pt with R LE flaccid recommendation for bed pan or purewick for d/c home     Vision Baseline Vision/History: Wears glasses Wears Glasses: Reading only Vision Assessment?: Yes Eye Alignment: Within Functional Limits Additional Comments: pt with L gaze preference but abl to track R . pt with dizziness with superior vision looking     Perception     Praxis      Pertinent Vitals/Pain Pain Assessment: Faces Faces Pain Scale: Hurts little more Pain Location: head Pain Descriptors /  Indicators: Headache Pain Intervention(s): Monitored during session;Premedicated before session;Repositioned     Hand Dominance Left   Extremity/Trunk Assessment Upper Extremity Assessment Upper Extremity Assessment: RUE deficits/detail RUE Deficits / Details: hand activation wrist activation unable to activate for shoulder flexion or scapula retraction. pt able to hold against gravity for elbow  flexion RUE Sensation: decreased light touch RUE Coordination: decreased gross motor;decreased fine motor       Cervical / Trunk Assessment Cervical / Trunk Assessment: Normal   Communication Communication Communication: Expressive difficulties   Cognition Arousal/Alertness: Awake/alert Behavior During Therapy: WFL for tasks assessed/performed Overall Cognitive Status: Impaired/Different from baseline Area of Impairment: Safety/judgement;Awareness                         Safety/Judgement: Decreased awareness of safety;Decreased awareness of deficits Awareness: Emergent   General Comments: pt requires questions rephrased and redirected several times during session to get an answer   General Comments  no dressing on wound, appears dry, risk for injury to skin at R LE dueto inability to move foot. Recommendation to float heel    Exercises     Shoulder Instructions      Home Living Family/patient expects to be discharged to:: Private residence Living Arrangements: Alone Available Help at Discharge: Other (Comment) (ex husband) Type of Home: House (duplex) Home Access: Stairs to enter Entrance Stairs-Number of Steps: 1   Home Layout: One level     Bathroom Shower/Tub: Teacher, early years/pre: Standard     Home Equipment: Environmental consultant - 2 wheels;Shower seat;Grab bars - toilet   Additional Comments: reports ex husband will help and niece if she ask her. Niece is taking FMLA until the end of the month      Prior Functioning/Environment Level of Independence: Independent with assistive device(s)        Comments: independent with walker, seated showers, states neice can help with IADLs as needed        OT Problem List: Decreased strength;Decreased range of motion;Decreased activity tolerance;Impaired balance (sitting and/or standing);Decreased safety awareness;Decreased knowledge of use of DME or AE;Decreased knowledge of precautions;Pain;Impaired UE  functional use;Impaired sensation      OT Treatment/Interventions: Self-care/ADL training;Therapeutic exercise;Neuromuscular education;Energy conservation;DME and/or AE instruction;Manual therapy;Modalities;Therapeutic activities;Cognitive remediation/compensation;Patient/family education;Balance training    OT Goals(Current goals can be found in the care plan section) Acute Rehab OT Goals Patient Stated Goal: to get home to dog daisy OT Goal Formulation: With patient Time For Goal Achievement: 02/13/21 Potential to Achieve Goals: Good  OT Frequency: Min 2X/week   Barriers to D/C:            Co-evaluation PT/OT/SLP Co-Evaluation/Treatment: Yes Reason for Co-Treatment: For patient/therapist safety;To address functional/ADL transfers;Necessary to address cognition/behavior during functional activity   OT goals addressed during session: ADL's and self-care;Proper use of Adaptive equipment and DME;Strengthening/ROM      AM-PAC OT "6 Clicks" Daily Activity     Outcome Measure Help from another person eating meals?: A Lot Help from another person taking care of personal grooming?: A Lot Help from another person toileting, which includes using toliet, bedpan, or urinal?: A Lot Help from another person bathing (including washing, rinsing, drying)?: A Lot Help from another person to put on and taking off regular upper body clothing?: A Lot Help from another person to put on and taking off regular lower body clothing?: A Lot 6 Click Score: 12   End of Session Equipment Utilized During Treatment: Gait belt Nurse  Communication: Mobility status;Precautions;Need for lift equipment  Activity Tolerance: Patient tolerated treatment well Patient left: in bed;with call bell/phone within reach;with bed alarm set;with SCD's reapplied  OT Visit Diagnosis: Unsteadiness on feet (R26.81);Muscle weakness (generalized) (M62.81);Pain Pain - Right/Left: Right                Time: 1275-1700 OT Time  Calculation (min): 25 min Charges:  OT General Charges $OT Visit: 1 Visit OT Evaluation $OT Eval Moderate Complexity: 1 Mod   Brynn, OTR/L  Acute Rehabilitation Services Pager: [phone] Office: [phone] .   Jeri Modena 01/30/2021, 5:10 PM

## 2021-01-31 NOTE — Progress Notes (Signed)
Subjective: NAEs o/n  Objective: Vital signs in last 24 hours: Temp:  [97.9 F (36.6 C)-98.6 F (37 C)] 98.3 F (36.8 C) (04/09 1111) Pulse Rate:  [83-109] 88 (04/09 1111) Resp:  [18-27] 18 (04/09 1111) BP: (110-143)/(59-76) 127/72 (04/09 1111) SpO2:  [93 %-96 %] 95 % (04/09 1111)  Intake/Output from previous day: 04/08 0701 - 04/09 0700 In: 500 [P.O.:300; IV Piggyback:200] Out: 3100 [Urine:3100] Intake/Output this shift: No intake/output data recorded.  Awake, alert Expressive dysphasia R leg 0/5, RUE 2/5 Incison c/d  Lab Results: Recent Labs    01/28/21 1759 01/29/21 0536  WBC 15.1* 14.1*  HGB 10.1* 8.8*  HCT 31.3* 27.9*  PLT 233 232   BMET Recent Labs    01/28/21 1353 01/28/21 1442 01/28/21 1759  NA 136 140  --   K 4.1 3.9  --   CREATININE  --   --  0.49    Studies/Results: No results found.  Assessment/Plan: S/p resection of meningioma - likely rehab candidate - cont Keppra, dex taper   Vallarie Mare 01/31/2021, 11:30 AM

## 2021-01-31 NOTE — Progress Notes (Addendum)
Physical Therapy Treatment Patient Details Name: Dana Lang MRN: [id number] DOB: [birth year] Today's Date: 01/31/2021    History of Present Illness 65 yo female presenting 4/6 for craniotomy and excision of L frontal meningioma. The pt has experienced headaches and R-sided weakness since March 2021. PMH includes: hearing loss, OA, peptic ulcer disease, UTIs, and anemia.    PT Comments    The pt was seen for progression of functional mobility and initial transfers. The pt continues to have no active movement in RLE at hip, knee, or ankle, but was able to demo some functional use of RUE for grip and AROM during session. The pt continues to require maxA of 1-2, but was able to complete multiple sit-stands from EOB with use of stedy and assist to maintain stability. The pt benefits from blocking of R knee with wt bearing, and requires increased assist to power up as well as facilitation at hips to promote hip extension. Will continue to benefit from skilled PT acutely and post acute to maximize functional recovery and return to independence.    Follow Up Recommendations  CIR     Equipment Recommendations  Other (comment) (defer to post acute)    Recommendations for Other Services       Precautions / Restrictions Precautions Precautions: Fall Precaution Comments: staples in skull Restrictions Weight Bearing Restrictions: No    Mobility  Bed Mobility Overal bed mobility: Needs Assistance Bed Mobility: Supine to Sit;Sit to Supine     Supine to sit: Max assist;+2 for physical assistance;HOB elevated Sit to supine: Max assist;+2 for physical assistance   General bed mobility comments: pt folling cues to reach, needs assist with RLE and positioning of RUE. maxA to BLE and to elevate trunk from [facility]    Transfers Overall transfer level: Needs assistance Equipment used: Ambulation equipment used Transfers: Sit to/from Stand Sit to Stand: Max assist;From elevated surface          General transfer comment: maxA from elevated bed, support to RUE and facilitation and tactile cues at hips. completed from EOB with maxA and from stedy flaps with modA  Ambulation/Gait             General Gait Details: deferred due to pt inability to stand without stedy      Modified Rankin (Stroke Patients Only) Modified Rankin (Stroke Patients Only) Pre-Morbid Rankin Score: Slight disability Modified Rankin: Severe disability     Balance Overall balance assessment: Needs assistance Sitting-balance support: Single extremity supported;Feet supported Sitting balance-Leahy Scale: Poor Sitting balance - Comments: minG with single UE support   Standing balance support: During functional activity;Bilateral upper extremity supported Standing balance-Leahy Scale: Poor Standing balance comment: BUE support with maxA                            Cognition Arousal/Alertness: Awake/alert Behavior During Therapy: WFL for tasks assessed/performed Overall Cognitive Status: Impaired/Different from baseline Area of Impairment: Safety/judgement;Awareness                         Safety/Judgement: Decreased awareness of safety;Decreased awareness of deficits Awareness: Emergent   General Comments: pt with expressive difficulties, able to answer yes/no questions consistently, attempting to follow all commands this session. cues for RUE placement and awareness      Exercises General Exercises - Upper Extremity Elbow Flexion: AAROM;Right;10 reps;Seated Elbow Extension: AAROM;Right;10 reps;Seated Digit Composite Flexion: AROM;10 reps Composite Extension: AROM;10 reps Other  Exercises Other Exercises: static standing with facilitation of wt shift laterally    General Comments General comments (skin integrity, edema, etc.): VSS through session, HR to 125 with standing. Pt noted to be soiled of urine upon arrival of PT.      Pertinent Vitals/Pain Pain  Assessment: Faces Faces Pain Scale: Hurts little more Pain Location: head Pain Descriptors / Indicators: Headache Pain Intervention(s): Limited activity within patient's tolerance;Monitored during session;Repositioned           PT Goals (current goals can now be found in the care plan section) Acute Rehab PT Goals Patient Stated Goal: to get home to dog daisy PT Goal Formulation: With patient Time For Goal Achievement: 02/13/21 Potential to Achieve Goals: Fair Progress towards PT goals: Progressing toward goals    Frequency    Min 4X/week      PT Plan Current plan remains appropriate       AM-PAC PT "6 Clicks" Mobility   Outcome Measure  Help needed turning from your back to your side while in a flat bed without using bedrails?: Total Help needed moving from lying on your back to sitting on the side of a flat bed without using bedrails?: Total Help needed moving to and from a bed to a chair (including a wheelchair)?: Total Help needed standing up from a chair using your arms (e.g., wheelchair or bedside chair)?: Total Help needed to walk in hospital room?: Total Help needed climbing 3-5 steps with a railing? 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

## 2021-01-31 NOTE — Progress Notes (Signed)
Inpatient Rehab Admissions Coordinator:   Met with patient at bedside to discuss potential CIR admission. Pt. Stated interest. Will pursue for potential admit next week, pending bed availability and insurance auth.   Jenna Routzahn, MS, CCC-SLP Rehab Admissions Coordinator  [phone] (celll) [phone] (office)  

## 2021-01-31 NOTE — Progress Notes (Signed)
Inpatient Rehab Admissions Coordinator Note:   Per therapy recommendations, pt was screened for CIR candidacy by Thailan Sava, MS CCC-SLP. At this time, Pt. Appears to have functional decline and is a good candidate for CIR. Will request order for rehab consult per protocol.  Please contact me with questions.   Lisett Dirusso, MS, CCC-SLP Rehab Admissions Coordinator  [phone] (celll) [phone] (office)  

## 2021-02-01 MED ORDER — DEXAMETHASONE 4 MG PO TABS
4.0000 mg | ORAL_TABLET | Freq: Two times a day (BID) | ORAL | Status: DC
  Administered 2021-02-01 – 2021-02-10 (×18): 4 mg via ORAL
  Filled 2021-02-01 (×18): qty 1

## 2021-02-01 NOTE — Consult Note (Addendum)
Physical Medicine and Rehabilitation Consult Reason for Consult: Right side weakness with headache Referring Physician: Dr. Ashok Pall   HPI: Dana Lang is a 65 y.o. right-handed female with history of hearing loss, recurrent UTIs, hypertension, hyperlipidemia, PTSD with panic attacks, schizophrenia, quit smoking 2 years ago as well as history of left parafalcine mass suspect meningioma.  Per chart review lives alone.  Independent with assistive device.  1 level home one-step to entry.  She does have a supportive ex-husband as well as niece.  Presented 01/28/2021 with known history of left parafalcine frontal meningioma followed by Dr. Christella Noa.  Patient experienced increase in headaches and weakness on the right side.  CT showed a large 4.5 x 4.5 roughly centimeter mass with homogenous enhancement.  Due to patient's increasing headache and right-sided weakness underwent craniotomy for left frontal tumor excision 01/28/2021 per Dr. Christella Noa.  Maintained on Decadron protocol.  She was cleared to begin subcutaneous heparin for DVT prophylaxis.  Keppra for seizure prophylaxis.  She is tolerating a regular consistency diet.  Due to patient's right side weakness and decreased functional ability was recommended physical medicine rehab consult. Makes good eye contact but with severe expressive aphasia.    Review of Systems  Constitutional: Negative for chills and fever.  HENT: Negative for hearing loss.   Eyes: Negative for blurred vision and double vision.  Respiratory: Negative for cough and shortness of breath.   Cardiovascular: Negative for chest pain, palpitations and leg swelling.  Gastrointestinal: Positive for constipation. Negative for heartburn, nausea and vomiting.  Genitourinary: Negative for dysuria, flank pain and hematuria.  Musculoskeletal: Positive for joint pain and myalgias.  Skin: Negative for rash.  Neurological: Positive for dizziness, weakness and headaches.   Psychiatric/Behavioral:       Panic attacks with PTSD  All other systems reviewed and are negative.  Past Medical History:  Diagnosis Date  . Anemia   . Arthritis   . High cholesterol   . History of bleeding ulcers 1995   did not require blood transfusion. Dr Henrene Pastor could not find in patient records.  . History of blood transfusion   . Hyperlipidemia   . Hypertension   . Osteopenia   . Osteopenia   . Panic attacks   . PTSD (post-traumatic stress disorder)   . Schizophrenia ([place])    per daughter  . Seasonal allergies    Past Surgical History:  Procedure Laterality Date  . ABDOMINAL HYSTERECTOMY    . APPENDECTOMY    . APPLICATION OF CRANIAL NAVIGATION N/A 01/28/2021   Procedure: APPLICATION OF CRANIAL NAVIGATION;  Surgeon: Ashok Pall, MD;  Location: [place];  Service: Neurosurgery;  Laterality: N/A;  . CRANIOTOMY Left 01/28/2021   Procedure: 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;  Surgeon: Ashok Pall, MD;  Location: [place];  Service: Neurosurgery;  Laterality: Left;  . Dog bite  1970's   cosmetic surgery  . TUBAL LIGATION     Family History  Problem Relation Age of Onset  . Esophageal cancer Mother   . Heart attack Father 17   Social History:  reports that she quit smoking about 2 years ago. Her smoking use included cigarettes. She has a 10.00 pack-year smoking history. She has never used smokeless tobacco. She reports that she does not drink alcohol and does not use drugs. Allergies:  Allergies  Allergen Reactions  . Codeine Nausea And Vomiting   Medications Prior to Admission  Medication Sig Dispense Refill  .  Ascorbic Acid (VITAMIN C PO) Take 1,000 mg by mouth daily.     Marland Kitchen aspirin 81 MG tablet Take 81 mg by mouth daily.    Marland Kitchen b complex vitamins capsule Take 1 capsule by mouth daily.    . Calcium Carb-Cholecalciferol (CALCIUM 1000 + D PO) Take 1,000 mg by mouth daily.    . cetirizine (ZYRTEC) 10 MG tablet Take 10 mg by mouth daily as  needed for allergies.    . Cholecalciferol (VITAMIN D) 50 MCG (2000 UT) tablet Take 2,000 Units by mouth daily.    . ferrous sulfate 325 (65 FE) MG tablet Take 325 mg by mouth every other day.    . hydrochlorothiazide (HYDRODIURIL) 25 MG tablet Take 25 mg by mouth daily.    . Multiple Vitamin (MULTIVITAMIN) capsule Take 1 capsule by mouth daily.    Marland Kitchen PRESCRIPTION MEDICATION Inject into the skin every 30 (thirty) days. Hydrocortisone injection    . rosuvastatin (CRESTOR) 10 MG tablet Take 10 mg by mouth at bedtime.    Marland Kitchen alendronate (FOSAMAX) 70 MG tablet Take 70 mg by mouth once a week.      Home: Home Living Family/patient expects to be discharged to:: Private residence Living Arrangements: Alone Available Help at Discharge: Other (Comment) (ex husband) Type of Home: House (duplex) Home Access: Stairs to enter Entrance Stairs-Number of Steps: 1 Home Layout: One level Bathroom Shower/Tub: Chiropodist: Standard Bathroom Accessibility: No Home Equipment: Walker - 2 wheels,Shower seat,Grab bars - toilet Additional Comments: reports ex husband will help and niece if she ask her. Niece is taking FMLA until the end of the month  Functional History: Prior Function Level of Independence: Independent with assistive device(s) Comments: pt initially stating use of RW, then states she uses walking stick in the room and that she only has a rollator, not RW anyways. will need to clarify. reports seated showers, states neice assists with IADLs as needed Functional Status:  Mobility: Bed Mobility Overal bed mobility: Needs Assistance Bed Mobility: Supine to Sit,Sit to Supine Supine to sit: Max assist,+2 for physical assistance,HOB elevated Sit to supine: Max assist,+2 for physical assistance General bed mobility comments: pt folling cues to reach, needs assist with RLE and positioning of RUE. maxA to BLE and to elevate trunk from [facility] Transfers Overall transfer level: Needs  assistance Equipment used: Ambulation equipment used Transfer via Lift Equipment: Stedy Transfers: Sit to/from Stand Sit to Stand: Max assist,From elevated surface General transfer comment: maxA from elevated bed, support to RUE and facilitation and tactile cues at hips. completed from EOB with maxA and from stedy flaps with modA Ambulation/Gait General Gait Details: deferred due to pt inability to stand without stedy    ADL: ADL Overall ADL's : Needs assistance/impaired Grooming: Wash/dry face,Minimal assistance,Sitting Upper Body Bathing: Moderate assistance Lower Body Bathing: Maximal assistance Lower Body Dressing: Total assistance Toilet Transfer: +2 for physical assistance,Maximal assistance Toilet Transfer Details (indicate cue type and reason): sit<>Stand at eob only General ADL Comments: pt with R LE flaccid recommendation for bed pan or purewick for d/c home  Cognition: Cognition Overall Cognitive Status: Impaired/Different from baseline Orientation Level: Oriented X4 Cognition Arousal/Alertness: Awake/alert Behavior During Therapy: WFL for tasks assessed/performed Overall Cognitive Status: Impaired/Different from baseline Area of Impairment: Safety/judgement,Awareness Safety/Judgement: Decreased awareness of safety,Decreased awareness of deficits Awareness: Emergent General Comments: pt with expressive difficulties, able to answer yes/no questions consistently, attempting to follow all commands this session. cues for RUE placement and awareness  Blood pressure 114/75,  pulse 89, temperature 97.6 F (36.4 C), temperature source Axillary, resp. rate 19, height 5\' 1"  (1.549 m), weight 93.1 kg, SpO2 92 %. Physical Exam Gen: no distress, normal appearing HEENT: oral mucosa pink and moist, Craniotomy site clean and dry. Cardio: Reg rate Chest: normal effort, normal rate of breathing Abd: soft, non-distended Ext: no edema Psych: flat, makes eye contact Skin:  intact Neuro:Patient is alert in no acute distress.  Makes eye contact with examiner.  Provides her name and age with some perseveration.  Follows simple commands. 0/5 RLE. 2/5 RUE. Left sided strength intact. Expressive >receptive aphasia  No results found for this or any previous visit (from the past 24 hour(s)). No results found.   Assessment/Plan: Diagnosis: s/p craniotomy for meningioma resection 1. Does the need for close, 24 hr/day medical supervision in concert with the patient's rehab needs make it unreasonable for this patient to be served in a less intensive setting? Yes 2. Co-Morbidities requiring supervision/potential complications:  1. Skin pressure injury: offload q2H 2. Chest pain 3. Right arm pain (chronic) 4. Schizophrenia 5. Tobacco abuse: provide counseling 6. Expressive>receptive aphasia: intensive SLP 3. Due to bladder management, bowel management, safety, skin/wound care, disease management, medication administration, pain management and patient education, does the patient require 24 hr/day rehab nursing? Yes 4. Does the patient require coordinated care of a physician, rehab nurse, therapy disciplines of PT, OT, SLP to address physical and functional deficits in the context of the above medical diagnosis(es)? Yes Addressing deficits in the following areas: balance, endurance, locomotion, strength, transferring, bowel/bladder control, bathing, dressing, feeding, grooming, toileting, cognition, speech, language and psychosocial support 5. Can the patient actively participate in an intensive therapy program of at least 3 hrs of therapy per day at least 5 days per week? Yes 6. The potential for patient to make measurable gains while on inpatient rehab is good 7. Anticipated functional outcomes upon discharge from inpatient rehab are min assist  with PT, min assist with OT, min assist with SLP. 8. Estimated rehab length of stay to reach the above functional goals is: 2-3  weeks 9. Anticipated discharge destination: Home 10. Overall Rehab/Functional Prognosis: good  RECOMMENDATIONS: This patient's condition is appropriate for continued rehabilitative care in the following setting: CIR Patient has agreed to participate in recommended program. Yes Note that insurance prior authorization may be required for reimbursement for recommended care.  Comment: Thank you for this consult. Admission coordinator to follow.   I have personally performed a face to face diagnostic evaluation, including, but not limited to relevant history and physical exam findings, of this patient and developed relevant assessment and plan.  Additionally, I have reviewed and concur with the physician assistant's documentation above.  Leeroy Cha, MD  Lavon Paganini Monroe, PA-C 02/02/2021

## 2021-02-01 NOTE — Progress Notes (Signed)
Inpatient Rehab Admissions Coordinator:   I spoke with Pt.'s daughter on the phone regarding CIR admission. She is interested in CIR for patient but needs to talk to other family members to see if 24/7 support is an option.She runs an in home daycare and can provide only min A to supervision herself.  She also stated that Pt. Is a service connected veteran with VA benefits and asked if Pt..could potentially use benefits for SNF after CIR I will look into this.   Clemens Catholic, [place], [place] Admissions Coordinator  [phone] ([place]) [phone] (office)

## 2021-02-01 NOTE — Progress Notes (Signed)
Subjective: NAEs o/n  Objective: Vital signs in last 24 hours: Temp:  [97.7 F (36.5 C)-98.8 F (37.1 C)] 97.7 F (36.5 C) (04/10 0750) Pulse Rate:  [65-101] 65 (04/10 1100) Resp:  [19-20] 19 (04/10 1100) BP: (107-138)/(58-79) 113/58 (04/10 1100) SpO2:  [93 %-99 %] 94 % (04/10 1100)  Intake/Output from previous day: 04/09 0701 - 04/10 0700 In: 600 [P.O.:600] Out: 2750 [Urine:2750] Intake/Output this shift: Total I/O In: 240 [P.O.:240] Out: -   Expressive aphasia C/d R leg plegic RUE 2/5  Lab Results: No results for input(s): WBC, HGB, HCT, PLT in the last 72 hours. BMET No results for input(s): NA, K, CL, CO2, GLUCOSE, BUN, CREATININE, CALCIUM in the last 72 hours.  Studies/Results: No results found.  Assessment/Plan: S/p craniotomy for menignioma - likely good rehab candidate, possible admit early this week    Dana Lang 02/01/2021, 11:19 AM

## 2021-02-02 DIAGNOSIS — Z9889 Other specified postprocedural states: Secondary | ICD-10-CM

## 2021-02-02 DIAGNOSIS — D329 Benign neoplasm of meninges, unspecified: Secondary | ICD-10-CM

## 2021-02-02 NOTE — Progress Notes (Signed)
Physical Therapy Treatment Patient Details Name: Dana Lang MRN: [id number] DOB: April 10, 1956 Today's Date: 02/02/2021    History of Present Illness 65 yo female presenting 4/6 for craniotomy and excision of L frontal meningioma. The pt has experienced headaches and R-sided weakness since March 2021. PMH includes: hearing loss, OA, peptic ulcer disease, UTIs, and anemia.    PT Comments    The pt was seen for continued progression of functional strength and OOB transfers. The pt was able to demo great improvement in functional movement of RLE today as she was able to demo movement at toes/ankle as well as at R hip. The pt continues to require assist to manage bed mobility as well as maxA of 2 with use of stedy to power up from EOB. The pt is able to use LUE and LLE well to power up after maxA to initiate hip lift and modA to steady and maintain midline in standing. The pt was able to complete x2 sit-stand from low surface, and x5 from stedy flaps at this time. Session ended with pt reporting increased headache and BP with slight elevation and map of 113. Pt will continue to benefit from targeted strengthening of R-sided extremities and increased activity tolerance to allow for improved tolerance and independence with OOB mobility. Continue to recommend CIR level therapies.    Follow Up Recommendations  CIR     Equipment Recommendations  Other (comment) (defer to post acute)    Recommendations for Other Services       Precautions / Restrictions Precautions Precautions: Fall Precaution Comments: staples in skull Restrictions Weight Bearing Restrictions: No    Mobility  Bed Mobility Overal bed mobility: Needs Assistance Bed Mobility: Supine to Sit     Supine to sit: Max assist;HOB elevated     General bed mobility comments: maxA of 1 to move RLE to EOB and then pt able to move LLE to EOB and pull up on PT to pull to sit EOB. modA to steady initially, then minG with moments of  minA for posterior LOB    Transfers Overall transfer level: Needs assistance Equipment used: Ambulation equipment used Transfers: Sit to/from [facility] Sit to Stand: Max assist;+2 physical assistance;From elevated surface Stand pivot transfers: Total assist (in stedy)       General transfer comment: maxA from elevated bed, pulling with LUE, slow rise with increased cues, tactile cues at hips for hip ext, cues to attend to RUE for grip  Ambulation/Gait             General Gait Details: deferred due to pt inability to stand without stedy       Modified Rankin (Stroke Patients Only) Modified Rankin (Stroke Patients Only) Pre-Morbid Rankin Score: Slight disability Modified Rankin: Severe disability     Balance Overall balance assessment: Needs assistance Sitting-balance support: Single extremity supported;Feet supported Sitting balance-Leahy Scale: Poor Sitting balance - Comments: minG with single UE support Postural control: Posterior lean Standing balance support: During functional activity;Bilateral upper extremity supported Standing balance-Leahy Scale: Poor Standing balance comment: BUE support with maxA                            Cognition Arousal/Alertness: Awake/alert Behavior During Therapy: WFL for tasks assessed/performed Overall Cognitive Status: Impaired/Different from baseline Area of Impairment: Safety/judgement;Awareness                         Safety/Judgement:  Decreased awareness of safety;Decreased awareness of deficits Awareness: Emergent   General Comments: pt with expressive difficulties, able to answer yes/no consistently, follows commands consistently, but requires cues for RUE and RLE placement, awareness      Exercises      General Comments General comments (skin integrity, edema, etc.): HR to 122, BP of 157/97 (113) with standing activity when pt c/o headache, returned to 142/84 (97) with return  to sitting      Pertinent Vitals/Pain Pain Assessment: Faces Faces Pain Scale: Hurts even more Pain Location: head Pain Descriptors / Indicators: Headache Pain Intervention(s): Limited activity within patient's tolerance;Monitored during session;Repositioned           PT Goals (current goals can now be found in the care plan section) Acute Rehab PT Goals Patient Stated Goal: to get home to dog daisy PT Goal Formulation: With patient Time For Goal Achievement: 02/13/21 Potential to Achieve Goals: Fair Progress towards PT goals: Progressing toward goals    Frequency    Min 4X/week      PT Plan Current plan remains appropriate       AM-PAC PT "6 Clicks" Mobility   Outcome Measure  Help needed turning from your back to your side while in a flat bed without using bedrails?: Total Help needed moving from lying on your back to sitting on the side of a flat bed without using bedrails?: Total Help needed moving to and from a bed to a chair (including a wheelchair)?: Total Help needed standing up from a chair using your arms (e.g., wheelchair or bedside chair)?: Total Help needed to walk in hospital room?: Total Help needed climbing 3-5 steps with a railing? : Total 6 Click Score: 6    End of Session Equipment Utilized During Treatment: Gait belt Activity Tolerance: Patient tolerated treatment well Patient left: in chair;with chair alarm set;with family/visitor present Nurse Communication: Mobility status;Need for lift equipment PT Visit Diagnosis: Other abnormalities of gait and mobility (R26.89);Muscle weakness (generalized) (M62.81);Hemiplegia and hemiparesis Hemiplegia - Right/Left: Right Hemiplegia - dominant/non-dominant: Non-dominant Hemiplegia - caused by:  (brain tumor s/p crani)     Time: 5686-1683 PT Time Calculation (min) (ACUTE ONLY): 28 min  Charges:  $Therapeutic Exercise: 8-22 mins $Therapeutic Activity: 8-22 mins                     Karma Ganja, PT,  DPT   Acute Rehabilitation Department Pager #: [phone]   Otho Bellows 02/02/2021, 11:47 AM

## 2021-02-02 NOTE — Progress Notes (Signed)
Inpatient Rehab Admissions Coordinator:   I spoke with Pt.'s daughter over the phone and she stated that she remains interested in CIR for patient but is not sure she can provide 24/7 support. She states that Pt. Has VA insurance (not listed in chart) and wants to know if the [place] will cover SNF after a CIR stay if Pt.'s [facility] Medicare policy pays for CIR. I will contact the pre-service center to see if we can identify a [place] and discuss this with rehab MD if a policy is found.   Clemens Catholic, [place], [place] Admissions Coordinator  [phone] ([place]) [phone] (office)

## 2021-02-02 NOTE — Progress Notes (Signed)
Patient ID: Dana Lang, female   DOB: Dec 26, 1955, 65 y.o.   MRN: [id number] BP 124/74 (BP Location: Right Arm)   Pulse 82   Temp 98 F (36.7 C) (Oral)   Resp 17   Ht 5\' 1"  (1.549 m)   Wt 93.1 kg   SpO2 96%   BMI 38.78 kg/m  Alert, and oriented x 4, speech is clear and fluent Moving all extremities, very weak in right lower extremity Wound is healing well, no signs of infection Agree she would be a good candidate for rehabilitation. Daughter has informed me that she does carry a diagnosis of Schizophrenia.

## 2021-02-03 NOTE — Progress Notes (Signed)
Occupational Therapy Treatment Patient Details Name: Dana Lang MRN: [id number] DOB: 1956/10/21 Today's Date: 02/03/2021    History of present illness 65 yo female presenting 4/6 for craniotomy and excision of L frontal meningioma. The pt has experienced headaches and R-sided weakness since March 2021. PMH includes: hearing loss, OA, peptic ulcer disease, UTIs, and anemia.   OT comments  Pt progressing towards established OT goals. Pt performing sit<>stand for simulated toilet transfer with Max A +2 and bilateral knees blocked. Pt performing forward mobility with Max A +2 demonstrating increased progress in activity tolerance. Pt very motivated to participate in therapy despite fatigue and fear of falling. Facilitating AAROM of BUE while seated. Pt continue to present with R inattention, poor functional use of RUE, aphasia, decreased cognition, and poor balance and strength. Continue to recommend dc to CIR for intensive OT and will continue to follow acutely as admitted.    Follow Up Recommendations  CIR    Equipment Recommendations  Wheelchair (measurements OT);Wheelchair cushion (measurements OT);3 in 1 bedside commode    Recommendations for Other Services Rehab consult    Precautions / Restrictions Precautions Precautions: Fall Precaution Comments: staples in skull Restrictions Weight Bearing Restrictions: No       Mobility Bed Mobility Overal bed mobility: Needs Assistance             General bed mobility comments: up in chair upon PT/OT arrival to room    Transfers Overall transfer level: Needs assistance Equipment used: 2 person hand held assist Transfers: Sit to/from Stand Sit to Stand: Max assist;+2 physical assistance        General transfer comment: max +2 for power up, rise, posterior truncal extension via tactile facilitation, RLE blocking in knee extension, and sternal facilitation for chest opening/upright posture. STS x3 from chair, with weight  shifting L and R with R knee blocked.    Balance Overall balance assessment: Needs assistance Sitting-balance support: Single extremity supported;Feet supported Sitting balance-Leahy Scale: Poor Sitting balance - Comments: minG with single UE support Postural control: Posterior lean Standing balance support: During functional activity;Bilateral upper extremity supported Standing balance-Leahy Scale: Poor Standing balance comment: BUE support with maxA +2                           ADL either performed or assessed with clinical judgement   ADL Overall ADL's : Needs assistance/impaired                         Toilet Transfer: Maximal assistance;+2 for physical assistance;Ambulation (simulated at recliner) Toilet Transfer Details (indicate cue type and reason): Max A +2 for sit<>stand and to take steps forward. Requiring assistance for balance, coorindation, and blocking BLEs.         Functional mobility during ADLs: Maximal assistance;+2 for physical assistance;+2 for safety/equipment General ADL Comments: Pt continues to present with decreased balance, strength, coordination, attention to R, and cognition. Despite weakness, pt very motivated to participate intherapy     Vision   Vision Assessment?: Yes Eye Alignment: Within Functional Limits   Perception Perception Perception Tested?: Yes Perception Deficits: Inattention/neglect Inattention/Neglect: Does not attend to right side of body;Does not attend to right visual field   Praxis      Cognition Arousal/Alertness: Awake/alert Behavior During Therapy: WFL for tasks assessed/performed Overall Cognitive Status: Impaired/Different from baseline Area of Impairment: Safety/judgement;Awareness;Attention  Current Attention Level: Sustained     Safety/Judgement: Decreased awareness of safety;Decreased awareness of deficits Awareness: Emergent;Intellectual   General Comments:  Expressive difficulties noted, pt responds "yep" to several questions, requires cues to try again, word finding and effortful speech difficulty throughout session. Pt inattentive to R side, requires multimodal cuing to attend to R and benefits from cue "look at your R arm/leg". Pt consistently following commands        Exercises Exercises: General Upper Extremity General Exercises - Upper Extremity Shoulder Flexion: AAROM;Right;5 reps;Seated Elbow Flexion: AAROM;Right;10 reps;Seated Elbow Extension: AAROM;Right;10 reps;Seated Digit Composite Flexion: AROM;10 reps Composite Extension: AROM;10 reps General Exercises - Lower Extremity Long Arc Quad: PROM;Right;15 reps;Seated Hip Flexion/Marching: PROM;Right;10 reps;Seated   Shoulder Instructions       General Comments VSS    Pertinent Vitals/ Pain       Pain Assessment: Faces Faces Pain Scale: Hurts little more Pain Location: R knee, with terminal knee extension Pain Descriptors / Indicators: Sore;Discomfort;Grimacing Pain Intervention(s): Monitored during session;Limited activity within patient's tolerance;Repositioned  Home Living                                          Prior Functioning/Environment              Frequency  Min 2X/week        Progress Toward Goals  OT Goals(current goals can now be found in the care plan section)  Progress towards OT goals: Progressing toward goals  Acute Rehab OT Goals Patient Stated Goal: to get home to dog daisy OT Goal Formulation: With patient Time For Goal Achievement: 02/13/21 Potential to Achieve Goals: Good ADL Goals Pt Will Perform Grooming: sitting;with min guard assist Pt Will Perform Upper Body Bathing: with min guard assist;sitting Pt Will Transfer to Toilet: with +2 assist;with max assist;stand pivot transfer Additional ADL Goal #1: pt will complete bed mobility max (A) as precursor to adls. Additional ADL Goal #2: pt will complete basic  transfer total +2 max (A) using sliding board  Plan Discharge plan remains appropriate    Co-evaluation    PT/OT/SLP Co-Evaluation/Treatment: Yes Reason for Co-Treatment: For patient/therapist safety;To address functional/ADL transfers;Complexity of the patient's impairments (multi-system involvement) PT goals addressed during session: Mobility/safety with mobility;Balance OT goals addressed during session: ADL's and self-care      AM-PAC OT "6 Clicks" Daily Activity     Outcome Measure   Help from another person eating meals?: A Lot Help from another person taking care of personal grooming?: A Lot Help from another person toileting, which includes using toliet, bedpan, or urinal?: A Lot Help from another person bathing (including washing, rinsing, drying)?: A Lot Help from another person to put on and taking off regular upper body clothing?: A Lot Help from another person to put on and taking off regular lower body clothing?: A Lot 6 Click Score: 12    End of Session Equipment Utilized During Treatment: Gait belt  OT Visit Diagnosis: Unsteadiness on feet (R26.81);Muscle weakness (generalized) (M62.81);Pain Pain - Right/Left: Right   Activity Tolerance Patient tolerated treatment well   Patient Left with call bell/phone within reach;in chair;with chair alarm set   Nurse Communication Mobility status;Precautions;Need for lift equipment        Time: 1655-3748 OT Time Calculation (min): 25 min  Charges: OT General Charges $OT Visit: 1 Visit OT Treatments $Therapeutic Activity: 8-22 mins  Axl Rodino  Aviv Lengacher MSOT, OTR/L Acute Rehab Pager: [phone] Office: [place] 02/03/2021, 12:22 PM

## 2021-02-03 NOTE — PMR Pre-admission (Shared)
PMR Admission Coordinator Pre-Admission Assessment  Patient: Dana Lang is an 65 y.o., female MRN: [id number] DOB: 1956-02-25 Height: 5\' 1"  (154.9 cm) Weight: 93.1 kg              Insurance Information HMO: yes    PPO:      PCP:      IPA:      80/20:      OTHER:  PRIMARY: [employer] Medicare       Policy#: ***      Subscriber: *** CM Name: ***      Phone#: ***     Fax#: *** Pre-Cert#: ***      Employer: *** Benefits:  Phone #: ***     Name: *** Eff. Date: ***     Deduct: ***      Out of Pocket Max: ***      Life Max: ***  CIR: ***      SNF: *** Outpatient: ***     Co-Pay: *** Home Health: ***      Co-Pay: *** DME: ***     Co-Pay: *** Providers: *** SECONDARY: ***      Policy#: ***      Phone#: ***  Financial Counselor: ***      Phone#: ***  The "Data Collection Information Summary" for patients in Inpatient Rehabilitation Facilities with attached "Privacy Act [place] Records" was provided and verbally reviewed with: Patient  Emergency Contact Information Contact Information    Name Relation Home Work Mobile   DiMarco,Jennifer Daughter [phone]     Jon Billings   [phone]     Current Medical History  Patient Admitting Diagnosis: Meningioma s/p craniotomy History of Present Illness: SALMA WALROND is a 65 y.o. right-handed female with history of hearing loss, recurrent UTIs, hypertension, hyperlipidemia, PTSD with panic attacks, schizophrenia, quit smoking 2 years ago as well as history of left parafalcine mass suspect meningioma.  Per chart review lives alone.  Independent with assistive device.  1 level home one-step to entry.  She does have a supportive ex-husband as well as niece.  Presented 01/28/2021 with known history of left parafalcine frontal meningioma followed by Dr. Christella Noa.  Patient experienced increase in headaches and weakness on the right side.  CT showed a large 4.5 x 4.5 roughly centimeter mass with homogenous enhancement.  Due to  patient's increasing headache and right-sided weakness underwent craniotomy for left frontal tumor excision 01/28/2021 per Dr. Christella Noa.  Maintained on Decadron protocol.  She was cleared to begin subcutaneous heparin for DVT prophylaxis.  Keppra for seizure prophylaxis.  She is tolerating a regular consistency diet.  Due to patient's right side weakness and decreased functional ability was recommended physical medicine rehab consult. Makes good eye contact but with severe expressive aphasia.    Glasgow Coma Scale Score: 15  Past Medical History  Past Medical History:  Diagnosis Date  . Anemia   . Arthritis   . High cholesterol   . History of bleeding ulcers 1995   did not require blood transfusion. Dr Henrene Pastor could not find in patient records.  . History of blood transfusion   . Hyperlipidemia   . Hypertension   . Osteopenia   . Osteopenia   . Panic attacks   . PTSD (post-traumatic stress disorder)   . Schizophrenia ([place]ri[place])    per daughter  . Seasonal allergies     Family History  family history includes Esophageal cancer in her mother; Heart attack (age of onset: 45) in  her father.  Prior Rehab/Hospitalizations:  Has the patient had prior rehab or hospitalizations prior to admission? No  Has the patient had major surgery during 100 days prior to admission? Yes  Current Medications   Current Facility-Administered Medications:  .  acetaminophen (TYLENOL) tablet 650 mg, 650 mg, Oral, Q4H PRN, 650 mg at 02/03/21 0913 **OR** acetaminophen (TYLENOL) suppository 650 mg, 650 mg, Rectal, Q4H PRN, Ashok Pall, MD .  ascorbic acid (VITAMIN C) tablet 1,000 mg, 1,000 mg, Oral, Daily, Ashok Pall, MD, 1,000 mg at 02/03/21 0913 .  aspirin EC tablet 81 mg, 81 mg, Oral, Daily, Ashok Pall, MD, 81 mg at 02/03/21 0913 .  bisacodyl (DULCOLAX) EC tablet 5 mg, 5 mg, Oral, Daily PRN, Ashok Pall, MD .  cholecalciferol (VITAMIN D3) tablet 2,000 Units, 2,000 Units, Oral, Daily, Ashok Pall, MD,  2,000 Units at 02/03/21 0913 .  [COMPLETED] dexamethasone (DECADRON) tablet 6 mg, 6 mg, Oral, Q6H, 6 mg at 01/29/21 1216 **FOLLOWED BY** [COMPLETED] dexamethasone (DECADRON) tablet 4 mg, 4 mg, Oral, Q6H, 4 mg at 01/30/21 1218 **FOLLOWED BY** dexamethasone (DECADRON) tablet 4 mg, 4 mg, Oral, Q12H, Vallarie Mare, MD, 4 mg at 02/03/21 0913 .  ferrous sulfate tablet 325 mg, 325 mg, Oral, QODAY, Ashok Pall, MD, 325 mg at 02/02/21 1100 .  heparin injection 5,000 Units, 5,000 Units, Subcutaneous, Q8H, Ashok Pall, MD, 5,000 Units at 02/03/21 [phone] .  hydrochlorothiazide (HYDRODIURIL) tablet 25 mg, 25 mg, Oral, Daily, Ashok Pall, MD, 25 mg at 02/03/21 0913 .  HYDROcodone-acetaminophen (NORCO/VICODIN) 5-325 MG per tablet 1 tablet, 1 tablet, Oral, Q4H PRN, Ashok Pall, MD, 1 tablet at 01/28/21 2000 .  labetalol (NORMODYNE) injection 10-40 mg, 10-40 mg, Intravenous, Q10 min PRN, Ashok Pall, MD, 10 mg at 01/28/21 1724 .  levETIRAcetam (KEPPRA) tablet 500 mg, 500 mg, Oral, Q12H, Ashok Pall, MD, 500 mg at 02/03/21 0658 .  loratadine (CLARITIN) tablet 10 mg, 10 mg, Oral, Daily, Ashok Pall, MD, 10 mg at 02/03/21 0913 .  magnesium citrate solution 1 Bottle, 1 Bottle, Oral, Once PRN, Ashok Pall, MD .  MEDLINE mouth rinse, 15 mL, Mouth Rinse, BID, Ashok Pall, MD, 15 mL at 02/03/21 0914 .  morphine 2 MG/ML injection 1-2 mg, 1-2 mg, Intravenous, Q2H PRN, Ashok Pall, MD .  naloxone [facility]) injection 0.08 mg, 0.08 mg, Intravenous, PRN, Ashok Pall, MD .  ondansetron (ZOFRAN) tablet 4 mg, 4 mg, Oral, Q4H PRN **OR** ondansetron (ZOFRAN) injection 4 mg, 4 mg, Intravenous, Q4H PRN, Ashok Pall, MD, 4 mg at 01/29/21 1049 .  pantoprazole (PROTONIX) EC tablet 40 mg, 40 mg, Oral, QHS, Ashok Pall, MD, 40 mg at 02/02/21 2115 .  promethazine (PHENERGAN) tablet 12.5-25 mg, 12.5-25 mg, Oral, Q4H PRN, Ashok Pall, MD .  rosuvastatin (CRESTOR) tablet 10 mg, 10 mg, Oral, QHS, Ashok Pall, MD, 10  mg at 02/02/21 2115 .  senna (SENOKOT) tablet 8.6 mg, 1 tablet, Oral, BID, Ashok Pall, MD, 8.6 mg at 02/03/21 0913 .  senna-docusate (Senokot-S) tablet 1 tablet, 1 tablet, Oral, QHS PRN, Ashok Pall, MD  Patients Current Diet:  Diet Order            Diet regular Room service appropriate? Yes; Fluid consistency: Thin  Diet effective now                 Precautions / Restrictions Precautions Precautions: Fall Precaution Comments: staples in skull Restrictions Weight Bearing Restrictions: No   Has the patient had 2 or more falls or a fall  with injury in the past year?No  Prior Activity Level Community (5-7x/wk): Pt. active in the community PTA  Prior Functional Level Prior Function Level of Independence: Independent with assistive device(s) Comments: pt initially stating use of RW, then states she uses walking stick in the room and that she only has a rollator, not RW anyways. will need to clarify. reports seated showers, states neice assists with IADLs as needed  Self Care: Did the patient need help bathing, dressing, using the toilet or eating?  Independent  Indoor Mobility: Did the patient need assistance with walking from room to room (with or without device)? Independent  Stairs: Did the patient need assistance with internal or external stairs (with or without device)? Independent  Functional Cognition: Did the patient need help planning regular tasks such as shopping or remembering to take medications? [place] Devices/Equipment: Cane (specify quad or straight),Dentures (specify type),Blood pressure cuff,Eyeglasses Home Equipment: Walker - 2 wheels,Shower seat,Grab bars - toilet  Prior Device Use: Indicate devices/aids used by the patient prior to current illness, exacerbation or injury? None of the above  Current Functional Level Cognition  Overall Cognitive Status: Impaired/Different from baseline Current  Attention Level: Sustained Orientation Level: Oriented X4 Safety/Judgement: Decreased awareness of safety,Decreased awareness of deficits General Comments: Expressive difficulties noted, pt responds "yep" to several questions, requires cues to try again, word finding and effortful speech difficulty throughout session. Pt inattentive to R side, requires multimodal cuing to attend to R and benefits from cue "look at your R arm/leg". Pt consistently following commands    Extremity Assessment (includes Sensation/Coordination)  Upper Extremity Assessment: RUE deficits/detail RUE Deficits / Details: Able to bring hand to mouth and then head. Difficulty with elbow extension and shoulder forward flexion. Weak. RUE Sensation: decreased light touch RUE Coordination: decreased gross motor,decreased fine motor  Lower Extremity Assessment: Defer to PT evaluation RLE Deficits / Details: no active ROM noted in RLE, pt unable to activate at ankle, knee or hip despite cues and tactile cues. RLE Sensation: decreased light touch    ADLs  Overall ADL's : Needs assistance/impaired Grooming: Wash/dry face,Minimal assistance,Sitting Upper Body Bathing: Moderate assistance Lower Body Bathing: Maximal assistance Lower Body Dressing: Total assistance Toilet Transfer: Maximal assistance,+2 for physical assistance,Ambulation (simulated at recliner) Toilet Transfer Details (indicate cue type and reason): Max A +2 for sit<>stand and to take steps forward. Requiring assistance for balance, coorindation, and blocking BLEs. Functional mobility during ADLs: Maximal assistance,+2 for physical assistance,+2 for safety/equipment General ADL Comments: Pt continues to present with decreased balance, strength, coordination, attention to R, and cognition. Despite weakness, pt very motivated to participate intherapy    Mobility  Overal bed mobility: Needs Assistance Bed Mobility: Supine to Sit Supine to sit: Max assist,HOB  elevated Sit to supine: Max assist,+2 for physical assistance General bed mobility comments: up in chair upon PT/OT arrival to room    Transfers  Overall transfer level: Needs assistance Equipment used: 2 person hand held assist Transfer via Lift Equipment: Stedy Transfers: Sit to/from [employer] to Stand: Max assist,+2 physical assistance Stand pivot transfers:  (in stedy) General transfer comment: max +2 for power up, rise, posterior truncal extension via tactile facilitation, RLE blocking in knee extension, and sternal facilitation for chest opening/upright posture. STS x3 from chair, with weight shifting L and R with R knee blocked.    Ambulation / Gait / Stairs / Wheelchair Mobility  Ambulation/Gait Ambulation/Gait assistance: Max assist,+2 physical assistance,+2 safety/equipment (PT, OT,  and chair follow) Gait Distance (Feet): 3 Feet Assistive device: 2 person hand held assist Gait Pattern/deviations: Step-to pattern,Decreased step length - right,Decreased weight shift to right,Trunk flexed,Decreased dorsiflexion - right General Gait Details: max +2 for supporting trunk, weight shifting laterally, RLE swing phase and stance phase tactile facilitation with stance phase requiring R knee blocking. PT sitting in chair to pt's R to manage RLE, OT supporting LUE over OT's shoulders and managing truncal support and weight shifting. Gait velocity: decr    Posture / Balance Dynamic Sitting Balance Sitting balance - Comments: minG with single UE support Balance Overall balance assessment: Needs assistance Sitting-balance support: Single extremity supported,Feet supported Sitting balance-Leahy Scale: Poor Sitting balance - Comments: minG with single UE support Postural control: Posterior lean Standing balance support: During functional activity,Bilateral upper extremity supported Standing balance-Leahy Scale: Poor Standing balance comment: BUE support with maxA +2    Special needs/care  consideration Skin ***     Previous Home Environment (from acute therapy documentation) Living Arrangements: Alone Available Help at Discharge: Other (Comment) (ex husband) Type of Home: House (duplex) Home Layout: One level Home Access: Stairs to enter [employer] of Steps: 1 Bathroom Shower/Tub: Chiropodist: Standard Bathroom Accessibility: No Home Care Services: No Additional Comments: reports ex husband will help and niece if she ask her. Niece is taking FMLA until the end of the month  Discharge Living Setting Plans for Discharge Living Setting: Patient's home Type of Home at Discharge: House Discharge Home Layout: One level Discharge Home Access: Stairs to enter Entrance Stairs-Rails: Right Entrance Stairs-Number of Steps: 1 Discharge Bathroom Shower/Tub: Tub/shower unit Discharge Bathroom Toilet: Standard Discharge Bathroom Accessibility: Yes How Accessible: Accessible via walker Does the patient have any problems obtaining your medications?: No  Social/Family/Support Systems Patient Roles: Other (Comment) Contact Information: [phone] Anticipated Caregiver: Deatra Robinson (daughter) Anticipated Caregiver's Contact Information: [phone]   Goals Patient/Family Goal for Rehab: PT/OT Mod A, SLP Min A Expected length of stay: 21-24 days Pt/Family Agrees to Admission and willing to participate: Yes Program Orientation Provided & Reviewed with Pt/Caregiver Including Roles  & Responsibilities: Yes   Decrease burden of Care through IP rehab admission: Specialzed equipment needs, Decrease number of caregivers, Bowel and bladder program and Patient/family education   Possible need for SNF placement upon discharge: not anticipated.   Patient Condition: {PATIENT'S CONDITION:22832}  Preadmission Screen Completed By:  Genella Mech, CCC-SLP, 02/03/2021 1:00 PM ______________________________________________________________________    Discussed status with Dr. Marland Kitchenon***at *** and received approval for admission today.  Admission Coordinator:  Genella Mech, time***/Date***

## 2021-02-03 NOTE — Progress Notes (Signed)
Physical Therapy Treatment Patient Details Name: Dana Lang MRN: [id number] DOB: 05/15/1956 Today's Date: 02/03/2021    History of Present Illness 65 yo female presenting 4/6 for craniotomy and excision of L frontal meningioma. The pt has experienced headaches and R-sided weakness since March 2021. PMH includes: hearing loss, OA, peptic ulcer disease, UTIs, and anemia.    PT Comments    Pt up in chair, demonstrating significant expressive difficulties requiring increased time and effort to speak in one-word responses to PT/OT (RN notified, SLP consult to be placed). Pt requiring max +2 assist for repeated standing, pre-gait activity, and x3 ft gait training. Pt requires RLE blocking during stance phase of gait and significant swing phase assist, as well as significant truncal and weight shifting support. Pt limited in gait distance by fatigue and anxiety, sits abruptly and benefits from chair follow. PT continuing to recommend CIR level of therapies post-acutely.     Follow Up Recommendations  CIR     Equipment Recommendations  Other (comment) (defer to post acute)    Recommendations for Other Services Speech consult     Precautions / Restrictions Precautions Precautions: Fall Precaution Comments: staples in skull Restrictions Weight Bearing Restrictions: No    Mobility  Bed Mobility Overal bed mobility: Needs Assistance             General bed mobility comments: up in chair upon PT/OT arrival to room    Transfers Overall transfer level: Needs assistance Equipment used: 2 person hand held assist Transfers: Sit to/from Stand Sit to Stand: Max assist;+2 physical assistance         General transfer comment: max +2 for power up, rise, posterior truncal extension via tactile facilitation, RLE blocking in knee extension, and sternal facilitation for chest opening/upright posture. STS x3 from chair, with weight shifting L and R with R knee  blocked.  Ambulation/Gait Ambulation/Gait assistance: Max assist;+2 physical assistance;+2 safety/equipment (PT, OT, and chair follow) Gait Distance (Feet): 3 Feet Assistive device: 2 person hand held assist Gait Pattern/deviations: Step-to pattern;Decreased step length - right;Decreased weight shift to right;Trunk flexed;Decreased dorsiflexion - right Gait velocity: decr   General Gait Details: max +2 for supporting trunk, weight shifting laterally, RLE swing phase and stance phase tactile facilitation with stance phase requiring R knee blocking. PT sitting in chair to pt's R to manage RLE, OT supporting LUE over OT's shoulders and managing truncal support and weight shifting.   Stairs             Wheelchair Mobility    Modified Rankin (Stroke Patients Only) Modified Rankin (Stroke Patients Only) Pre-Morbid Rankin Score: Slight disability Modified Rankin: Moderately severe disability     Balance Overall balance assessment: Needs assistance Sitting-balance support: Single extremity supported;Feet supported Sitting balance-Leahy Scale: Poor Sitting balance - Comments: minG with single UE support Postural control: Posterior lean Standing balance support: During functional activity;Bilateral upper extremity supported Standing balance-Leahy Scale: Poor Standing balance comment: BUE support with maxA +2                            Cognition Arousal/Alertness: Awake/alert Behavior During Therapy: WFL for tasks assessed/performed Overall Cognitive Status: Impaired/Different from baseline Area of Impairment: Safety/judgement;Awareness                         Safety/Judgement: Decreased awareness of safety;Decreased awareness of deficits Awareness: Emergent   General Comments: Expressive difficulties noted, pt responds "  yep" to several questions, requires cues to try again, word finding and effortful speech difficulty throughout session. Pt inattentive to  R side, requires multimodal cuing to attend to R and benefits from cue "look at your R arm/leg". Pt consistently following commands      Exercises General Exercises - Lower Extremity Long Arc Quad: PROM;Right;15 reps;Seated Hip Flexion/Marching: PROM;Right;10 reps;Seated    General Comments        Pertinent Vitals/Pain Pain Assessment: Faces Faces Pain Scale: Hurts little more Pain Location: R knee, with terminal knee extension Pain Descriptors / Indicators: Sore;Discomfort;Grimacing Pain Intervention(s): Limited activity within patient's tolerance;Monitored during session;Repositioned    Home Living                      Prior Function            PT Goals (current goals can now be found in the care plan section) Acute Rehab PT Goals PT Goal Formulation: With patient Time For Goal Achievement: 02/13/21 Potential to Achieve Goals: Fair Progress towards PT goals: Progressing toward goals    Frequency    Min 4X/week      PT Plan Current plan remains appropriate    Co-evaluation PT/OT/SLP Co-Evaluation/Treatment: Yes Reason for Co-Treatment: Complexity of the patient's impairments (multi-system involvement);For patient/therapist safety;To address functional/ADL transfers PT goals addressed during session: Mobility/safety with mobility;Balance        AM-PAC PT "6 Clicks" Mobility   Outcome Measure  Help needed turning from your back to your side while in a flat bed without using bedrails?: A Lot Help needed moving from lying on your back to sitting on the side of a flat bed without using bedrails?: Total Help needed moving to and from a bed to a chair (including a wheelchair)?: Total Help needed standing up from a chair using your arms (e.g., wheelchair or bedside chair)?: Total Help needed to walk in hospital room?: Total Help needed climbing 3-5 steps with a railing? : Total 6 Click Score: 7    End of Session Equipment Utilized During Treatment: Gait  belt Activity Tolerance: Patient tolerated treatment well Patient left: in chair;with chair alarm set;with call bell/phone within reach Nurse Communication: Mobility status PT Visit Diagnosis: Other abnormalities of gait and mobility (R26.89);Muscle weakness (generalized) (M62.81);Hemiplegia and hemiparesis Hemiplegia - Right/Left: Right Hemiplegia - dominant/non-dominant: Non-dominant Hemiplegia - caused by:  (brain tumor s/p crani)     Time: 3646-8032 PT Time Calculation (min) (ACUTE ONLY): 25 min  Charges:  $Gait Training: 8-22 mins                     Stacie Glaze, PT Acute Rehabilitation Services Pager [phone]  Office [phone]  Roxine Caddy E Ruffin Pyo 02/03/2021, 11:54 AM

## 2021-02-03 NOTE — H&P (Incomplete)
Physical Medicine and Rehabilitation Admission H&P     HPI: Dana Lang is a 65 year old right-handed female with history of hearing loss, recurrent UTIs, hypertension, hyperlipidemia, PTSD with panic attacks, schizophrenia, quit smoking 2 years ago as well as history of left parafalcine mass suspect meningioma.  Per chart review patient lives alone independent with assistive device.  1 level home one-step to entry.  She does have a supportive ex-husband as well as a niece.  Presented 01/28/2021 with known history of left parafalcine frontal meningioma followed by Dr. Christella Noa.  Patient experienced increase in headache and weakness on the right side as well as expressive aphasia.  Admission chemistries unremarkable except creatinine 107.  CT showed a large 4.5 x 4.5 roughly centimeter mass with homogenous enhancement.  Due to patient's increasing headache and right side weakness she underwent craniotomy for left frontal tumor excision 01/28/2021 per Dr. Christella Noa.  Maintained on Decadron protocol.  She was cleared to begin subcutaneous heparin for DVT prophylaxis 01/28/2021.  Keppra for seizure prophylaxis.  Tolerating a regular diet.  Due to patient's right side weakness and decreased functional mobility/aphasia she was admitted for a comprehensive rehab program.  Review of Systems  Constitutional: Negative for chills and fever.  HENT: Positive for hearing loss.   Eyes: Negative for blurred vision and double vision.  Respiratory: Negative for cough and shortness of breath.   Cardiovascular: Negative for chest pain, palpitations and leg swelling.  Gastrointestinal: Positive for constipation. Negative for heartburn, nausea and vomiting.  Genitourinary: Negative for dysuria, flank pain and hematuria.  Musculoskeletal: Positive for joint pain and myalgias.  Skin: Negative for rash.  Neurological: Positive for dizziness, weakness and headaches.  Psychiatric/Behavioral:       Panic attacks with  PTSD/schizophrenia  All other systems reviewed and are negative.  Past Medical History:  Diagnosis Date  . Anemia   . Arthritis   . High cholesterol   . History of bleeding ulcers 1995   did not require blood transfusion. Dr Henrene Pastor could not find in patient records.  . History of blood transfusion   . Hyperlipidemia   . Hypertension   . Osteopenia   . Osteopenia   . Panic attacks   . PTSD (post-traumatic stress disorder)   . Schizophrenia ([place])    per daughter  . Seasonal allergies    Past Surgical History:  Procedure Laterality Date  . ABDOMINAL HYSTERECTOMY    . APPENDECTOMY    . APPLICATION OF CRANIAL NAVIGATION N/A 01/28/2021   Procedure: APPLICATION OF CRANIAL NAVIGATION;  Surgeon: Ashok Pall, MD;  Location: [facility];  Service: Neurosurgery;  Laterality: N/A;  . CRANIOTOMY Left 01/28/2021   Procedure: 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;  Surgeon: Ashok Pall, MD;  Location: [facility];  Service: Neurosurgery;  Laterality: Left;  . Dog bite  1970's   cosmetic surgery  . TUBAL LIGATION     Family History  Problem Relation Age of Onset  . Esophageal cancer Mother   . Heart attack Father 81   Social History:  reports that she quit smoking about 2 years ago. Her smoking use included cigarettes. She has a 10.00 pack-year smoking history. She has never used smokeless tobacco. She reports that she does not drink alcohol and does not use drugs. Allergies:  Allergies  Allergen Reactions  . Codeine Nausea And Vomiting   Medications Prior to Admission  Medication Sig Dispense Refill  . Ascorbic Acid (VITAMIN C PO) Take 1,000 mg by  mouth daily.     Marland Kitchen aspirin 81 MG tablet Take 81 mg by mouth daily.    Marland Kitchen b complex vitamins capsule Take 1 capsule by mouth daily.    . Calcium Carb-Cholecalciferol (CALCIUM 1000 + D PO) Take 1,000 mg by mouth daily.    . cetirizine (ZYRTEC) 10 MG tablet Take 10 mg by mouth daily as needed for allergies.    .  Cholecalciferol (VITAMIN D) 50 MCG (2000 UT) tablet Take 2,000 Units by mouth daily.    . ferrous sulfate 325 (65 FE) MG tablet Take 325 mg by mouth every other day.    . hydrochlorothiazide (HYDRODIURIL) 25 MG tablet Take 25 mg by mouth daily.    . Multiple Vitamin (MULTIVITAMIN) capsule Take 1 capsule by mouth daily.    Marland Kitchen PRESCRIPTION MEDICATION Inject into the skin every 30 (thirty) days. Hydrocortisone injection    . rosuvastatin (CRESTOR) 10 MG tablet Take 10 mg by mouth at bedtime.    Marland Kitchen alendronate (FOSAMAX) 70 MG tablet Take 70 mg by mouth once a week.      Drug Regimen Review { DRUG REGIMEN HQIONG:[id number]}  Home: Home Living Family/patient expects to be discharged to:: Private residence Living Arrangements: Alone Available Help at Discharge: Other (Comment) (ex husband) Type of Home: House (duplex) Home Access: Stairs to enter Entrance Stairs-Number of Steps: 1 Home Layout: One level Bathroom Shower/Tub: Chiropodist: Standard Bathroom Accessibility: No Home Equipment: Walker - 2 wheels,Shower seat,Grab bars - toilet Additional Comments: reports ex husband will help and niece if she ask her. Niece is taking FMLA until the end of the month   Functional History: Prior Function Level of Independence: Independent with assistive device(s) Comments: pt initially stating use of RW, then states she uses walking stick in the room and that she only has a rollator, not RW anyways. will need to clarify. reports seated showers, states neice assists with IADLs as needed  Functional Status:  Mobility: Bed Mobility Overal bed mobility: Needs Assistance Bed Mobility: Supine to Sit Supine to sit: Max assist,HOB elevated Sit to supine: Max assist,+2 for physical assistance General bed mobility comments: maxA of 1 to move RLE to EOB and then pt able to move LLE to EOB and pull up on PT to pull to sit EOB. modA to steady initially, then minG with moments of minA for  posterior LOB Transfers Overall transfer level: Needs assistance Equipment used: Ambulation equipment used Transfer via Lift Equipment: Stedy Transfers: Sit to/from [employer] to Stand: Max assist,+2 physical assistance,From elevated surface Stand pivot transfers: Total assist (in stedy) General transfer comment: maxA from elevated bed, pulling with LUE, slow rise with increased cues, tactile cues at hips for hip ext, cues to attend to RUE for grip Ambulation/Gait General Gait Details: deferred due to pt inability to stand without stedy    ADL: ADL Overall ADL's : Needs assistance/impaired Grooming: Wash/dry face,Minimal assistance,Sitting Upper Body Bathing: Moderate assistance Lower Body Bathing: Maximal assistance Lower Body Dressing: Total assistance Toilet Transfer: +2 for physical assistance,Maximal assistance Toilet Transfer Details (indicate cue type and reason): sit<>Stand at eob only General ADL Comments: pt with R LE flaccid recommendation for bed pan or purewick for d/c home  Cognition: Cognition Overall Cognitive Status: Impaired/Different from baseline Orientation Level: Oriented X4 Cognition Arousal/Alertness: Awake/alert Behavior During Therapy: WFL for tasks assessed/performed Overall Cognitive Status: Impaired/Different from baseline Area of Impairment: Safety/judgement,Awareness Safety/Judgement: Decreased awareness of safety,Decreased awareness of deficits Awareness: Emergent General Comments: Expressive  difficulties noted, pt responds "yep" to several questions, requires cues to try again, word finding and effortful speech difficulty throughout session. Pt inattentive to R side, requires multimodal cuing to attend to R and benefits from cue "look at your R arm/leg". Pt consistently following commands  Physical Exam: Blood pressure 132/77, pulse 87, temperature 98 F (36.7 C), temperature source Oral, resp. rate 17, height 5\' 1"  (1.549  m), weight 93.1 kg, SpO2 96 %. Physical Exam HENT:     Head:     Comments: Craniotomy site clean and dry Neurological:     Comments: Patient is alert no acute distress.  Expressive receptive aphasia.  She does follow some simple commands provides her name with some perseveration.     No results found for this or any previous visit (from the past 48 hour(s)). No results found.     Medical Problem List and Plan: 1.  Right-sided weakness with headache as well as expressive receptive aphasia secondary to meningioma.  Status post resection 01/28/2021.  Decadron protocol  -patient may *** shower  -ELOS/Goals: *** 2.  Antithrombotics: -DVT/anticoagulation: Subcutaneous heparin.  -antiplatelet therapy: Aspirin 81 mg daily 3. Pain Management: Hydrocodone as needed 4. Mood/PTSD/schizophrenia: Provide emotional support  -antipsychotic agents: N/A 5. Neuropsych: This patient is capable of making decisions on her own behalf. 6. Skin/Wound Care: Routine skin checks 7. Fluids/Electrolytes/Nutrition: Routine in and outs with follow-up chemistries 8.  Seizure prophylaxis.  Keppra 500 mg every 12 hours 9.  Hyperlipidemia.  Crestor 10.  Hypertension.  Hydrochlorothiazide 25 mg daily.  Monitor mobility    ***  Cathlyn Parsons, PA-C 02/03/2021

## 2021-02-03 NOTE — Progress Notes (Signed)
Patient ID: Dana Lang, female   DOB: Sep 10, 1956, 65 y.o.   MRN: [id number] BP 112/68   Pulse 67   Temp 98.4 F (36.9 C) (Oral)   Resp 16   Ht 5\' 1"  (1.549 m)   Wt 93.1 kg   SpO2 94%   BMI 38.78 kg/m  Alert, non fluent, expressive aphasia, perseverating Minimal movement in right lower extremity Wound is clean, dry, without signs of infection Continuing to work with physicAl therapy, occupational therapy, and speech.  Possible CIR placement. I completed a peer to peer conversation with insurance company this morning. We have submitted requested information.  Exam remains the same.

## 2021-02-03 NOTE — Progress Notes (Signed)
Inpatient Rehab Admissions Coordinator:     Peer to Peer completed and updated PT sent to [facility]. I have not received a response from insurance. Pt.'s daughter also has not confirmed 24/7 support. She requested that I contact the VA to see if Pt. Has benefits for SNF/CIR, so I will call today to determine that.   Clemens Catholic, [place], [place] Admissions Coordinator  [phone] ([place]) [phone] (office)

## 2021-02-03 NOTE — Progress Notes (Signed)
Inpatient Rehab Admissions Coordinator:   I confirmed that Pt. Is a service connected veteran with VA benefits. I also discussed patient's case with rehab social work team and they state that Pt. Will likely not be able to use her VA benefits to cover SNF following CIR, so family would need to be prepared to take Pt. Home at likely mod assist level. Pt.'s daughter previously stated that she cannot do this, as she runs a daycare in her home and is caring for her father who is in hospice. If family is unable to take patient home after projected CIR stay, we would recommend SNF level rehab for Pt.  I reached out to patient's daughter to discuss and left a voicemail with request for callback.   Clemens Catholic, [place], [place] Admissions Coordinator  [phone] ([place]) [phone] (office)

## 2021-02-04 NOTE — Progress Notes (Signed)
Inpatient Rehab Admissions Coordinator:    [facility] Medicare has denied request for CIR admission. I spoke with pt.'s daughter and she requested that we attempt to get approval with Pt.'s VA benefits, so I am working on that currently. She also requested that Case Manager move forward with getting [place] for SNF in case the [place] does not approve CIR. Pt.'s daughter is also looking into whether or not the [place] will provide 24/7 in home care following discharge from CIR, as she is not able to provide 24/7 support herself. I will continue to follow for potential admit if the [place] approves Pt.'s case and if her daughter is able to confirm that she will have 24/7 support at discharge.   Clemens Catholic, [place], [place] Admissions Coordinator  [phone] ([place]) [phone] (office)

## 2021-02-04 NOTE — TOC Initial Note (Addendum)
Transition of Care [facility]) - Initial/Assessment Note    Patient Details  Name: Dana Lang MRN: [id number] Date of Birth: 1956/09/13  Transition of Care [facility]) CM/SW Contact:    Vinie Sill, LCSW Phone Number: 02/04/2021, 4:41 PM  Clinical Narrative:                  CSW spoke with patient's daughter, Anderson Malta. CSW introduced self and explained role. CSW discussed short term rehab at [facility].She states she is agreeable to rehab at [facility].  Patient's daughter advised, her father will be admitted into Hospice tomorrow. After taking care of her father she will fly to [facility] to assist with her mother. CSW explained the SNF process. She states no preferred SNF.   CSW will provide bed offers once received  CSW will continue to follow and assist with discharge planning.    Thurmond Butts, MSW, LCSW Clinical Social Worker   Expected Discharge Plan: Skilled Nursing Facility Barriers to Discharge: Continued Medical Work up,Insurance Authorization,SNF Pending bed offer   Patient Goals and CMS Choice        Expected Discharge Plan and Services Expected Discharge Plan: [place] In-house Referral: Clinical Social Work     Living arrangements for the past 2 months: Single Family Home                                      Prior Living Arrangements/Services Living arrangements for the past 2 months: Single Family Home Lives with:: Self Patient language and need for interpreter reviewed:: No        Need for Family Participation in Patient Care: Yes (Comment) Care giver support system in place?: Yes (comment)   Criminal Activity/Legal Involvement Pertinent to Current Situation/Hospitalization: No - Comment as needed  Activities of Daily Living Home Assistive Devices/Equipment: Cane (specify quad or straight),Dentures (specify type),Blood pressure cuff,Eyeglasses ADL Screening (condition at time of admission) Patient's cognitive ability adequate to  safely complete daily activities?: Yes Is the patient deaf or have difficulty hearing?: Yes Does the patient have difficulty seeing, even when wearing glasses/contacts?: Yes Does the patient have difficulty concentrating, remembering, or making decisions?: No Patient able to express need for assistance with ADLs?: Yes Does the patient have difficulty dressing or bathing?: Yes Independently performs ADLs?: No Communication: Independent Dressing (OT): Needs assistance Is this a change from baseline?: Pre-admission baseline Grooming: Needs assistance Is this a change from baseline?: Pre-admission baseline Feeding: Independent Bathing: Needs assistance Is this a change from baseline?: Pre-admission baseline Toileting: Independent In/Out Bed: Independent with device (comment) Walks in Home: Independent with device (comment) Does the patient have difficulty walking or climbing stairs?: Yes Weakness of Legs: Right Weakness of Arms/Hands: Right  Permission Sought/Granted Permission sought to share information with : Family Supports Permission granted to share information with : Yes, Verbal Permission Granted  Share Information with NAME: Deatra Robinson  Permission granted to share info w AGENCY: SNFs  Permission granted to share info w Relationship: [phone]  Permission granted to share info w Contact Information: SNFs  Emotional Assessment       Orientation: : Oriented to Self,Oriented to Place,Oriented to  Time,Oriented to Situation Alcohol / Substance Use: Not Applicable Psych Involvement: No (comment)  Admission diagnosis:  S/P craniotomy [Z98.890] Meningioma [facility]) [D32.9] Patient Active Problem List   Diagnosis Date Noted  . Pressure injury of skin 01/29/2021  . S/P craniotomy 01/28/2021  .  Meningioma ([place]) 01/28/2021  . Chest pain 07/23/2015  . Right arm pain 07/23/2015  . Costochondritis, acute 07/23/2015  . Pre-syncope 07/23/2015  . Tobacco abuse 07/23/2015  .  Atypical chest pain 07/23/2015  . Pain    PCP:  [facility] [place]:   [address], [place] [address] Phone: [phone] Fax: [phone]     Social Determinants of Health (SDOH) Interventions    Readmission Risk Interventions No flowsheet data found.

## 2021-02-04 NOTE — Progress Notes (Signed)
Patient ID: Dana Lang, female   DOB: November 01, 1955, 65 y.o.   MRN: [id number] BP (!) 154/90 (BP Location: Right Arm)   Pulse 95   Temp 99 F (37.2 C) (Oral)   Resp 19   Ht 5\' 1"  (1.549 m)   Wt 93.1 kg   SpO2 98%   BMI 38.78 kg/m  Alert, will follow simple commands Dense plegia right lower extremity Aphasic, receptive and expressive Cir denied. Working on va benefits and snf Stable Do expect improvement in right lower extremity

## 2021-02-04 NOTE — Progress Notes (Addendum)
Inpatient Rehab Admissions Coordinator:   I spoke with Cassie the community care network nurse at the [place] and she states that Pt. Cannot use her VA benefits for CIR because the VA was not notified of admission within 72 hours of admission. She also stated that if Pt. Qualifies for home based care, the maximum coverage is 16 hours a week, so Pt. Would not have 24/7 assistance from the [place]. I relayed this to pt.'s daughter and she states that she would like to pursue rehab in SNF using Pt.'s [place]. I have notified Case Manager. CIR will sign off at this time.   Clemens Catholic, [place], [place] Admissions Coordinator  [phone] ([place]) [phone] (office)

## 2021-02-04 NOTE — Evaluation (Signed)
Speech Language Pathology Evaluation Patient Details Name: Dana Lang MRN: [id number] DOB: 05-09-1956 Today's Date: 02/04/2021 Time: 9935-7017 SLP Time Calculation (min) (ACUTE ONLY): 17 min  Problem List:  Patient Active Problem List   Diagnosis Date Noted  . Pressure injury of skin 01/29/2021  . S/P craniotomy 01/28/2021  . Meningioma ([place]) 01/28/2021  . Chest pain 07/23/2015  . Right arm pain 07/23/2015  . Costochondritis, acute 07/23/2015  . Pre-syncope 07/23/2015  . Tobacco abuse 07/23/2015  . Atypical chest pain 07/23/2015  . Pain    Past Medical History:  Past Medical History:  Diagnosis Date  . Anemia   . Arthritis   . High cholesterol   . History of bleeding ulcers 1995   did not require blood transfusion. Dr Henrene Pastor could not find in patient records.  . History of blood transfusion   . Hyperlipidemia   . Hypertension   . Osteopenia   . Osteopenia   . Panic attacks   . PTSD (post-traumatic stress disorder)   . Schizophrenia ([place])    per daughter  . Seasonal allergies    Past Surgical History:  Past Surgical History:  Procedure Laterality Date  . ABDOMINAL HYSTERECTOMY    . APPENDECTOMY    . APPLICATION OF CRANIAL NAVIGATION N/A 01/28/2021   Procedure: APPLICATION OF CRANIAL NAVIGATION;  Surgeon: Ashok Pall, MD;  Location: [facility];  Service: Neurosurgery;  Laterality: N/A;  . CRANIOTOMY Left 01/28/2021   Procedure: 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;  Surgeon: Ashok Pall, MD;  Location: [facility];  Service: Neurosurgery;  Laterality: Left;  . Dog bite  1970's   cosmetic surgery  . TUBAL LIGATION     HPI:  65 yo female presenting 4/6 for craniotomy and excision of L frontal meningioma. The pt has experienced headaches and R-sided weakness since March 2021. PMH schizophrenia, panic attacks, hearing loss, OA, peptic ulcer disease, UTIs, and anemia   Assessment / Plan / Recommendation Clinical Impression  Pt presents with  mild receptive aphaisa and verbal apraxia due to impairments in the motor planning system. Dysfluent language marked by frequent hesitations, phonemic paraphasias and apraxic articulatory inaccuracies. Expression is primarily at the word level with attempts at short phrases. She is stimulable for visual feedback cues and able to repeat words and short phrases without significant difficulty. Comprehension for basic 1 step commands and yes no questions ranged 70-90%. Continued ST to increase communicative independence.    SLP Assessment  SLP Recommendation/Assessment: Patient needs continued Speech Lanaguage Pathology Services SLP Visit Diagnosis: Aphasia (R47.01)    Follow Up Recommendations  Inpatient Rehab    Frequency and Duration min 2x/week  2 weeks      SLP Evaluation Cognition  Overall Cognitive Status: Impaired/Different from baseline Arousal/Alertness: Awake/alert Orientation Level: Oriented X4 (able to express with paraphasias and yes/no's) Attention: Sustained Sustained Attention: Appears intact Memory:  (TBA) Awareness: Impaired Awareness Impairment: Anticipatory impairment Problem Solving:  (will assess further) Safety/Judgment:  (suspect adequate- will assess further)       Comprehension  Auditory Comprehension Overall Auditory Comprehension: Impaired Yes/No Questions: Impaired (90%) Commands: Impaired One Step Basic Commands: 50-74% accurate (70%) Visual Recognition/Discrimination Discrimination: Not tested Reading Comprehension Reading Status:  (will assess)    Expression Expression Primary Mode of Expression: Verbal Verbal Expression Overall Verbal Expression: Impaired Initiation: Impaired Level of Generative/Spontaneous Verbalization: Word Repetition: Impaired Level of Impairment: Phrase level;Sentence level Naming: Impairment Confrontation:  (70% for pictures) Convergent: Not tested Divergent: Not tested Verbal Errors:  Phonemic paraphasias;Aware of  errors;Neologisms Pragmatics: No impairment Written Expression Dominant Hand: Left Written Expression: Not tested   Oral / Motor  Oral Motor/Sensory Function Overall Oral Motor/Sensory Function: Within functional limits Motor Speech Overall Motor Speech: Appears within functional limits for tasks assessed Respiration: Within functional limits Phonation: Normal Resonance: Within functional limits Articulation: Within functional limitis (difficulties are motor planning) Intelligibility: Intelligible Motor Planning: Impaired Level of Impairment: Word   GO                    Houston Siren 02/04/2021, 3:21 PM  Orbie Pyo Jodell Weitman M.Ed Risk analyst [phone] Office [phone]

## 2021-02-04 NOTE — Progress Notes (Signed)
Physical Therapy Treatment Patient Details Name: Dana Lang MRN: [id number] DOB: 08-04-1956 Today's Date: 02/04/2021    History of Present Illness 65 yo female presenting 4/6 for craniotomy and excision of L frontal meningioma. The pt has experienced headaches and R-sided weakness since March 2021. PMH includes: hearing loss, OA, peptic ulcer disease, UTIs, and anemia.    PT Comments    Focused session on further progressing bed mobility and transfers independence and safety, but pt limited primarily by pain in her R knee this date. Adjusted positioning and blocking with standing to ensure no buckling or hyperextension but she continued to have R knee pain. Pt motivated to participate but limited in advancing gait due to knee pain at this time. Thus, pt required maxAx2 for sit to stand and stand pivot transfers towards the L this date. Changed d/c recs to SNF per chart reporting insurance denied CIR and pt's daughter thereby requesting SNF. Will continue to follow acutely.    Follow Up Recommendations  SNF (insurance denied CIR)     Equipment Recommendations  Other (comment) (defer to post acute)    Recommendations for Other Services       Precautions / Restrictions Precautions Precautions: Fall Precaution Comments: staples in skull Restrictions Weight Bearing Restrictions: No    Mobility  Bed Mobility Overal bed mobility: Needs Assistance Bed Mobility: Supine to Sit     Supine to sit: Max assist;HOB elevated     General bed mobility comments: Cues for pt to reach with L UE to R bed rail, maxA to manage legs and trunk to transition to sitting EOB.    Transfers Overall transfer level: Needs assistance Equipment used: 2 person hand held assist Transfers: Sit to/from [facility] Sit to Stand: Max assist;+2 physical assistance Stand pivot transfers: Max assist;+2 physical assistance       General transfer comment: MaxAx2 with physical assistance  under buttocks and R knee blocked into extension initially, but pt reporting pain. Returned to sit and provided blocking anteriorly and posteriorly to R knee to prevent buckling or hyperextension, x2 reps but pt still reporting pain. MaxAx2 with PT anterior to pt and rehab tech posteriorly directing buttocks for stand pivot to L EOB > recliner.  Ambulation/Gait             General Gait Details: Pt with too much pain in R knee to ambulate today.   Stairs             Wheelchair Mobility    Modified Rankin (Stroke Patients Only) Modified Rankin (Stroke Patients Only) Pre-Morbid Rankin Score: Slight disability Modified Rankin: Severe disability     Balance Overall balance assessment: Needs assistance Sitting-balance support: Single extremity supported;Feet supported Sitting balance-Leahy Scale: Poor Sitting balance - Comments: minG with single UE support   Standing balance support: During functional activity;Bilateral upper extremity supported Standing balance-Leahy Scale: Poor Standing balance comment: Bil UE support with maxA +2                            Cognition Arousal/Alertness: Awake/alert Behavior During Therapy: WFL for tasks assessed/performed Overall Cognitive Status: Impaired/Different from baseline Area of Impairment: Safety/judgement;Awareness;Attention                   Current Attention Level: Sustained     Safety/Judgement: Decreased awareness of safety;Decreased awareness of deficits Awareness: Intellectual   General Comments: Expressive difficulties noted, pt responds "yep" to several questions, requires  cues to try again, word finding and effortful speech difficulty throughout session. Pt inattentive to R side, requires multimodal cuing to attend to R and benefits from cue "look at your R arm/leg". Pt consistently following commands      Exercises General Exercises - Lower Extremity Long Arc Quad: Strengthening;Both;10  reps;Seated (no muscle activation noted with palpation on R)    General Comments        Pertinent Vitals/Pain Pain Assessment: Faces Faces Pain Scale: Hurts whole lot Pain Location: R knee, with terminal knee extension Pain Descriptors / Indicators: Sore;Discomfort;Grimacing Pain Intervention(s): Limited activity within patient's tolerance;Monitored during session;Repositioned    Home Living                      Prior Function            PT Goals (current goals can now be found in the care plan section) Acute Rehab PT Goals Patient Stated Goal: to get up to chair PT Goal Formulation: With patient Time For Goal Achievement: 02/13/21 Potential to Achieve Goals: Fair Progress towards PT goals: Progressing toward goals    Frequency    Min 3X/week      PT Plan Discharge plan needs to be updated;Frequency needs to be updated    Co-evaluation              AM-PAC PT "6 Clicks" Mobility   Outcome Measure  Help needed turning from your back to your side while in a flat bed without using bedrails?: A Lot Help needed moving from lying on your back to sitting on the side of a flat bed without using bedrails?: A Lot Help needed moving to and from a bed to a chair (including a wheelchair)?: Total Help needed standing up from a chair using your arms (e.g., wheelchair or bedside chair)?: Total Help needed to walk in hospital room?: Total Help needed climbing 3-5 steps with a railing? : Total 6 Click Score: 8    End of Session Equipment Utilized During Treatment: Gait belt Activity Tolerance: Patient limited by pain Patient left: in chair;with call bell/phone within reach;with chair alarm set   PT Visit Diagnosis: Other abnormalities of gait and mobility (R26.89);Muscle weakness (generalized) (M62.81);Hemiplegia and hemiparesis;Unsteadiness on feet (R26.81);Difficulty in walking, not elsewhere classified (R26.2) Hemiplegia - Right/Left: Right Hemiplegia -  dominant/non-dominant: Non-dominant Hemiplegia - caused by:  (brain tumor s/p crani)     Time: 0017-4944 PT Time Calculation (min) (ACUTE ONLY): 20 min  Charges:  $Therapeutic Activity: 8-22 mins                     Moishe Spice, PT, DPT Acute Rehabilitation Services  Pager: [phone] Office: [place] 02/04/2021, 7:09 PM

## 2021-02-04 NOTE — Care Management Important Message (Signed)
Important Message  Patient Details  Name: Dana Lang MRN: [id number] Date of Birth: Apr 01, 1956   Medicare Important Message Given:  Yes     Orbie Pyo 02/04/2021, 11:57 AM

## 2021-02-04 NOTE — NC FL2 (Signed)
[place] LEVEL OF CARE SCREENING TOOL     IDENTIFICATION  Patient Name: Dana Lang Birthdate: Jan 09, 1956 Sex: female Admission Date (Current Location): 01/28/2021  [facility] and [place] Number:  Herbalist and Address:  [facility], [place] [address] [place], [place] [address]      Provider Number: [id number]  Attending Physician Name and Address:  Ashok Pall, MD  Relative Name and Phone Number:       Current Level of Care: Hospital Recommended Level of Care: [place] Prior Approval Number:    Date Approved/Denied:   PASRR Number: [id number]  Discharge Plan: SNF    Current Diagnoses: Patient Active Problem List   Diagnosis Date Noted  . Pressure injury of skin 01/29/2021  . S/P craniotomy 01/28/2021  . Meningioma ([place]) 01/28/2021  . Chest pain 07/23/2015  . Right arm pain 07/23/2015  . Costochondritis, acute 07/23/2015  . Pre-syncope 07/23/2015  . Tobacco abuse 07/23/2015  . Atypical chest pain 07/23/2015  . Pain     Orientation RESPIRATION BLADDER Height & Weight     Self,Time,Situation,Place  Normal External catheter,Incontinent Weight: 205 lb 4 oz (93.1 kg) Height:  5\' 1"  (154.9 cm)  BEHAVIORAL SYMPTOMS/MOOD NEUROLOGICAL BOWEL NUTRITION STATUS      Incontinent Diet (please see discharge summary)  AMBULATORY STATUS COMMUNICATION OF NEEDS Skin   Supervision Verbally Surgical wounds (pressure injury,sacrum mid Lower.Bilateral Stage 2, closed incision , Head)                       Personal Care Assistance Level of Assistance  Bathing,Feeding,Dressing Bathing Assistance: Limited assistance Feeding assistance: Limited assistance Dressing Assistance: Limited assistance     Functional Limitations Info  Sight,Speech,Hearing Sight Info: Adequate Hearing Info: Adequate Speech Info: Adequate    SPECIAL CARE FACTORS FREQUENCY  PT (By licensed PT),OT (By licensed OT)     PT  Frequency: 5x per week OT Frequency: 5x per week            Contractures Contractures Info: Not present    Additional Factors Info  Code Status,Allergies Code Status Info: FULL Allergies Info: Codeine           Current Medications (02/04/2021):  This is the current hospital active medication list Current Facility-Administered Medications  Medication Dose Route Frequency Provider Last Rate Last Admin  . acetaminophen (TYLENOL) tablet 650 mg  650 mg Oral Q4H PRN Ashok Pall, MD   650 mg at 02/04/21 1541   Or  . acetaminophen (TYLENOL) suppository 650 mg  650 mg Rectal Q4H PRN Ashok Pall, MD      . ascorbic acid (VITAMIN C) tablet 1,000 mg  1,000 mg Oral Daily Ashok Pall, MD   1,000 mg at 02/04/21 0918  . aspirin EC tablet 81 mg  81 mg Oral Daily Ashok Pall, MD   81 mg at 02/04/21 0918  . bisacodyl (DULCOLAX) EC tablet 5 mg  5 mg Oral Daily PRN Ashok Pall, MD      . cholecalciferol (VITAMIN D3) tablet 2,000 Units  2,000 Units Oral Daily Ashok Pall, MD   2,000 Units at 02/04/21 [phone]  . dexamethasone (DECADRON) tablet 4 mg  4 mg Oral Q12H Vallarie Mare, MD   4 mg at 02/04/21 3474  . ferrous sulfate tablet 325 mg  325 mg Oral Vernice Jefferson, MD   325 mg at 02/04/21 0918  . heparin injection 5,000 Units  5,000 Units Subcutaneous  Q8H Ashok Pall, MD   5,000 Units at 02/04/21 1501  . hydrochlorothiazide (HYDRODIURIL) tablet 25 mg  25 mg Oral Daily Ashok Pall, MD   25 mg at 02/04/21 0918  . HYDROcodone-acetaminophen (NORCO/VICODIN) 5-325 MG per tablet 1 tablet  1 tablet Oral Q4H PRN Ashok Pall, MD   1 tablet at 01/28/21 2000  . labetalol (NORMODYNE) injection 10-40 mg  10-40 mg Intravenous Q10 min PRN Ashok Pall, MD   10 mg at 01/28/21 1724  . levETIRAcetam (KEPPRA) tablet 500 mg  500 mg Oral Q12H Ashok Pall, MD   500 mg at 02/04/21 0513  . loratadine (CLARITIN) tablet 10 mg  10 mg Oral Daily Ashok Pall, MD   10 mg at 02/04/21 0918  . magnesium  citrate solution 1 Bottle  1 Bottle Oral Once PRN Ashok Pall, MD      . MEDLINE mouth rinse  15 mL Mouth Rinse BID Ashok Pall, MD   15 mL at 02/04/21 0918  . morphine 2 MG/ML injection 1-2 mg  1-2 mg Intravenous Q2H PRN Ashok Pall, MD      . naloxone [facility]) injection 0.08 mg  0.08 mg Intravenous PRN Ashok Pall, MD      . ondansetron (ZOFRAN) tablet 4 mg  4 mg Oral Q4H PRN Ashok Pall, MD       Or  . ondansetron (ZOFRAN) injection 4 mg  4 mg Intravenous Q4H PRN Ashok Pall, MD   4 mg at 01/29/21 1049  . pantoprazole (PROTONIX) EC tablet 40 mg  40 mg Oral QHS Ashok Pall, MD   40 mg at 02/03/21 2140  . promethazine (PHENERGAN) tablet 12.5-25 mg  12.5-25 mg Oral Q4H PRN Ashok Pall, MD      . rosuvastatin (CRESTOR) tablet 10 mg  10 mg Oral QHS Ashok Pall, MD   10 mg at 02/03/21 2140  . senna (SENOKOT) tablet 8.6 mg  1 tablet Oral BID Ashok Pall, MD   8.6 mg at 02/04/21 0918  . senna-docusate (Senokot-S) tablet 1 tablet  1 tablet Oral QHS PRN Ashok Pall, MD         Discharge Medications: Please see discharge summary for a list of discharge medications.  Relevant Imaging Results:  Relevant Lab Results:   Additional Information SSN [id number]  Vinie Sill, LCSW

## 2021-02-05 NOTE — TOC Progression Note (Signed)
Transition of Care [facility]) - Progression Note    Patient Details  Name: Dana Lang MRN: [id number] Date of Birth: 04/01/1956  Transition of Care [facility]) CM/SW [place], [place] Phone Number: 02/05/2021, 4:39 PM  Clinical Narrative:     Faxed clinicals to [facility]- called Admission Director- left voice message to return call  Thurmond Butts, MSW, LCSW Clinical Social Worker   Expected Discharge Plan: [place] Barriers to Discharge: Continued Medical Work up,Insurance Authorization,SNF Pending bed offer  Expected Discharge Plan and Services Expected Discharge Plan: [place] In-house Referral: Clinical Social Work     Living arrangements for the past 2 months: Single Family Home                                       Social Determinants of Health (SDOH) Interventions    Readmission Risk Interventions No flowsheet data found.

## 2021-02-05 NOTE — TOC Progression Note (Signed)
Transition of Care [facility]) - Progression Note    Patient Details  Name: Dana Lang MRN: [id number] Date of Birth: 1956/07/09  Transition of Care [facility]) CM/SW [place], [place] Phone Number: 02/05/2021, 11:22 AM  Clinical Narrative:     CSW spoke with patient's daughter,Jennifer. CSW sent bed offer by text per her requested.  She requested CSW send referral to [facility]  in [place]. CSW [phone], left voice message with Admissions Director Simona Huh- [place] waiting on call back.  Thurmond Butts, MSW, LCSW Clinical Social Worker    Expected Discharge Plan: Skilled Nursing Facility Barriers to Discharge: Continued Medical Work up,Insurance Authorization,SNF Pending bed offer  Expected Discharge Plan and Services Expected Discharge Plan: [place] In-house Referral: Clinical Social Work     Living arrangements for the past 2 months: Single Family Home                                       Social Determinants of Health (SDOH) Interventions    Readmission Risk Interventions No flowsheet data found.

## 2021-02-06 NOTE — Progress Notes (Signed)
SLP Cancellation Note  Patient Details Name: Dana Lang MRN: [id number] DOB: December 27, 1955   Cancelled treatment:        SLP attempted to see this afternoon. Falling asleep when therapist arrived. She typically works with therapist however declined today. SLP encouraged her and explained using different words, y/n questions given her aphasia and continued to decline therapy. Will continue efforts.   [place] Siren 02/06/2021, 4:16 PM   Orbie Pyo Colvin Caroli.Ed Risk analyst [phone] Office [phone]

## 2021-02-06 NOTE — Progress Notes (Signed)
Occupational Therapy Treatment Patient Details Name: Dana Lang MRN: [id number] DOB: 1956-02-17 Today's Date: 02/06/2021    History of present illness 65 yo female presenting 4/6 for craniotomy and excision of L frontal meningioma. The pt has experienced headaches and R-sided weakness since March 2021. PMH includes: hearing loss, OA, peptic ulcer disease, UTIs, and anemia.   OT comments  Pt progressing towards established OT goals. Pt continues to present with aphasia, R sided weakness, poor balance, and decreased activity tolerance. Pt presenting with increased frustration this session; stating things like "time," "tired," and "god." Pt agreeable to OOB to [facility] for BM. Pt requiring Mod A +2 for power up with R knee blocked and Max a +2 for pivot to BSC. Max A +2 for maintaining standing with third person to perform peri care. Continue to recommend dc to post-acute rehab and will continue to follow acutely as admitted.    Follow Up Recommendations  SNF (Insurance denied CIR)    Financial risk analyst (measurements OT);Wheelchair cushion (measurements OT);3 in 1 bedside commode    Recommendations for Other Services Rehab consult    Precautions / Restrictions Precautions Precautions: Fall Precaution Comments: staples in skull       Mobility Bed Mobility Overal bed mobility: Needs Assistance Bed Mobility: Supine to Sit     Supine to sit: Mod assist;HOB elevated;+2 for physical assistance Sit to supine: Max assist;+2 for physical assistance   General bed mobility comments: Assistance to initate bringing RLE towards EOB. Mod A for elevating trunk and then bringing hips towards EOB; +2. Max A +2 for returning to supine    Transfers Overall transfer level: Needs assistance Equipment used: Rolling walker (2 wheeled);2 person hand held assist Transfers: Sit to/from [facility] Sit to Stand: Mod assist;+2 physical assistance;+2  safety/equipment;From elevated surface Stand pivot transfers: Max assist;+2 physical assistance       General transfer comment: Max A +2 for power up into standing with RW. Performing sit<>stand from bed and BSC without RW and demonstrating increased power up. Mod A +2 for power up with R knee blocked. Max A +2 for maintaining balance while pivoting to/from [facility]    Balance Overall balance assessment: Needs assistance Sitting-balance support: Single extremity supported;Feet supported Sitting balance-Leahy Scale: Poor Sitting balance - Comments: minG with single UE support   Standing balance support: Bilateral upper extremity supported;During functional activity Standing balance-Leahy Scale: Poor Standing balance comment: Bil UE support with maxA +2                           ADL either performed or assessed with clinical judgement   ADL Overall ADL's : Needs assistance/impaired     Grooming: Wash/dry face;Min guard;Sitting                   Toilet Transfer: Maximal Production assistant, radio Details (indicate cue type and reason): Max A +2 for pivot to [facility] Toileting- Clothing Manipulation and Hygiene: Maximal assistance;+2 for physical assistance;+2 for safety/equipment;Sit to/from stand Toileting - Clothing Manipulation Details (indicate cue type and reason): Mod A +2 for power up into standing with R knee blocked. Third person to perform posterior peri care     Functional mobility during ADLs: Maximal assistance;+2 for physical assistance General ADL Comments: Pt presenting with increased frustration this session. agreeable to transfer to [facility] for [place]  Cognition Arousal/Alertness: Awake/alert Behavior During Therapy: Anxious (Frustrated) Overall Cognitive Status: Impaired/Different from baseline Area of Impairment: Safety/judgement;Awareness;Attention                   Current Attention  Level: Sustained     Safety/Judgement: Decreased awareness of safety;Decreased awareness of deficits Awareness: Intellectual   General Comments: Pt presenting with increased frustration this session. Continues to present with expressive difficulties and mainly stating "yeah" or "no". When frustrated, pt able to verablize a few words together such as "I do not want that" and "thank you very much". Becoming more frustrated with constipation.        Exercises     Shoulder Instructions       General Comments VSS    Pertinent Vitals/ Pain       Pain Assessment: Faces Faces Pain Scale: Hurts whole lot Pain Location: R knee Pain Descriptors / Indicators: Sore;Discomfort;Grimacing Pain Intervention(s): Monitored during session;Repositioned;Other (comment) (ace wrap)  Home Living                                          Prior Functioning/Environment              Frequency  Min 2X/week        Progress Toward Goals  OT Goals(current goals can now be found in the care plan section)  Progress towards OT goals: Progressing toward goals  Acute Rehab OT Goals Patient Stated Goal: to get up to chair OT Goal Formulation: With patient Time For Goal Achievement: 02/13/21 Potential to Achieve Goals: Good ADL Goals Pt Will Perform Grooming: sitting;with min guard assist Pt Will Perform Upper Body Bathing: with min guard assist;sitting Pt Will Transfer to Toilet: with +2 assist;with max assist;stand pivot transfer Additional ADL Goal #1: pt will complete bed mobility max (A) as precursor to adls. Additional ADL Goal #2: pt will complete basic transfer total +2 max (A) using sliding board  Plan Discharge plan remains appropriate    Co-evaluation    PT/OT/SLP Co-Evaluation/Treatment: Yes Reason for Co-Treatment: For patient/therapist safety;To address functional/ADL transfers   OT goals addressed during session: ADL's and self-care      AM-PAC OT "6  Clicks" Daily Activity     Outcome Measure   Help from another person eating meals?: A Lot Help from another person taking care of personal grooming?: A Lot Help from another person toileting, which includes using toliet, bedpan, or urinal?: A Lot Help from another person bathing (including washing, rinsing, drying)?: A Lot Help from another person to put on and taking off regular upper body clothing?: A Lot Help from another person to put on and taking off regular lower body clothing?: A Lot 6 Click Score: 12    End of Session Equipment Utilized During Treatment: Gait belt;Rolling walker  OT Visit Diagnosis: Unsteadiness on feet (R26.81);Muscle weakness (generalized) (M62.81);Pain Pain - Right/Left: Right Pain - part of body: Knee   Activity Tolerance Patient limited by fatigue;Patient tolerated treatment well   Patient Left in bed;with call bell/phone within reach;with bed alarm set;with SCD's reapplied   Nurse Communication Mobility status        Time: 4765-4650 OT Time Calculation (min): 40 min  Charges: OT General Charges $OT Visit: 1 Visit OT Treatments $Self Care/Home Management : 23-37 mins  [place], OTR/L Acute Rehab Pager: [phone] Office: [place]  02/06/2021, 5:16 PM

## 2021-02-06 NOTE — Progress Notes (Signed)
Physical Therapy Treatment Patient Details Name: Dana Lang MRN: [id number] DOB: [birth year] Today's Date: 02/06/2021    History of Present Illness 65 yo female presenting 4/6 for craniotomy and excision of L frontal meningioma. The pt has experienced headaches and R-sided weakness since March 2021. PMH includes: hearing loss, OA, peptic ulcer disease, UTIs, and anemia.    PT Comments    Pt agreeable to session and motivated to improve, but demonstrated increased frustration this date, especially when attempting to motor plan or sequence tasks. Pt requiring multi-modal simple cues and extended periods of time for all mobility. ACE wrap applied to R knee to facilitate knee flexion to prevent hyperextension and pain with standing, pt reported success. Pt continuing to require mod-maxAx2 for all functional mobility, including short gait distances. Pt with hip adduction on R when standing, needing cues and physical assistance to keep lateral for safety purposes. Will continue to follow acutely. Current recommendations remain appropriate.    Follow Up Recommendations  SNF (insurance denied CIR)     Equipment Recommendations  Other (comment) (defer to post acute)    Recommendations for Other Services       Precautions / Restrictions Precautions Precautions: Fall Precaution Comments: staples in skull Restrictions Weight Bearing Restrictions: No    Mobility  Bed Mobility Overal bed mobility: Needs Assistance Bed Mobility: Supine to Sit     Supine to sit: Mod assist;HOB elevated;+2 for physical assistance Sit to supine: Max assist;+2 for physical assistance   General bed mobility comments: Assistance to initate bringing RLE towards EOB. Mod A for elevating trunk and then bringing hips towards EOB; +2. Max A +2 for returning to supine    Transfers Overall transfer level: Needs assistance Equipment used: 2 person hand held assist;Rolling walker (2 wheeled) Transfers: Sit  to/from [facility] Sit to Stand: Mod assist;+2 physical assistance;+2 safety/equipment;From elevated surface Stand pivot transfers: Max assist;+2 physical assistance       General transfer comment: Max A +2 for power up into standing with RW. Performing sit<>stand from bed and BSC without RW  but bil HHA on therapiests and demonstrating increased power up. Mod A +2 for power up with R knee blocked. Max A +2 for maintaining balance while pivoting/stepping to/from [facility] with blocking of R knee and monitoring prevention of hyperextension.  Ambulation/Gait Ambulation/Gait assistance: Max assist;+2 physical assistance;+2 safety/equipment Gait Distance (Feet): 2 Feet (x2 bouts) Assistive device: 2 person hand held assist Gait Pattern/deviations: Step-to pattern;Decreased step length - right;Decreased weight shift to right;Trunk flexed;Decreased dorsiflexion - right Gait velocity: decr Gait velocity interpretation: <1.31 ft/sec, indicative of household ambulator General Gait Details: ACE wrap applied to R knee to prevent hyperextension in stance, with monitoring during gait and blocking to prevent buckling. MaxAx2 with bil HHA to weight shift and advance legs to stand step <> commode. Pt with tendency to adduct R leg, needing assistance to maintain proper position.   Stairs             Wheelchair Mobility    Modified Rankin (Stroke Patients Only) Modified Rankin (Stroke Patients Only) Pre-Morbid Rankin Score: Slight disability Modified Rankin: Severe disability     Balance Overall balance assessment: Needs assistance Sitting-balance support: Single extremity supported;Feet supported Sitting balance-Leahy Scale: Poor Sitting balance - Comments: minG with single UE support   Standing balance support: Bilateral upper extremity supported;During functional activity Standing balance-Leahy Scale: Poor Standing balance comment: Bil UE support with maxA +2  Cognition Arousal/Alertness: Awake/alert Behavior During Therapy: Anxious (Frustrated) Overall Cognitive Status: Impaired/Different from baseline Area of Impairment: Safety/judgement;Awareness;Attention                   Current Attention Level: Sustained     Safety/Judgement: Decreased awareness of safety;Decreased awareness of deficits Awareness: Intellectual   General Comments: Pt presenting with increased frustration this session. Continues to present with expressive difficulties and mainly stating "yeah" or "no". When frustrated, pt able to verablize a few words together such as "I do not want that" and "thank you very much". Becoming more frustrated with constipation.      Exercises      General Comments General comments (skin integrity, edema, etc.): VSS      Pertinent Vitals/Pain Pain Assessment: Faces Faces Pain Scale: Hurts whole lot Pain Location: R knee Pain Descriptors / Indicators: Sore;Discomfort;Grimacing Pain Intervention(s): Monitored during session;Limited activity within patient's tolerance;Repositioned;Other (comment) (ACE wrapped to encourage knee flexion to prevent hyperextension in stance)    Home Living                      Prior Function            PT Goals (current goals can now be found in the care plan section) Acute Rehab PT Goals Patient Stated Goal: to go to bathroom PT Goal Formulation: With patient Time For Goal Achievement: 02/13/21 Potential to Achieve Goals: Fair Progress towards PT goals: Progressing toward goals    Frequency    Min 3X/week      PT Plan Current plan remains appropriate    Co-evaluation PT/OT/SLP Co-Evaluation/Treatment: Yes Reason for Co-Treatment: For patient/therapist safety;To address functional/ADL transfers PT goals addressed during session: Mobility/safety with mobility;Balance OT goals addressed during session: ADL's and self-care      AM-PAC PT "6  Clicks" Mobility   Outcome Measure  Help needed turning from your back to your side while in a flat bed without using bedrails?: A Lot Help needed moving from lying on your back to sitting on the side of a flat bed without using bedrails?: A Lot Help needed moving to and from a bed to a chair (including a wheelchair)?: Total Help needed standing up from a chair using your arms (e.g., wheelchair or bedside chair)?: A Lot Help needed to walk in hospital room?: Total Help needed climbing 3-5 steps with a railing? : Total 6 Click Score: 9    End of Session Equipment Utilized During Treatment: Gait belt Activity Tolerance: Other (comment);No increased pain (limited by frustration) Patient left: in bed;with call bell/phone within reach;with bed alarm set;with nursing/sitter in room Nurse Communication: Mobility status PT Visit Diagnosis: Other abnormalities of gait and mobility (R26.89);Muscle weakness (generalized) (M62.81);Hemiplegia and hemiparesis;Unsteadiness on feet (R26.81);Difficulty in walking, not elsewhere classified (R26.2) Hemiplegia - Right/Left: Right Hemiplegia - dominant/non-dominant: Non-dominant Hemiplegia - caused by:  (brain tumor s/p crani)     Time: 9528-4132 PT Time Calculation (min) (ACUTE ONLY): 40 min  Charges:  $Therapeutic Activity: 8-22 mins                     Moishe Spice, PT, DPT Acute Rehabilitation Services  Pager: [phone] Office: [phone]    Orvan Falconer 02/06/2021, 5:31 PM

## 2021-02-06 NOTE — Progress Notes (Signed)
Patient ID: Dana Lang, female   DOB: 10-16-1956, 65 y.o.   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  Patient opens eyes and responds minimally moves all 4 extremities will follow some simple commands.  She is awaiting placement and further rehab as she recovers from surgery.  No new changes noted.

## 2021-02-06 NOTE — TOC Progression Note (Signed)
Transition of Care [facility]) - Progression Note    Patient Details  Name: PAITON BOULTINGHOUSE MRN: [id number] Date of Birth: 05-09-1956  Transition of Care [facility]) CM/SW [place], [place] Phone Number: 02/06/2021, 9:59 AM  Clinical Narrative:     Received call form [facility]- they are unable to make offer  CSW sent text message to patient's daughter to inform [facility] declined. She  requested [facility]- CSW contacted [facility] and they confirmed bed, however, they do not accept admission over the weekend(short staff, holidays,etc). Pending insurance approval, patient can not d/c until Monday.   TOC team will continue to follow and assist with discharge planning.   Thurmond Butts, MSW, LCSW Clinical Social Worker    Expected Discharge Plan: Skilled Nursing Facility Barriers to Discharge: Continued Medical Work up,Insurance Authorization,SNF Pending bed offer  Expected Discharge Plan and Services Expected Discharge Plan: [place] In-house Referral: Clinical Social Work     Living arrangements for the past 2 months: Single Family Home                                       Social Determinants of Health (SDOH) Interventions    Readmission Risk Interventions No flowsheet data found.

## 2021-02-07 NOTE — Progress Notes (Signed)
Patient ID: Dana Lang, female   DOB: Jul 16, 1956, 65 y.o.   MRN: [id number] BP 123/71 (BP Location: Right Arm)   Pulse 93   Temp 98.3 F (36.8 C) (Oral)   Resp 18   Ht 5\' 1"  (1.549 m)   Wt 93.1 kg   SpO2 93%   BMI 38.78 kg/m  Alert, aphasic Follows commands No movement in right lower extremity, continue to expect improvement Wound is clean, dry, no signs of infection Await placement

## 2021-02-07 NOTE — TOC Progression Note (Signed)
Transition of Care [facility]) - Progression Note    Patient Details  Name: Dana Lang MRN: [id number] Date of Birth: 02-23-1956  Transition of Care [facility]) CM/SW [place], LCSW Phone Number: 02/07/2021, 11:08 AM  Clinical Narrative:    CSW initiated insurance auth for [place].  Reference ID#: [id number]   Expected Discharge Plan: Skilled Nursing Facility Barriers to Discharge: Continued Medical Work up,Insurance Authorization,SNF Pending bed offer  Expected Discharge Plan and Services Expected Discharge Plan: [place] In-house Referral: Clinical Social Work     Living arrangements for the past 2 months: Single Family Home                                       Social Determinants of Health (SDOH) Interventions    Readmission Risk Interventions No flowsheet data found.

## 2021-02-08 NOTE — Progress Notes (Signed)
Patient ID: Dana Lang, female   DOB: [birth year], 65 y.o.   MRN: [id number] BP 128/80 (BP Location: Left Arm)   Pulse 87   Temp 98.7 F (37.1 C) (Oral)   Resp 16   Ht 5\' 1"  (1.549 m)   Wt 93.1 kg   SpO2 95%   BMI 38.78 kg/m  Alert, perseverating Plegic right lower extremity Wound is healing well Moving left lower extremity well Stable Await possible placement , continue therapies

## 2021-02-09 LAB — SARS CORONAVIRUS 2 (TAT 6-24 HRS): SARS Coronavirus 2: NEGATIVE

## 2021-02-09 MED ORDER — GERHARDT'S BUTT CREAM
TOPICAL_CREAM | Freq: Every day | CUTANEOUS | Status: DC
  Filled 2021-02-09: qty 1

## 2021-02-09 NOTE — Progress Notes (Signed)
Patient ID: Dana Lang, female   DOB: [birth year], 65 y.o.   MRN: [id number] BP 124/76 (BP Location: Right Arm)   Pulse 87   Temp 98.6 F (37 C) (Oral)   Resp 16   Ht 5\' 1"  (1.549 m)   Wt 93.1 kg   SpO2 97%   BMI 38.78 kg/m  Alert, expressive aphasia. Perseverating Will follow some commands No voluntary movement in right lower extremity.  Wound is healing well snf recommended as CIR denied by insurance

## 2021-02-09 NOTE — Progress Notes (Signed)
Physical Therapy Treatment Patient Details Name: Dana Lang MRN: [id number] DOB: 14-Mar-1956 Today's Date: 02/09/2021    History of Present Illness 65 yo female presenting 4/6 for craniotomy and excision of L frontal meningioma. The pt has experienced headaches and R-sided weakness since March 2021. PMH includes: hearing loss, OA, peptic ulcer disease, UTIs, and anemia.    PT Comments    The pt was seen today for continued progression of OOB mobility and strengthening. The pt continues to be limited by increased frustration and impulsivity with movement at this time. The pt is eager to participate with PT, and appreciative after session, but quickly becomes frustrated with movement, requiring increased assist to manage impulsivity and decreased safety awareness in addition to managements of deficits in strength and stability in RLE. The pt was able to complete multiple sit-stand tranfers through today's session, but was unable to manage more than 1-2 steps at a time due to poor advancement of RLE and impulsivity with attempts to move LLE despite poor placement or management of RLE even with maxA of 2. The pt will continue to benefit from skilled PT to progress functional strength, mobility, and coordination to facilitate return to improved independence with mobility.    Follow Up Recommendations  SNF (insurance denied CIR)     Equipment Recommendations   (defer to post acute)    Recommendations for Other Services       Precautions / Restrictions Precautions Precautions: Fall Precaution Comments: staples in skull Restrictions Weight Bearing Restrictions: No    Mobility  Bed Mobility Overal bed mobility: Needs Assistance Bed Mobility: Sit to Supine;Rolling Rolling: Mod assist     Sit to supine: Max assist;+2 for physical assistance   General bed mobility comments: assist to bring BLE into bed, then cues and modA to initiate reaching for rolling in bed to place pads and  reposition    Transfers Overall transfer level: Needs assistance Equipment used: 2 person hand held assist;Ambulation equipment used Transfers: Sit to/from [facility] Sit to Stand: Mod assist;+2 physical assistance Stand pivot transfers: Total assist (with stedy)       General transfer comment: modA to power into standing from recliner, pt then became increaseingly impulsive, reaching for UE support other than PT HHA with LUE, stepping with LLE to pivot to bed, but not moving RLE. increased frustration requiring return to sit in recliner and use of stedy to safely return to bed  Ambulation/Gait Ambulation/Gait assistance: Max assist;+2 physical assistance;+2 safety/equipment Gait Distance (Feet): 1 Feet Assistive device: 2 person hand held assist Gait Pattern/deviations: Step-to pattern;Decreased step length - right;Decreased weight shift to right;Trunk flexed;Decreased dorsiflexion - right Gait velocity: decr   General Gait Details: pt with poor safety awareness, increased impuslivity with attempt to ambulate short distance to bed from recliner. unable to advance RLE without assist, impulsively moving LLE prior to assist given to advance RLE. use of stedy to improve safety and allow for cleaning due to incontinence of bowel      Modified Rankin (Stroke Patients Only) Modified Rankin (Stroke Patients Only) Pre-Morbid Rankin Score: Slight disability Modified Rankin: Severe disability     Balance Overall balance assessment: Needs assistance Sitting-balance support: Single extremity supported;Feet supported Sitting balance-Leahy Scale: Poor Sitting balance - Comments: minG with single UE support   Standing balance support: Bilateral upper extremity supported;During functional activity Standing balance-Leahy Scale: Poor Standing balance comment: Bil UE support with maxA +2  Cognition Arousal/Alertness: Awake/alert Behavior  During Therapy: Anxious;Restless Overall Cognitive Status: Impaired/Different from baseline Area of Impairment: Safety/judgement;Awareness;Attention                   Current Attention Level: Sustained     Safety/Judgement: Decreased awareness of safety;Decreased awareness of deficits Awareness: Intellectual   General Comments: increased frustration with attempts at all mobility, pt wanting to mobilize, frustrated by deficits and then making poor decision in regards to safety and impulsivity. Pt incontinent of bowels and unable to express, but verbally appreciative of PT assisting to clean. Pt ability to follow simple commands impacted by frustration and restlessness this session, pt stating "you just dont know" when asked by PT to perform reaching exercise with PT      Exercises      General Comments General comments (skin integrity, edema, etc.): VSS on RA      Pertinent Vitals/Pain Pain Assessment: Faces Faces Pain Scale: Hurts even more Pain Location: knee (pt unable to indicte which one, stated yes when asked about R knee and L knee) Pain Descriptors / Indicators: Sore;Discomfort;Grimacing Pain Intervention(s): Monitored during session;Repositioned           PT Goals (current goals can now be found in the care plan section) Acute Rehab PT Goals Patient Stated Goal: to go to bathroom PT Goal Formulation: With patient Time For Goal Achievement: 02/13/21 Potential to Achieve Goals: Fair Progress towards PT goals: Progressing toward goals    Frequency    Min 3X/week      PT Plan Current plan remains appropriate       AM-PAC PT "6 Clicks" Mobility   Outcome Measure  Help needed turning from your back to your side while in a flat bed without using bedrails?: A Lot Help needed moving from lying on your back to sitting on the side of a flat bed without using bedrails?: A Lot Help needed moving to and from a bed to a chair (including a wheelchair)?:  Total Help needed standing up from a chair using your arms (e.g., wheelchair or bedside chair)?: A Lot Help needed to walk in hospital room?: Total Help needed climbing 3-5 steps with a railing? : Total 6 Click Score: 9    End of Session Equipment Utilized During Treatment: Gait belt Activity Tolerance:  (limited by frustration) Patient left: in bed;with call bell/phone within reach;with bed alarm set Nurse Communication: Mobility status PT Visit Diagnosis: Other abnormalities of gait and mobility (R26.89);Muscle weakness (generalized) (M62.81);Hemiplegia and hemiparesis;Unsteadiness on feet (R26.81);Difficulty in walking, not elsewhere classified (R26.2) Hemiplegia - Right/Left: Right Hemiplegia - dominant/non-dominant: Non-dominant Hemiplegia - caused by:  (brain tumor s/p crani)     Time: 3716-9678 PT Time Calculation (min) (ACUTE ONLY): 30 min  Charges:  $Gait Training: 8-22 mins $Therapeutic Activity: 8-22 mins                     Dana Lang, PT, DPT   Acute Rehabilitation Department Pager #: [phone]   Otho Bellows 02/09/2021, 4:35 PM

## 2021-02-09 NOTE — TOC Progression Note (Addendum)
Transition of Care [facility]) - Progression Note    Patient Details  Name: Dana Lang MRN: [id number] Date of Birth: 05-13-1956  Transition of Care [facility]) CM/SW [place], [place] Phone Number: 02/09/2021, 2:04 PM  Clinical Narrative:    CSW confirmed with [facility] they can take pt today pending negative covid test. Navi ID [id number] Approved 4/18- 4/20 At this time Covid is pending. Anticipate for DC tomorrow if covid does not soon result. [facility] confirmed DC for tomorrow. Daughter updated and agreeable. SW will follow for DC tomorrow.   Expected Discharge Plan: [place] Barriers to Discharge: Continued Medical Work up,Insurance Authorization,SNF Pending bed offer  Expected Discharge Plan and Services Expected Discharge Plan: [place] In-house Referral: Clinical Social Work     Living arrangements for the past 2 months: Single Family Home                                       Social Determinants of Health (SDOH) Interventions    Readmission Risk Interventions No flowsheet data found.

## 2021-02-10 MED ORDER — HYDROCODONE-ACETAMINOPHEN 5-325 MG PO TABS: 1.0000 | ORAL_TABLET | Freq: Four times a day (QID) | ORAL | 0 refills | Status: AC | PRN

## 2021-02-10 MED ORDER — LEVETIRACETAM 500 MG PO TABS: 500.0000 mg | ORAL_TABLET | Freq: Two times a day (BID) | ORAL | 0 refills | Status: DC

## 2021-02-10 MED ORDER — DEXAMETHASONE 4 MG PO TABS: 4.0000 mg | ORAL_TABLET | Freq: Every day | ORAL | 0 refills | Status: AC

## 2021-02-10 NOTE — Progress Notes (Signed)
PT Cancellation Note  Patient Details Name: Dana Lang MRN: [id number] DOB: February 22, 1956   Cancelled Treatment:    Reason Eval/Treat Not Completed: Patient declined, no reason specified- pt adamantly refusing mobility of any kind today, frustrated when continued encouragement stating "God, no!". PT to check back tomorrow per pt request.  Stacie Glaze, PT DPT Acute Rehabilitation Services Pager [phone]  Office [phone]    Louis Matte 02/10/2021, 3:36 PM

## 2021-02-10 NOTE — TOC Progression Note (Signed)
Transition of Care [facility]) - Progression Note    Patient Details  Name: Dana Lang MRN: [id number] Date of Birth: 13-Sep-1956  Transition of Care [facility]) CM/SW [place], [place] Phone Number: 02/10/2021, 11:22 AM  Clinical Narrative:   CSW noting that everything is complete for patient to discharge to SNF, pending discharge orders and summary. CSW contacted neurosurgery office, left a voicemail for MD with his secretary asking for discharge. Awaiting response.    Expected Discharge Plan: Skilled Nursing Facility Barriers to Discharge: SNF Pending discharge summary,SNF Pending discharge orders  Expected Discharge Plan and Services Expected Discharge Plan: [place] In-house Referral: Clinical Social Work     Living arrangements for the past 2 months: Single Family Home                                       Social Determinants of Health (SDOH) Interventions    Readmission Risk Interventions No flowsheet data found.

## 2021-02-10 NOTE — Discharge Summary (Signed)
Physician Discharge Summary  Patient ID: Dana Lang MRN: [id number] DOB/AGE: [birth year] 66 y.o.  Admit date: 01/28/2021 Discharge date: 02/10/2021  Admission [place] parafalcine meningioma  Discharge Diagnoses: same Active Problems:   S/P craniotomy   Meningioma (Dana Lang)   Pressure injury of skin   Discharged Condition: good  Hospital Course: Dana Lang was admitted and taken to the operating room for a craniotomy and tumor resection. Postop she has weakness in the right lower extremity, and both expressive and receptive aphasias. Her wound is clean, dry, and without signs of infection. She will be discharged to a SNF. I do expect improvement in her neurological exam with time. Post operative CT showed gross total tumor resection.   Treatments: surgery: as above  Discharge Exam: Blood pressure 121/67, pulse 80, temperature 98 F (36.7 C), temperature source Oral, resp. rate 17, height 5\' 1"  (1.549 m), weight 93.1 kg, SpO2 97 %. General appearance: alert, cooperative, appears stated age and mild distress  Disposition: Discharge disposition: 03-Skilled Nursing Facility      Meningioma  Allergies as of 02/10/2021      Reactions   Codeine Nausea And Vomiting      Medication List    TAKE these medications   alendronate 70 MG tablet Commonly known as: FOSAMAX Take 70 mg by mouth once a week.   aspirin 81 MG tablet Take 81 mg by mouth daily.   b complex vitamins capsule Take 1 capsule by mouth daily.   CALCIUM 1000 + D PO Take 1,000 mg by mouth daily.   cetirizine 10 MG tablet Commonly known as: ZYRTEC Take 10 mg by mouth daily as needed for allergies.   dexamethasone 4 MG tablet Commonly known as: DECADRON Take 1 tablet (4 mg total) by mouth daily for 4 days.   ferrous sulfate 325 (65 FE) MG tablet Take 325 mg by mouth every other day.   hydrochlorothiazide 25 MG tablet Commonly known as: HYDRODIURIL Take 25 mg by mouth daily.    HYDROcodone-acetaminophen 5-325 MG tablet Commonly known as: NORCO/VICODIN Take 1 tablet by mouth every 6 (six) hours as needed for up to 7 days for moderate pain.   levETIRAcetam 500 MG tablet Commonly known as: KEPPRA Take 1 tablet (500 mg total) by mouth every 12 (twelve) hours for 14 days.   multivitamin capsule Take 1 capsule by mouth daily.   PRESCRIPTION MEDICATION Inject into the skin every 30 (thirty) days. Hydrocortisone injection   rosuvastatin 10 MG tablet Commonly known as: CRESTOR Take 10 mg by mouth at bedtime.   VITAMIN C PO Take 1,000 mg by mouth daily.   Vitamin D 50 MCG (2000 UT) tablet Take 2,000 Units by mouth daily.       Follow-up Information    Ashok Pall, MD Follow up in 3 week(s).   Specialty: Neurosurgery Why: please call to make an appointmernt Contact information: [address] [place] [address] [phone]               Signed: Ashok Pall 02/10/2021, 3:31 PM

## 2021-02-10 NOTE — Discharge Instructions (Signed)
Craniotomy °Care After °Please read the instructions outlined below and refer to this sheet in the next few weeks. These discharge instructions provide you with general information on caring for yourself after you leave the hospital. Your surgeon may also give you specific instructions. While your treatment has been planned according to the most current medical practices available, unavoidable complications occasionally occur. If you have any problems or questions after discharge, please call your surgeon. °Although there are many types of brain surgery, recovery following craniotomy (surgical opening of the skull) is much the same for each. However, recovery depends on many factors. These include the type and severity of brain injury and the type of surgery. It also depends on any nervous system function problems (neurological deficits) before surgery. If the craniotomy was done for cancer, chemotherapy and radiation could follow. You could be in the hospital from 5 days to a couple weeks. This depends on the type of surgery, findings, and whether there are complications. °HOME CARE INSTRUCTIONS  °· It is not unusual to hear a clicking noise after a craniotomy, the plates and screws used to attach the bone flap can sometimes cause this. It is a normal occurrence if this does happen °· Do not drive for 10 days after the operation °· Your scalp may feel spongy for a while, because of fluid under it. This will gradually get better. Occasionally, the surgeon will not replace the bone that was removed to access the brain. If there is a bony defect, the surgeon will ask you to wear a helmet for protection. This is a discussion you should have with your surgeon prior to leaving the hospital (discharge). °· Numbness may persist in some areas of your scalp. °· Take all medications as directed. Sometimes steroids to control swelling are prescribed. Anticonvulsants to prevent seizures may also be given. Do not use alcohol,  other drugs, or medications unless your surgeon says it is OK. °· Keep the wound dry and clean. The wound may be washed gently with soap and water. Then, you may gently blot or dab it dry, without rubbing. Do not take baths, use swimming pools or hot tubs for 10 days, or as instructed by your caregiver. It is best to wait to see you surgeon at your first postoperative visit, and to get directions at that time. °· Only take over-the-counter or prescription medicines for pain, discomfort, or fever as directed by your caregiver. °· You may continue your normal diet, as directed. °· Walking is OK for exercise. Wait at least 3 months before you return to mild, non-contact sports or as your surgeon suggests. Contact sports should be avoided for at least 1 year, unless your surgeon says it is OK. °· If you are prescribed steroids, take them exactly as prescribed. If you start having a decrease in nervous system functions (neurological deficits) and headaches as the dose of steroids is reduced, tell your surgeon right away. °· When the anticonvulsant prescription is finished you no longer need to take it. °SEEK IMMEDIATE MEDICAL CARE IF:  °· You develop nausea, vomiting, severe headaches, confusion, or you have a seizure. °· You develop chest pain, a stiff neck, or difficulty breathing. °· There is redness, swelling, or increasing pain in the wound or pin insertion sites. °· You have an increase in swelling or bruising around the eyes. °· There is drainage or pus coming from the wound. °· You have an oral temperature above 102° F (38.9° C), not controlled by medicine. °·   You notice a foul smell coming from the wound or dressing. °· The wound breaks open (edges not staying together) after the stitches have been removed. °· You develop dizziness or fainting while standing. °· You develop a rash. °· You develop any reaction or side effects to the medications given. °Document Released: 01/11/2006 Document Revised: 01/03/2012  Document Reviewed: 10/20/2009 °Exit[employer]® Patient Information ©2013 [employer]. ° °

## 2021-02-10 NOTE — TOC Transition Note (Signed)
Transition of Care [facility]) - CM/SW Discharge Note   Patient Details  Name: Dana Lang MRN: [id number] Date of Birth: 1955-12-14  [facility] CM/SW Contact:  Geralynn Ochs, LCSW Phone Number: 02/10/2021, 4:08 PM   Clinical Narrative:   Nurse to call report to [phone]. If patient is not picked up by 8:00 PM, please call and cancel pick-up, SNF will not accept.     Final next level of care: Skilled Nursing Facility Barriers to Discharge: Barriers Resolved   Patient Goals and CMS Choice        Discharge Placement              Patient chooses bed at:  ([facility]) Patient to be transferred to facility by: [place] Name of family member notified: Anderson Malta Patient and family notified of of transfer: 02/10/21  Discharge Plan and Services In-house Referral: Clinical Social Work                                   Social Determinants of Health ([place]) Interventions     Readmission Risk Interventions No flowsheet data found.

## 2021-02-10 NOTE — Progress Notes (Addendum)
OT Cancellation Note  Patient Details Name: Dana Lang MRN: [id number] DOB: 1956/06/25   Cancelled Treatment:    Reason Eval/Treat Not Completed: Other (comment) (Requesting "take my time" and agreeable to participate in therpay later today. Will return as schedule allows.)   Second attempt at 1530: Pt declining therapy today. Continues to present with aphasia and is frustrated. When asking about OOB activity, pt stating "no!". Agreeable for therapy tomorrow. Plan for possible dc to SNF today. Will return as schedule allows.   [place], OTR/L Acute Rehab Pager: [phone] Office: [phone] 02/10/2021, 11:54 AM

## 2021-04-16 ENCOUNTER — Other Ambulatory Visit: Payer: Self-pay | Admitting: Neurosurgery

## 2021-04-16 DIAGNOSIS — D329 Benign neoplasm of meninges, unspecified: Secondary | ICD-10-CM

## 2021-05-13 ENCOUNTER — Other Ambulatory Visit: Payer: Self-pay | Admitting: Neurosurgery

## 2021-05-13 DIAGNOSIS — D329 Benign neoplasm of meninges, unspecified: Secondary | ICD-10-CM

## 2021-06-07 IMAGING — CT CT HEAD WO/W CM
1 of 2 series · 13 of 30 positions shown, 17 images · IV contrast (iopamidol)
Comparison: None.
COMPARISON: None.

Addendum:
CLINICAL DATA: Right-sided weakness

EXAM:
CT HEAD WITHOUT AND WITH CONTRAST
TECHNIQUE: Contiguous axial images were obtained from the base of the skull
through the vertex without and with intravenous contrast
CONTRAST:  75mL NEZS7H-OUU IOPAMIDOL (NEZS7H-OUU) INJECTION 61%
Creatinine was obtained on site at [HOSPITAL] at [HOSPITAL].
Results: Creatinine 0.6 mg/dL.

[Series 2: head w/(date) · axial · 0.44mm/px · z∈[+1191,+1316]mm · 13 of 31 slices shown, 17 images]
[im 3/31  brain]
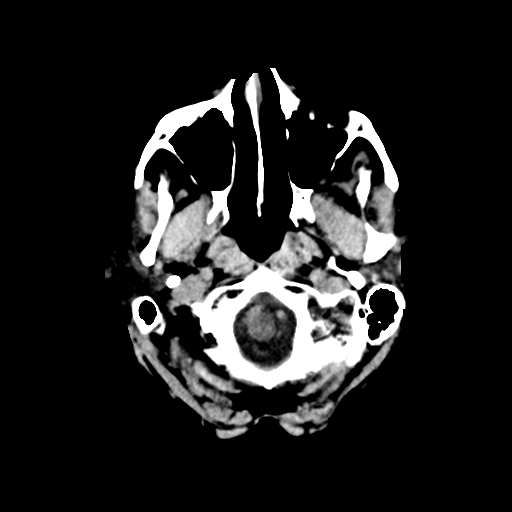
[im 3/31  bone]
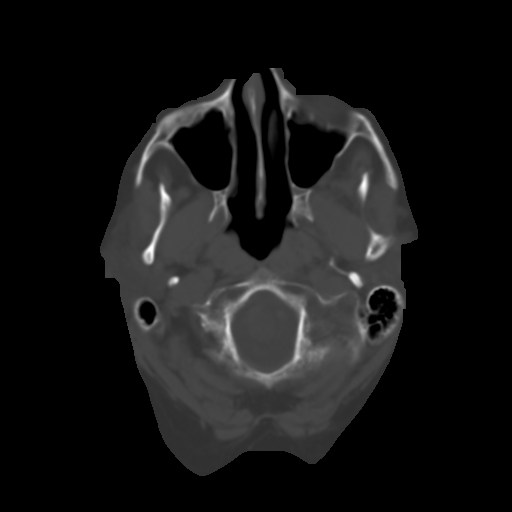
[im 5/31  brain]
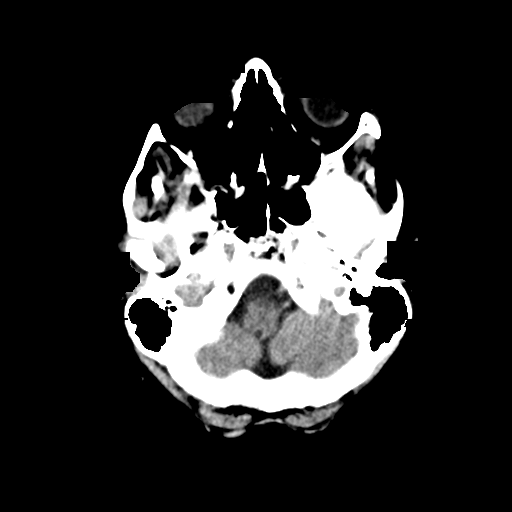
[im 7/31  brain]
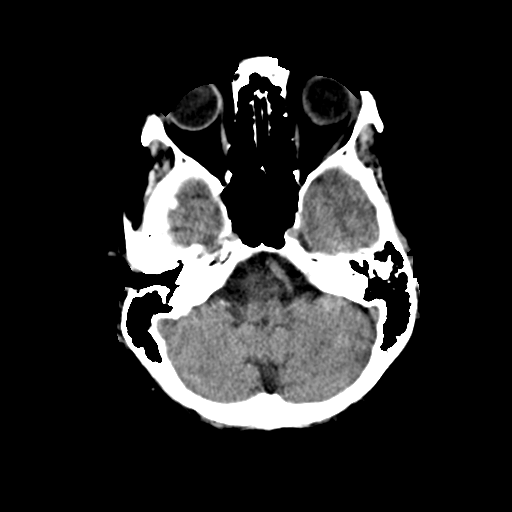
[im 9/31  brain]
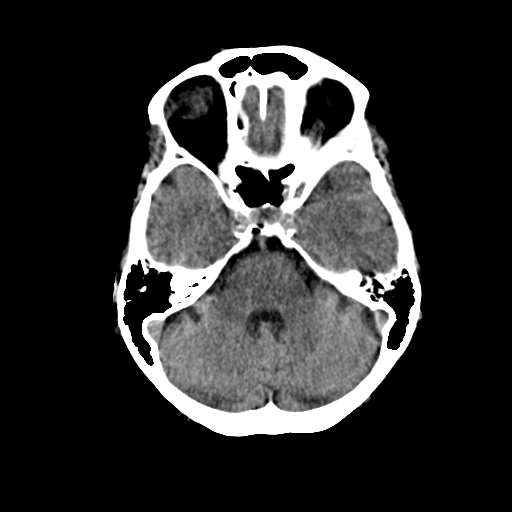
[im 11/31  brain]
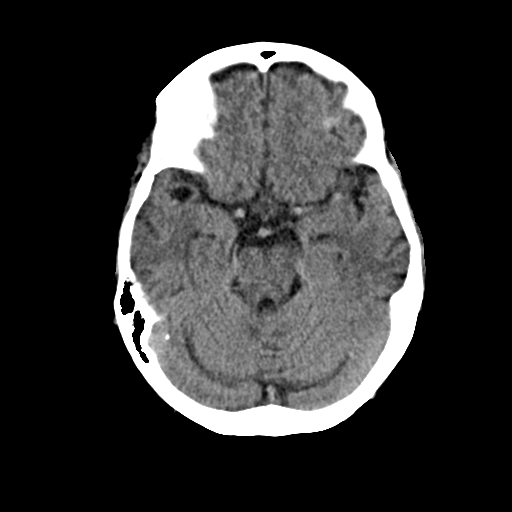
[im 11/31  bone]
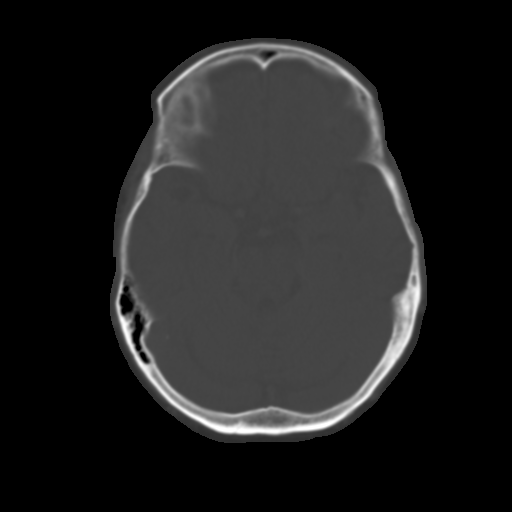
[im 13/31  brain]
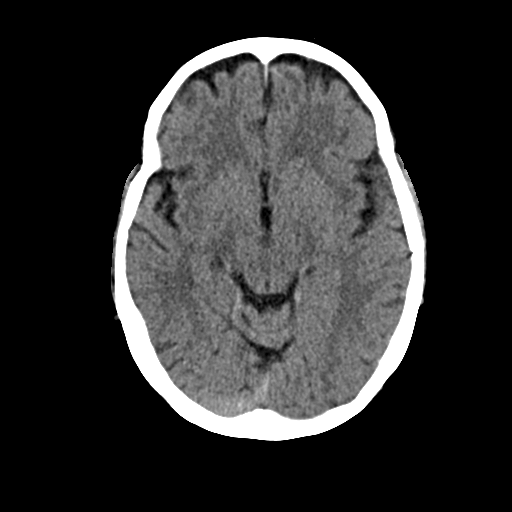
[im 16/31  brain]
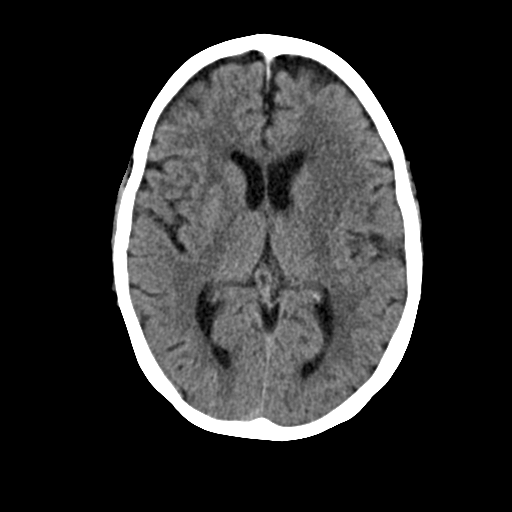
[im 18/31  brain]
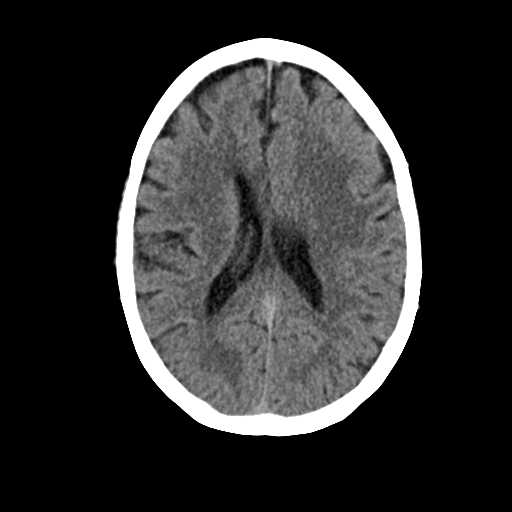
[im 20/31  brain]
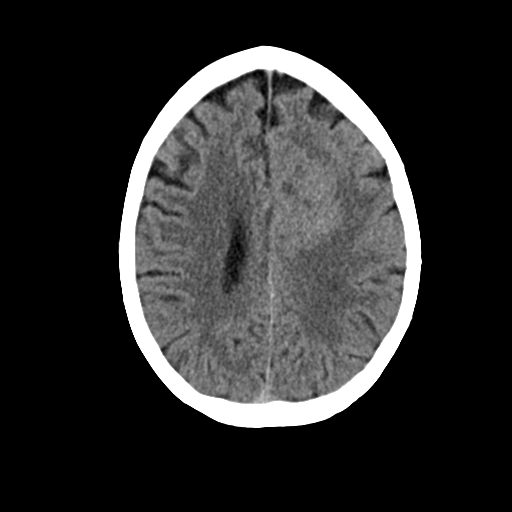
[im 20/31  bone]
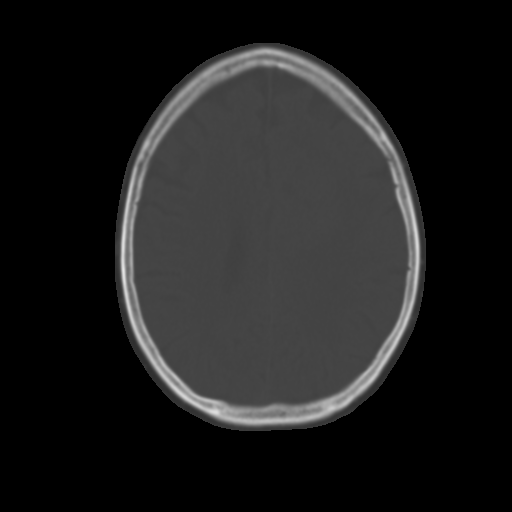
[im 22/31  brain]
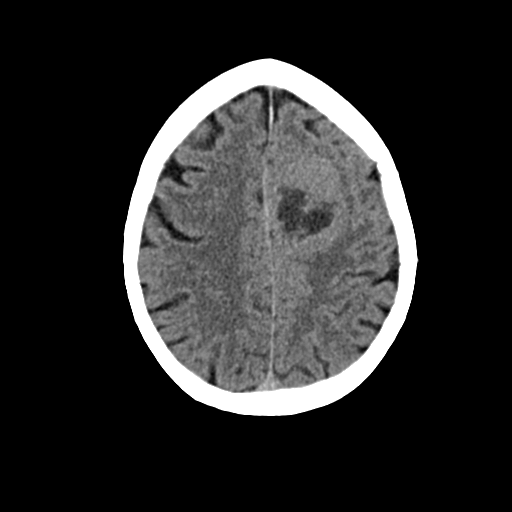
[im 24/31  brain]
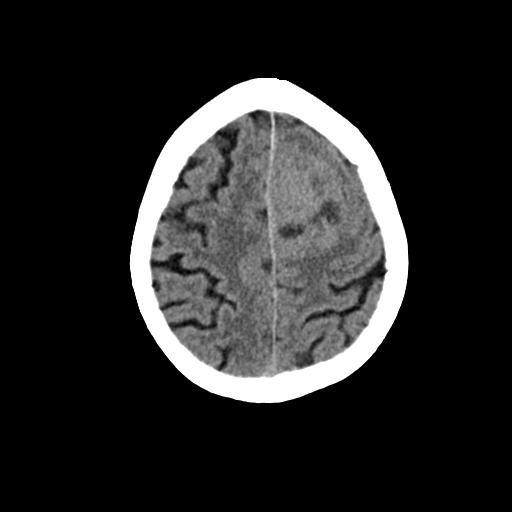
[im 26/31  brain]
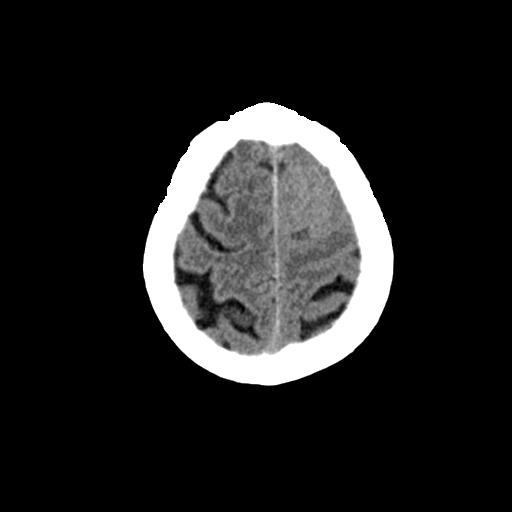
[im 28/31  brain]
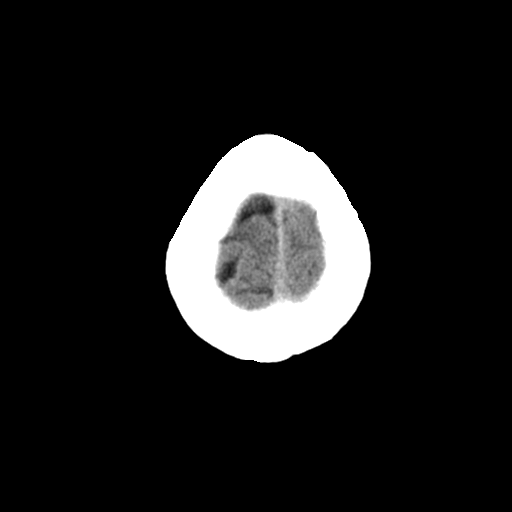
[im 28/31  bone]
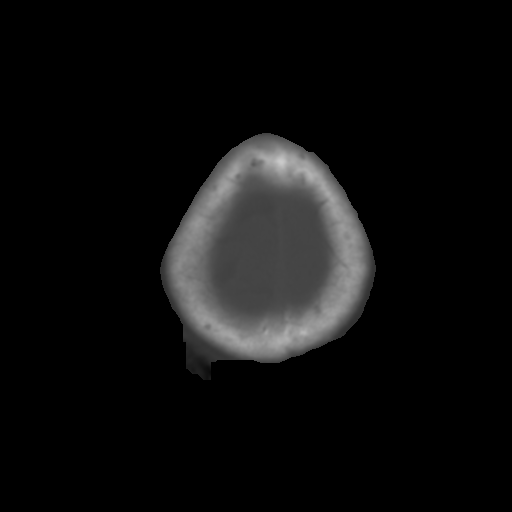

[13 of 30 positions shown; findings below may reference images not displayed]

FINDINGS: Brain: Heterogeneously enhancing mass is present in the parasagittal
left frontal region measuring approximately 4.7 x 3.4 x 4.6 cm.
There are areas central necrosis or cystic change. There is broad
contact with the falx and this is favored to be extra-axial with
mass effect on the superior frontal gyrus. There is no substantial
adjacent edema. There is partial effacement of the ventral body of
the left lateral ventricle.

There is no acute intracranial hemorrhage. Gray-white
differentiation is preserved. There is no extra-axial fluid
collection. Ventricles and sulci are within normal limits in size
and configuration.

Vascular: There is atherosclerotic calcification at the skull base.

Skull: Calvarium is unremarkable.

Sinuses/Orbits: No acute finding.

Other: None.
IMPRESSION: Large left parasagittal frontal mass with mild regional mass effect
but no significant edema. This is most likely extra-axial and
therefore most consistent with a meningioma.

ADDENDUM:
These results will be called to the ordering clinician or
representative by the Radiologist Assistant, and communication
documented in the PACS or [REDACTED].

*** End of Addendum ***
FINDINGS: Brain: Heterogeneously enhancing mass is present in the parasagittal
left frontal region measuring approximately 4.7 x 3.4 x 4.6 cm.
There are areas central necrosis or cystic change. There is broad
contact with the falx and this is favored to be extra-axial with
mass effect on the superior frontal gyrus. There is no substantial
adjacent edema. There is partial effacement of the ventral body of
the left lateral ventricle.

There is no acute intracranial hemorrhage. Gray-white
differentiation is preserved. There is no extra-axial fluid
collection. Ventricles and sulci are within normal limits in size
and configuration.

Vascular: There is atherosclerotic calcification at the skull base.

Skull: Calvarium is unremarkable.

Sinuses/Orbits: No acute finding.

Other: None.
IMPRESSION: Large left parasagittal frontal mass with mild regional mass effect
but no significant edema. This is most likely extra-axial and
therefore most consistent with a meningioma.

## 2021-07-03 ENCOUNTER — Other Ambulatory Visit: Payer: Self-pay

## 2021-07-03 DIAGNOSIS — M7989 Other specified soft tissue disorders: Secondary | ICD-10-CM

## 2021-07-03 NOTE — Addendum Note (Signed)
Addended byDoylene Bode on: 07/03/2021 10:13 PM   Modules accepted: Orders

## 2021-07-08 ENCOUNTER — Other Ambulatory Visit: Payer: Self-pay

## 2021-07-08 ENCOUNTER — Ambulatory Visit (INDEPENDENT_AMBULATORY_CARE_PROVIDER_SITE_OTHER): Payer: Medicare Other | Admitting: Vascular Surgery

## 2021-07-08 ENCOUNTER — Encounter: Payer: Self-pay | Admitting: Vascular Surgery

## 2021-07-08 ENCOUNTER — Ambulatory Visit (HOSPITAL_COMMUNITY)
Admission: RE | Admit: 2021-07-08 | Discharge: 2021-07-08 | Disposition: A | Discharge status: Home / Self Care | Payer: Medicare Other | Source: Ambulatory Visit | Attending: Vascular Surgery | Admitting: Vascular Surgery

## 2021-07-08 VITALS — BP 129/82 | HR 96 | Temp 98.2°F | Resp 20 | Ht 61.0 in | Wt 205.0 lb

## 2021-07-08 DIAGNOSIS — M7989 Other specified soft tissue disorders: Secondary | ICD-10-CM | POA: Diagnosis present

## 2021-07-08 DIAGNOSIS — I82413 Acute embolism and thrombosis of femoral vein, bilateral: Secondary | ICD-10-CM

## 2021-07-08 NOTE — Progress Notes (Signed)
Patient ID: Dana Lang, female   DOB: 01/26/1956, 65 y.o.   MRN: [id number]  Reason for Consult: New Patient (Initial Visit)   Referred by Dana Renshaw, MD  Subjective:     HPI:  Dana Lang is a 65 y.o. female currently in a nursing facility.  She was diagnosed with bilateral lower extremity DVT on July 8 that extended into the common femoral veins bilaterally.  She also had a study in August which demonstrated diminished flow in the bilateral common femoral veins.  She does not have any previous DVTs before this 1.  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  She is not aware of any previous IVC filter placement.  There is no family history of DVT per the patient and her brother who was present at the time.  She does have bilateral leg swelling right greater than left that they think has been somewhat present since the surgery but has been worse in the past couple months.  She does not have any tissue loss or ulceration.  They are unaware of any previous vascular interventions.  Past Medical History:  Diagnosis Date   Anemia    Arthritis    DVT (deep venous thrombosis) (HCC)    High cholesterol    History of bleeding ulcers 1995   did not require blood transfusion. Dr Henrene Pastor could not find in patient records.   History of blood transfusion    Hyperlipidemia    Hypertension    Osteopenia    Osteopenia    Panic attacks    PTSD (post-traumatic stress disorder)    Schizophrenia ([place])    per daughter   Seasonal allergies    Family History  Problem Relation Age of Onset   Esophageal cancer Mother    Heart attack Father 36   Past Surgical History:  Procedure Laterality Date   ABDOMINAL HYSTERECTOMY     APPENDECTOMY     APPLICATION OF CRANIAL NAVIGATION N/A 01/28/2021   Procedure: APPLICATION OF CRANIAL NAVIGATION;  Surgeon: Ashok Pall, MD;  Location: [place];  Service: Neurosurgery;   Laterality: N/A;   CRANIOTOMY Left 01/28/2021   Procedure: 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;  Surgeon: Ashok Pall, MD;  Location: [place];  Service: Neurosurgery;  Laterality: Left;   Dog bite  1970's   cosmetic surgery   TUBAL LIGATION      Short Social History:  Social History   Tobacco Use   Smoking status: Former    Packs/day: 0.25    Years: 40.00    Pack years: 10.00    Types: Cigarettes    Quit date: 12/2018    Years since quitting: 2.5   Smokeless tobacco: Never  Substance Use Topics   Alcohol use: No    Allergies  Allergen Reactions   Codeine Nausea And Vomiting    Current Outpatient Medications  Medication Sig Dispense Refill   alendronate (FOSAMAX) 70 MG tablet Take 70 mg by mouth once a week.     Ascorbic Acid (VITAMIN C PO) Take 1,000 mg by mouth daily.      aspirin 81 MG tablet Take 81 mg by mouth daily.     b complex vitamins capsule Take 1 capsule by mouth daily.     Calcium Carb-Cholecalciferol (CALCIUM 1000 + D PO) Take 1,000 mg by mouth daily.     cetirizine (ZYRTEC) 10 MG tablet Take 10  mg by mouth daily as needed for allergies.     Cholecalciferol (VITAMIN D) 50 MCG (2000 UT) tablet Take 2,000 Units by mouth daily.     enoxaparin (LOVENOX) 40 MG/0.4ML injection Inject into the skin.     ferrous sulfate 325 (65 FE) MG tablet Take 325 mg by mouth every other day.     hydrochlorothiazide (HYDRODIURIL) 25 MG tablet Take 25 mg by mouth daily.     Multiple Vitamin (MULTIVITAMIN) capsule Take 1 capsule by mouth daily.     PRESCRIPTION MEDICATION Inject into the skin every 30 (thirty) days. Hydrocortisone injection     rosuvastatin (CRESTOR) 10 MG tablet Take 10 mg by mouth at bedtime.     levETIRAcetam (KEPPRA) 500 MG tablet Take 1 tablet (500 mg total) by mouth every 12 (twelve) hours for 14 days. 28 tablet 0   No current facility-administered medications for this visit.    Review of Systems  Constitutional:   Constitutional negative. HENT: HENT negative.  Eyes: Eyes negative.  Cardiovascular: Positive for leg swelling.  GI: Gastrointestinal negative.  Musculoskeletal: Musculoskeletal negative.  Skin: Skin negative.  Neurological: Neurological negative. Hematologic: Hematologic/lymphatic negative.  Psychiatric: Psychiatric negative.       Objective:  Objective   Vitals:   07/08/21 1429  BP: 129/82  Pulse: 96  Resp: 20  Temp: 98.2 F (36.8 C)  SpO2: 96%     Physical Exam Constitutional:      Appearance: She is obese.     Comments: She is in a wheelchair  HENT:     Head: Normocephalic.     Nose:     Comments: Wearing a mask Cardiovascular:     Pulses: Normal pulses.  Pulmonary:     Effort: Pulmonary effort is normal.  Abdominal:     General: Abdomen is flat.     Palpations: Abdomen is soft.  Musculoskeletal:     Cervical back: Normal range of motion.     Right lower leg: Edema present.     Left lower leg: Edema present.     Comments: Swelling in the bilateral lower extremities right greater than left  Skin:    General: Skin is warm.  Neurological:     Mental Status: She is alert.     Comments: She does have slurring of her speech but she can clearly be understood  Psychiatric:        Mood and Affect: Mood normal.    Data: RIGHTCompressibilityPhasicitySpontaneityProperties       Thrombus  Aging    +-----+---------------+---------+-----------+-----------------+------------  ----+  POP  Partial        Yes      Yes                         Age                                                                          Indeterminate     +-----+---------------+---------+-----------+-----------------+------------  ----+  PTV  Full                                                                    +-----+---------------+---------+-----------+-----------------+------------  ----+  PERO                                    not well                                                                      visualized                           +-----+---------------+---------+-----------+-----------------+------------  ----+  GSV  Full                                                                    +-----+---------------+---------+-----------+-----------------+------------  ----+  SSV  Partial                                             Age                                                                          Indeterminate     +-----+---------------+---------+-----------+-----------------+------------  ----+   Patient was not imaged above popliteal vein, scanned in wheelchair and  unable to stand/use lift       +----+---------------+---------+-----------+------------------+------------  ----+  LEFTCompressibilityPhasicitySpontaneityProperties        Thrombus  Aging    +----+---------------+---------+-----------+------------------+------------  ----+  POP Partial        Yes      Yes                          Age                                                                           Indeterminate     +----+---------------+---------+-----------+------------------+------------  ----+  PTV Full                                                                     +----+---------------+---------+-----------+------------------+------------  ----+  PERO  not well                                                                     visualized                            +----+---------------+---------+-----------+------------------+------------  ----+  GSV Full                                                                     +----+---------------+---------+-----------+------------------+------------  ----+  SSV Full                                                                      +----+---------------+---------+-----------+------------------+------------  ----+   Patient was not imaged above popliteal vein, scanned in wheelchair and  unable to stand/use lift     Summary:  RIGHT:  - Findings consistent with age indeterminate deep vein thrombosis  involving the right popliteal vein.  - Findings consistent with age indeterminate superficial vein thrombosis  involving the right small saphenous vein.  - Portions of this examination were limited- see technologist comments  above.     LEFT:  - Findings consistent with age indeterminate deep vein thrombosis  involving the left popliteal vein.  - Portions of this examination were limited- see technologist comments  above.         Assessment/Plan:     64 year old female with history of extensive bilateral lower extremity DVT.  She is on blood thinners Lovenox at this time.  Blood clots were 2 months ago.  Without previous history of DVT she would require 3 months total of anticoagulation I think given her status in a wheelchair with extensive DVT she should be considered for 6 to 12 months of treatment this is also posing that she does not have a filter in place which I have no record to demonstrate.  I have recommended gentle 15 to 20 mmHg compression bilaterally as well as water exercise if possible.  She can follow-up with me on an as-needed basis    Waynetta Sandy MD [facility]

## 2021-09-13 ENCOUNTER — Other Ambulatory Visit: Payer: Self-pay

## 2021-09-13 ENCOUNTER — Emergency Department (HOSPITAL_COMMUNITY): Payer: Medicare Other

## 2021-09-13 ENCOUNTER — Encounter (HOSPITAL_COMMUNITY): Payer: Self-pay | Admitting: Internal Medicine

## 2021-09-13 ENCOUNTER — Observation Stay (HOSPITAL_COMMUNITY)
Admission: EM | Admit: 2021-09-13 | Discharge: 2021-09-15 | Disposition: A | Discharge status: Skilled Nursing Facility | Payer: Medicare Other | Attending: Internal Medicine | Admitting: Internal Medicine

## 2021-09-13 DIAGNOSIS — R079 Chest pain, unspecified: Secondary | ICD-10-CM

## 2021-09-13 DIAGNOSIS — Z20822 Contact with and (suspected) exposure to covid-19: Secondary | ICD-10-CM | POA: Insufficient documentation

## 2021-09-13 DIAGNOSIS — R0789 Other chest pain: Principal | ICD-10-CM | POA: Insufficient documentation

## 2021-09-13 DIAGNOSIS — R072 Precordial pain: Secondary | ICD-10-CM | POA: Diagnosis not present

## 2021-09-13 DIAGNOSIS — K5900 Constipation, unspecified: Secondary | ICD-10-CM | POA: Insufficient documentation

## 2021-09-13 DIAGNOSIS — E785 Hyperlipidemia, unspecified: Secondary | ICD-10-CM | POA: Diagnosis present

## 2021-09-13 DIAGNOSIS — D72829 Elevated white blood cell count, unspecified: Secondary | ICD-10-CM | POA: Diagnosis not present

## 2021-09-13 DIAGNOSIS — G5600 Carpal tunnel syndrome, unspecified upper limb: Secondary | ICD-10-CM | POA: Insufficient documentation

## 2021-09-13 DIAGNOSIS — M179 Osteoarthritis of knee, unspecified: Secondary | ICD-10-CM | POA: Insufficient documentation

## 2021-09-13 DIAGNOSIS — F411 Generalized anxiety disorder: Secondary | ICD-10-CM | POA: Insufficient documentation

## 2021-09-13 DIAGNOSIS — Z7982 Long term (current) use of aspirin: Secondary | ICD-10-CM | POA: Insufficient documentation

## 2021-09-13 DIAGNOSIS — I10 Essential (primary) hypertension: Secondary | ICD-10-CM | POA: Diagnosis not present

## 2021-09-13 DIAGNOSIS — Z79899 Other long term (current) drug therapy: Secondary | ICD-10-CM | POA: Insufficient documentation

## 2021-09-13 DIAGNOSIS — Z87891 Personal history of nicotine dependence: Secondary | ICD-10-CM | POA: Insufficient documentation

## 2021-09-13 DIAGNOSIS — K259 Gastric ulcer, unspecified as acute or chronic, without hemorrhage or perforation: Secondary | ICD-10-CM | POA: Insufficient documentation

## 2021-09-13 DIAGNOSIS — M6281 Muscle weakness (generalized): Secondary | ICD-10-CM | POA: Diagnosis not present

## 2021-09-13 DIAGNOSIS — K8020 Calculus of gallbladder without cholecystitis without obstruction: Secondary | ICD-10-CM | POA: Insufficient documentation

## 2021-09-13 DIAGNOSIS — F32A Depression, unspecified: Secondary | ICD-10-CM | POA: Insufficient documentation

## 2021-09-13 LAB — RESP PANEL BY RT-PCR (FLU A&B, COVID) ARPGX2
Influenza A by PCR: NEGATIVE
Influenza B by PCR: NEGATIVE
SARS Coronavirus 2 by RT PCR: NEGATIVE

## 2021-09-13 LAB — CBC WITH DIFFERENTIAL/PLATELET
Abs Immature Granulocytes: 0.05 10*3/uL (ref 0.00–0.07)
Basophils Absolute: 0 10*3/uL (ref 0.0–0.1)
Basophils Relative: 0 %
Eosinophils Absolute: 0 10*3/uL (ref 0.0–0.5)
Eosinophils Relative: 0 %
HCT: 43.8 % (ref 36.0–46.0)
Hemoglobin: 14.2 g/dL (ref 12.0–15.0)
Immature Granulocytes: 0 %
Lymphocytes Relative: 10 %
Lymphs Abs: 1.1 10*3/uL (ref 0.7–4.0)
MCH: 28 pg (ref 26.0–34.0)
MCHC: 32.4 g/dL (ref 30.0–36.0)
MCV: 86.2 fL (ref 80.0–100.0)
Monocytes Absolute: 0.4 10*3/uL (ref 0.1–1.0)
Monocytes Relative: 3 %
Neutro Abs: 9.5 10*3/uL — ABNORMAL HIGH (ref 1.7–7.7)
Neutrophils Relative %: 87 %
Platelets: 263 10*3/uL (ref 150–400)
RBC: 5.08 MIL/uL (ref 3.87–5.11)
RDW: 14.2 % (ref 11.5–15.5)
WBC: 11.1 10*3/uL — ABNORMAL HIGH (ref 4.0–10.5)
nRBC: 0 % (ref 0.0–0.2)

## 2021-09-13 LAB — COMPREHENSIVE METABOLIC PANEL
ALT: 16 U/L (ref 0–44)
AST: 20 U/L (ref 15–41)
Albumin: 3.9 g/dL (ref 3.5–5.0)
Alkaline Phosphatase: 58 U/L (ref 38–126)
Anion gap: 8 (ref 5–15)
BUN: 13 mg/dL (ref 8–23)
CO2: 28 mmol/L (ref 22–32)
Calcium: 10 mg/dL (ref 8.9–10.3)
Chloride: 103 mmol/L (ref 98–111)
Creatinine, Ser: 0.36 mg/dL — ABNORMAL LOW (ref 0.44–1.00)
GFR, Estimated: >60 mL/min (ref >60)
Glucose, Bld: 128 mg/dL — ABNORMAL HIGH (ref 70–99)
Potassium: 3.5 mmol/L (ref 3.5–5.1)
Sodium: 139 mmol/L (ref 135–145)
Total Bilirubin: 0.6 mg/dL (ref 0.3–1.2)
Total Protein: 6.9 g/dL (ref 6.5–8.1)

## 2021-09-13 LAB — MAGNESIUM: Magnesium: 2.1 mg/dL (ref 1.7–2.4)

## 2021-09-13 LAB — TROPONIN I (HIGH SENSITIVITY)
Troponin I (High Sensitivity): 7 ng/L (ref <18)
Troponin I (High Sensitivity): 15 ng/L (ref <18)

## 2021-09-13 LAB — MRSA NEXT GEN BY PCR, NASAL: MRSA by PCR Next Gen: NOT DETECTED

## 2021-09-13 MED ORDER — POTASSIUM CHLORIDE CRYS ER 20 MEQ PO TBCR
40.0000 meq | EXTENDED_RELEASE_TABLET | Freq: Once | ORAL | Status: AC
  Administered 2021-09-13: 40 meq via ORAL
  Filled 2021-09-13: qty 2

## 2021-09-13 MED ORDER — ONDANSETRON HCL 4 MG PO TABS
4.0000 mg | ORAL_TABLET | Freq: Four times a day (QID) | ORAL | Status: DC | PRN
  Administered 2021-09-13 – 2021-09-14 (×2): 4 mg via ORAL
  Filled 2021-09-13 (×2): qty 1

## 2021-09-13 MED ORDER — ENOXAPARIN SODIUM 40 MG/0.4ML IJ SOSY
40.0000 mg | PREFILLED_SYRINGE | INTRAMUSCULAR | Status: DC
  Administered 2021-09-13: 40 mg via SUBCUTANEOUS
  Filled 2021-09-13: qty 0.4

## 2021-09-13 MED ORDER — ONDANSETRON HCL 4 MG/2ML IJ SOLN: 4.0000 mg | Freq: Four times a day (QID) | INTRAMUSCULAR | Status: DC | PRN

## 2021-09-13 MED ORDER — ACETAMINOPHEN 650 MG RE SUPP: 650.0000 mg | Freq: Four times a day (QID) | RECTAL | Status: DC | PRN

## 2021-09-13 MED ORDER — ASPIRIN 81 MG PO CHEW
81.0000 mg | CHEWABLE_TABLET | Freq: Every day | ORAL | Status: DC
  Administered 2021-09-13 – 2021-09-15 (×3): 81 mg via ORAL
  Filled 2021-09-13 (×3): qty 1

## 2021-09-13 MED ORDER — SIMETHICONE 80 MG PO CHEW
80.0000 mg | CHEWABLE_TABLET | Freq: Once | ORAL | Status: AC
  Administered 2021-09-13: 80 mg via ORAL
  Filled 2021-09-13: qty 1

## 2021-09-13 MED ORDER — ACETAMINOPHEN 325 MG PO TABS
650.0000 mg | ORAL_TABLET | Freq: Four times a day (QID) | ORAL | Status: DC | PRN
  Administered 2021-09-14 – 2021-09-15 (×3): 650 mg via ORAL
  Filled 2021-09-13 (×3): qty 2

## 2021-09-13 MED ORDER — FERROUS SULFATE 325 (65 FE) MG PO TABS
325.0000 mg | ORAL_TABLET | ORAL | Status: DC
  Administered 2021-09-14: 325 mg via ORAL
  Filled 2021-09-13: qty 1

## 2021-09-13 MED ORDER — HYDROCHLOROTHIAZIDE 25 MG PO TABS
25.0000 mg | ORAL_TABLET | Freq: Every day | ORAL | Status: DC
  Administered 2021-09-13 – 2021-09-15 (×3): 25 mg via ORAL
  Filled 2021-09-13 (×3): qty 1

## 2021-09-13 MED ORDER — ROSUVASTATIN CALCIUM 10 MG PO TABS
10.0000 mg | ORAL_TABLET | Freq: Every day | ORAL | Status: DC
  Administered 2021-09-13 – 2021-09-14 (×2): 10 mg via ORAL
  Filled 2021-09-13 (×2): qty 1

## 2021-09-13 MED ORDER — ENOXAPARIN SODIUM 40 MG/0.4ML IJ SOSY: 40.0000 mg | PREFILLED_SYRINGE | INTRAMUSCULAR | Status: DC

## 2021-09-13 NOTE — H&P (Signed)
History and Physical    Dana Lang [id number] DOB: [birth year] DOA: 09/13/2021  PCP: [facility] [place]  Patient coming from: Home.  I have personally briefly reviewed patient's old medical records in [place]  Chief Complaint: Chest pain.  HPI: Dana Lang is a 65 y.o. female with medical history significant of anemia, osteoarthritis, DVT, hyperlipidemia, GI bleed due to PUD, hyperlipidemia, hypertension, osteopenia, panic attacks, PTSD, seasonal allergies who is coming to the emergency department due to 7/10 dull, nonradiating chest pain associated with nausea and lightheadedness.  EMS noted her blood pressure is 210/120 mmHg.  They gave her multiple NTG tablets sublingually and aspirin.  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  She denied fever, chills, rhinorrhea, sore throat, wheezing or hemoptysis.  No diaphoresis, PND, orthopnea or recent pitting edema of the lower extremities.  Denied abdominal pain, emesis, diarrhea, melena or hematochezia.  She occasionally gets constipated.  No dysuria, flank pain, frequency or hematuria.  No blurry vision, polyuria, polydipsia or polyphagia.  ED Course: Initial vital signs were temperature 98.2 F, pulse 90, respiration rate 19, BP 144/85 mmHg O2 sat on 97% on room air.The patient received aspirin and multiple NTG tablets with chest pain resolution.  Lab work: CBC is her white count 11.1, hemoglobin 14.2 g/dL platelets 263.  Troponin was 7 and then 15 ng/L.  CMP showed a glucose of 128 and creatinine of 0.36 mg/dL.  The rest of chemistry was unremarkable.  EKG showed nonspecific ST abnormalities.  Imaging: A portable chest radiograph showed scatteredminimal atelectasis.  See image and radiology report for further details.  Review of Systems: As per HPI otherwise all other systems reviewed and are negative.  Past Medical History:  Diagnosis Date   Anemia    Arthritis     DVT (deep venous thrombosis) (HCC)    High cholesterol    History of bleeding ulcers 1995   did not require blood transfusion. Dr Henrene Pastor could not find in patient records.   History of blood transfusion    Hyperlipidemia    Hypertension    Osteopenia    Osteopenia    Panic attacks    PTSD (post-traumatic stress disorder)    Schizophrenia ([place])    per daughter   Seasonal allergies    Past Surgical History:  Procedure Laterality Date   ABDOMINAL HYSTERECTOMY     APPENDECTOMY     APPLICATION OF CRANIAL NAVIGATION N/A 01/28/2021   Procedure: APPLICATION OF CRANIAL NAVIGATION;  Surgeon: Ashok Pall, MD;  Location: [place];  Service: Neurosurgery;  Laterality: N/A;   CRANIOTOMY Left 01/28/2021   Procedure: 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;  Surgeon: Ashok Pall, MD;  Location: [place];  Service: Neurosurgery;  Laterality: Left;   Dog bite  1970's   cosmetic surgery   TUBAL LIGATION     Social History  reports that she quit smoking about 2 years ago. Her smoking use included cigarettes. She has a 10.00 pack-year smoking history. She has never used smokeless tobacco. She reports that she does not drink alcohol and does not use drugs.  Allergies  Allergen Reactions   Codeine Nausea And Vomiting   Family History  Problem Relation Age of Onset   Esophageal cancer Mother    Heart attack Father 53   Prior to Admission medications   Medication Sig Start Date End Date Taking? Authorizing Provider  alendronate (FOSAMAX) 70 MG tablet Take 70 mg  by mouth once a week. 12/18/20  Yes [provider]  Ascorbic Acid (VITAMIN C PO) Take 1,000 mg by mouth daily.    Yes [provider]  aspirin 81 MG tablet Take 81 mg by mouth daily.   Yes [provider]  b complex vitamins capsule Take 1 capsule by mouth daily.   Yes [provider]  Calcium Carb-Cholecalciferol (CALCIUM 1000 + D PO) Take 1,000 mg by mouth daily.   Yes  [provider]  cetirizine (ZYRTEC) 10 MG tablet Take 10 mg by mouth daily as needed for allergies.   Yes [provider]  Cholecalciferol (VITAMIN D) 50 MCG (2000 UT) tablet Take 2,000 Units by mouth daily.   Yes [provider]  enoxaparin (LOVENOX) 40 MG/0.4ML injection Inject 40 mg into the skin daily. Patient states that this medication has been on hold for a week. 06/21/21  Yes [provider]  ferrous sulfate 325 (65 FE) MG tablet Take 325 mg by mouth every other day.   Yes [provider]  hydrochlorothiazide (HYDRODIURIL) 25 MG tablet Take 25 mg by mouth daily.   Yes [provider]  Multiple Vitamin (MULTIVITAMIN) capsule Take 1 capsule by mouth daily.   Yes [provider]  PRESCRIPTION MEDICATION Inject into the skin every 30 (thirty) days. Hydrocortisone injection   Yes [provider]  rosuvastatin (CRESTOR) 10 MG tablet Take 10 mg by mouth at bedtime. 01/06/21  Yes [provider]   Physical Exam: Vitals:   09/13/21 1430 09/13/21 1500 09/13/21 1517 09/13/21 1537  BP: (!) 145/75 134/65  (!) 144/83  Pulse:  84 87 87  Resp: 18 17  20   Temp: 98.3 F (36.8 C)  98.2 F (36.8 C) 98.5 F (36.9 C)  TempSrc: Oral  Oral Oral  SpO2:  96%  98%   Constitutional: NAD, calm, comfortable Eyes: PERRL, lids and conjunctivae normal ENMT: Mucous membranes are moist. Posterior pharynx clear of any exudate or lesions. Neck: normal, supple, no masses, no thyromegaly Respiratory: clear to auscultation bilaterally, no wheezing, no crackles. Normal respiratory effort. No accessory muscle use.  Cardiovascular: Regular rate and rhythm, no murmurs / rubs / gallops. No extremity edema. 2+ pedal pulses. No carotid bruits.  Abdomen: Soft, no tenderness, no masses palpated. No hepatosplenomegaly. Bowel sounds positive.  Musculoskeletal: no clubbing / cyanosis, but there is some fingernails pitting. Good ROM, no contractures.  Normal muscle tone.  Skin: no rashes, lesions, ulcers on limited dermatological examination. Neurologic: CN 2-12 grossly intact. Sensation intact, DTR normal. Strength 5/5 in all 4.  Psychiatric: Normal judgment and insight. Alert and oriented x 3. Normal mood.    Labs on Admission: I have personally reviewed following labs and imaging studies  CBC: Recent Labs  Lab 09/13/21 0907  WBC 11.1*  NEUTROABS 9.5*  HGB 14.2  HCT 43.8  MCV 86.2  PLT 277    Basic Metabolic Panel: Recent Labs  Lab 09/13/21 0907  NA 139  K 3.5  CL 103  CO2 28  GLUCOSE 128*  BUN 13  CREATININE 0.36*  CALCIUM 10.0    GFR: CrCl cannot be calculated (Unknown ideal weight.).  Liver Function Tests: Recent Labs  Lab 09/13/21 0907  AST 20  ALT 16  ALKPHOS 58  BILITOT 0.6  PROT 6.9  ALBUMIN 3.9   Radiological Exams on Admission: DG Chest Portable 1 View  Result Date: 09/13/2021 CLINICAL DATA:  Chest pain. EXAM: PORTABLE CHEST 1 VIEW COMPARISON:  Chest radiograph  05/12/2018 FINDINGS: Stable cardiomediastinal contours. Calcifications noted in the aortic arch. Scattered minimal atelectasis, otherwise lungs are clear. No pneumothorax or large pleural effusion. No acute finding in the visualized skeleton. IMPRESSION: Scattered minimal atelectasis. No other acute finding. Electronically Signed   By: Audie Pinto M.D.   On: 09/13/2021 10:09    EKG: Independently reviewed.  Vent. rate 78 BPM PR interval 149 ms QRS duration 91 ms QT/QTcB 375/428 ms P-R-T axes 54 58 86 Sinus rhythm nonspecific ST changes  Assessment/Plan Principal Problem:   Chest pain Observation/telemetry. Continue aspirin 81 mg p.o. daily.   Continue rosuvastatin 10 mg p.o. daily. N.p.o. after midnight. Obtain echocardiogram in a.m. Cardiology will evaluate in the morning.  Active Problems:   Hyperlipidemia Continue Crestor 10 mg p.o. bedtime.    Essential hypertension Continue HCTZ 25 mg p.o. daily.     Leukocytosis Likely stress-induced. Follow-up WBC in AM.   DVT prophylaxis: Lovenox SQ. Code Status:   Full code. Family Communication:   Disposition Plan:   Patient is from:  Home.  Anticipated DC to:  Home.  Anticipated DC date:  09/14/2021.  Anticipated DC barriers: Clinical status/pending consult and echo.  Consults called:  Cardiology will see in the morning. Admission status:  Observation/telemetry.    Severity of Illness:The patient will remain in observation f with close cardio monitoring.  We will obtain an echocardiogram.  Reubin Milan MD [facility]  How to contact the [facility] Attending or Consulting provider [place] or covering provider during after hours [place], for this patient?   Check the care team in [facility] and look for a) attending/consulting [facility] provider listed and b) the [facility] team listed Log into [email or web address] and use [place]'s universal password to access. If you do not have the password, please contact the hospital operator. Locate the [facility] provider you are looking for under [facility] and page to a number that you can be directly reached. If you still have difficulty reaching the provider, please page the [facility] (Director on Call) for the Hospitalists listed on amion for assistance.  09/13/2021, 3:40 PM   This document was prepared in Dragon voice recognition software and may contain some unintended transcription errors.

## 2021-09-13 NOTE — ED Triage Notes (Signed)
Patient BIBA. EMS called d/t chest px, patient endorsed nausea and feeling hot and flushed. On arrival chest px was 7/10 pain in central cheat w/no radiation. Given 324 aspirin by EMS with 20 L AC PIV placed. 12 lead unremarkable in ambulance.  HX of HTN, and stroke w/R side deficits.   Arrival BP was 220/120 HR: 84 RR: 18 SPO2: 97 on RA CBG: 137

## 2021-09-13 NOTE — ED Provider Notes (Signed)
[facility] DEPT Provider Note   CSN: [id number] Arrival date & time: 09/13/21  7124     History Chief Complaint  Patient presents with   Chest Pain    Dana Lang is a 65 y.o. female.  HPI 65 year old female presents with chest pain.  This morning she was about to eat breakfast and all of a sudden felt hot and lightheaded and some nausea.  She had some chest pain that felt like a dull 7 out of 10.  EMS noted her to have significantly elevated blood pressure of about 210/120.  She was given multiple nitroglycerin and aspirin.  Her symptoms have now completely resolved.  She denies ever having a headache.  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 She had some nausea but no abdominal pain. No back pain.  Past Medical History:  Diagnosis Date   Anemia    Arthritis    DVT (deep venous thrombosis) (HCC)    High cholesterol    History of bleeding ulcers 1995   did not require blood transfusion. Dr Henrene Pastor could not find in patient records.   History of blood transfusion    Hyperlipidemia    Hypertension    Osteopenia    Osteopenia    Panic attacks    PTSD (post-traumatic stress disorder)    Schizophrenia ([place])    per daughter   Seasonal allergies     Patient Active Problem List   Diagnosis Date Noted   Hyperlipidemia 09/13/2021   Osteoarthritis of knee 09/13/2021   Gastric ulcer 09/13/2021   Depression 09/13/2021   Cholelithiasis 09/13/2021   Constipation 09/13/2021   Carpal tunnel syndrome 09/13/2021   Anxiety state 09/13/2021   Pressure injury of skin 01/29/2021   S/P craniotomy 01/28/2021   Meningioma ([place]) 01/28/2021   Essential hypertension 10/30/2020   Chest pain 07/23/2015   Right arm pain 07/23/2015   Costochondritis, acute 07/23/2015   Pre-syncope 07/23/2015   Tobacco abuse 07/23/2015   Atypical chest pain 07/23/2015   Pain     Past Surgical History:  Procedure  Laterality Date   ABDOMINAL HYSTERECTOMY     APPENDECTOMY     APPLICATION OF CRANIAL NAVIGATION N/A 01/28/2021   Procedure: APPLICATION OF CRANIAL NAVIGATION;  Surgeon: Ashok Pall, MD;  Location: [place];  Service: Neurosurgery;  Laterality: N/A;   CRANIOTOMY Left 01/28/2021   Procedure: 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;  Surgeon: Ashok Pall, MD;  Location: [facility];  Service: Neurosurgery;  Laterality: Left;   Dog bite  1970's   cosmetic surgery   TUBAL LIGATION       OB History   No obstetric history on file.     Family History  Problem Relation Age of Onset   Esophageal cancer Mother    Heart attack Father 53    Social History   Tobacco Use   Smoking status: Former    Packs/day: 0.25    Years: 40.00    Pack years: 10.00    Types: Cigarettes    Quit date: 12/2018    Years since quitting: 2.7   Smokeless tobacco: Never  Vaping Use   Vaping Use: Never used  Substance Use Topics   Alcohol use: No   Drug use: No    Home Medications Prior to Admission medications   Medication Sig Start Date End Date Taking? Authorizing Provider  alendronate (FOSAMAX) 70 MG tablet Take 70 mg by  mouth once a week. 12/18/20  Yes [provider]  Ascorbic Acid (VITAMIN C PO) Take 1,000 mg by mouth daily.    Yes [provider]  aspirin 81 MG tablet Take 81 mg by mouth daily.   Yes [provider]  b complex vitamins capsule Take 1 capsule by mouth daily.   Yes [provider]  Calcium Carb-Cholecalciferol (CALCIUM 1000 + D PO) Take 1,000 mg by mouth daily.   Yes [provider]  cetirizine (ZYRTEC) 10 MG tablet Take 10 mg by mouth daily as needed for allergies.   Yes [provider]  Cholecalciferol (VITAMIN D) 50 MCG (2000 UT) tablet Take 2,000 Units by mouth daily.   Yes [provider]  enoxaparin (LOVENOX) 40 MG/0.4ML injection Inject 40 mg into the skin daily. Patient states that this  medication has been on hold for a week. 06/21/21  Yes [provider]  ferrous sulfate 325 (65 FE) MG tablet Take 325 mg by mouth every other day.   Yes [provider]  hydrochlorothiazide (HYDRODIURIL) 25 MG tablet Take 25 mg by mouth daily.   Yes [provider]  Multiple Vitamin (MULTIVITAMIN) capsule Take 1 capsule by mouth daily.   Yes [provider]  PRESCRIPTION MEDICATION Inject into the skin every 30 (thirty) days. Hydrocortisone injection   Yes [provider]  rosuvastatin (CRESTOR) 10 MG tablet Take 10 mg by mouth at bedtime. 01/06/21  Yes [provider]    Allergies    Codeine  Review of Systems   Review of Systems  Constitutional:  Negative for fever.  Respiratory:  Negative for shortness of breath.   Cardiovascular:  Positive for chest pain. Negative for leg swelling.  Gastrointestinal:  Positive for nausea.  Neurological:  Positive for light-headedness. Negative for dizziness and headaches.  All other systems reviewed and are negative.  Physical Exam Updated Vital Signs BP 134/65   Pulse 87   Temp 98.2 F (36.8 C) (Oral)   Resp 17   SpO2 96%   Physical Exam Vitals and nursing note reviewed.  Constitutional:      General: She is not in acute distress.    Appearance: She is well-developed. She is not ill-appearing or diaphoretic.  HENT:     Head: Normocephalic and atraumatic.     Right Ear: External ear normal.     Left Ear: External ear normal.     Nose: Nose normal.  Eyes:     General:        Right eye: No discharge.        Left eye: No discharge.  Cardiovascular:     Rate and Rhythm: Normal rate and regular rhythm.     Heart sounds: Normal heart sounds.  Pulmonary:     Effort: Pulmonary effort is normal.     Breath sounds: Normal breath sounds.  Abdominal:     Palpations: Abdomen is soft.     Tenderness: There is no abdominal tenderness.  Musculoskeletal:     Right lower leg: No edema.      Left lower leg: No edema.  Skin:    General: Skin is warm and dry.  Neurological:     Mental Status: She is alert.  Psychiatric:        Mood and Affect: Mood is not anxious.    ED Results / Procedures / Treatments   Labs (all labs ordered are listed, but only abnormal results are displayed) Labs Reviewed  COMPREHENSIVE METABOLIC PANEL -  Abnormal; Notable for the following components:      Result Value   Glucose, Bld 128 (*)    Creatinine, Ser 0.36 (*)    All other components within normal limits  CBC WITH DIFFERENTIAL/PLATELET - Abnormal; Notable for the following components:   WBC 11.1 (*)    Neutro Abs 9.5 (*)    All other components within normal limits  RESP PANEL BY RT-PCR (FLU A&B, COVID) [id number]  TROPONIN I (HIGH SENSITIVITY)  TROPONIN I (HIGH SENSITIVITY)    EKG EKG Interpretation  Date/Time:  Sunday September 13 2021 08:44:17 EST Ventricular Rate:  78 PR Interval:  149 QRS Duration: 91 QT Interval:  375 QTC Calculation: 428 R Axis:   58 Text Interpretation: Sinus rhythm nonspecific ST changes similar to April 2022 Confirmed by Sherwood Gambler [phone]) on 09/13/2021 8:58:04 AM  Radiology DG Chest Portable 1 View  Result Date: 09/13/2021 CLINICAL DATA:  Chest pain. EXAM: PORTABLE CHEST 1 VIEW COMPARISON:  Chest radiograph 05/12/2018 FINDINGS: Stable cardiomediastinal contours. Calcifications noted in the aortic arch. Scattered minimal atelectasis, otherwise lungs are clear. No pneumothorax or large pleural effusion. No acute finding in the visualized skeleton. IMPRESSION: Scattered minimal atelectasis. No other acute finding. Electronically Signed   By: Audie Pinto M.D.   On: 09/13/2021 10:09    Procedures Procedures   Medications Ordered in ED Medications  enoxaparin (LOVENOX) injection 40 mg (has no administration in time range)  acetaminophen (TYLENOL) tablet 650 mg (has no administration in time range)    Or  acetaminophen (TYLENOL) suppository 650  mg (has no administration in time range)  ondansetron (ZOFRAN) tablet 4 mg (has no administration in time range)    Or  ondansetron (ZOFRAN) injection 4 mg (has no administration in time range)    ED Course  I have reviewed the triage vital signs and the nursing notes.  Pertinent labs & imaging results that were available during my care of the patient were reviewed by me and considered in my medical decision making (see chart for details).    MDM Rules/Calculators/A&P                           Patient's chest pain continues to be resolved.  However her troponin did go up by 8 points despite his still being technically negative.  She is a higher risk patient with a heart score of 6.  Thus I think she should be admitted for further evaluation and observation.  Dr. Olevia Bowens to admit.  Doubt PE, dissection. Final Clinical Impression(s) / ED Diagnoses Final diagnoses:  Nonspecific chest pain    Rx / DC Orders ED Discharge Orders     None        Sherwood Gambler, MD 09/13/21 [phone]

## 2021-09-14 ENCOUNTER — Observation Stay (HOSPITAL_BASED_OUTPATIENT_CLINIC_OR_DEPARTMENT_OTHER): Payer: Medicare Other

## 2021-09-14 ENCOUNTER — Observation Stay (HOSPITAL_COMMUNITY): Payer: Medicare Other

## 2021-09-14 DIAGNOSIS — I10 Essential (primary) hypertension: Secondary | ICD-10-CM | POA: Diagnosis not present

## 2021-09-14 DIAGNOSIS — R079 Chest pain, unspecified: Secondary | ICD-10-CM | POA: Diagnosis not present

## 2021-09-14 DIAGNOSIS — D72829 Elevated white blood cell count, unspecified: Secondary | ICD-10-CM

## 2021-09-14 DIAGNOSIS — R072 Precordial pain: Secondary | ICD-10-CM | POA: Diagnosis not present

## 2021-09-14 DIAGNOSIS — E785 Hyperlipidemia, unspecified: Secondary | ICD-10-CM | POA: Diagnosis not present

## 2021-09-14 LAB — CBC WITH DIFFERENTIAL/PLATELET
Abs Immature Granulocytes: 0.02 10*3/uL (ref 0.00–0.07)
Basophils Absolute: 0 10*3/uL (ref 0.0–0.1)
Basophils Relative: 0 %
Eosinophils Absolute: 0 10*3/uL (ref 0.0–0.5)
Eosinophils Relative: 0 %
HCT: 43.6 % (ref 36.0–46.0)
Hemoglobin: 14.2 g/dL (ref 12.0–15.0)
Immature Granulocytes: 0 %
Lymphocytes Relative: 23 %
Lymphs Abs: 1.8 10*3/uL (ref 0.7–4.0)
MCH: 28.1 pg (ref 26.0–34.0)
MCHC: 32.6 g/dL (ref 30.0–36.0)
MCV: 86.3 fL (ref 80.0–100.0)
Monocytes Absolute: 0.5 10*3/uL (ref 0.1–1.0)
Monocytes Relative: 6 %
Neutro Abs: 5.5 10*3/uL (ref 1.7–7.7)
Neutrophils Relative %: 71 %
Platelets: 277 10*3/uL (ref 150–400)
RBC: 5.05 MIL/uL (ref 3.87–5.11)
RDW: 14.6 % (ref 11.5–15.5)
WBC: 7.9 10*3/uL (ref 4.0–10.5)
nRBC: 0 % (ref 0.0–0.2)

## 2021-09-14 LAB — ECHOCARDIOGRAM COMPLETE
Area-P 1/2: 3.08 cm2
Calc EF: 75.9 %
Height: 64 in
S' Lateral: 2.9 cm
Single Plane A2C EF: 79.9 %
Single Plane A4C EF: 72.3 %
Weight: 2910.07 oz

## 2021-09-14 LAB — COMPREHENSIVE METABOLIC PANEL
ALT: 19 U/L (ref 0–44)
AST: 20 U/L (ref 15–41)
Albumin: 4 g/dL (ref 3.5–5.0)
Alkaline Phosphatase: 61 U/L (ref 38–126)
Anion gap: 8 (ref 5–15)
BUN: 15 mg/dL (ref 8–23)
CO2: 26 mmol/L (ref 22–32)
Calcium: 10.4 mg/dL — ABNORMAL HIGH (ref 8.9–10.3)
Chloride: 103 mmol/L (ref 98–111)
Creatinine, Ser: 0.32 mg/dL — ABNORMAL LOW (ref 0.44–1.00)
GFR, Estimated: >60 mL/min (ref >60)
Glucose, Bld: 109 mg/dL — ABNORMAL HIGH (ref 70–99)
Potassium: 3.9 mmol/L (ref 3.5–5.1)
Sodium: 137 mmol/L (ref 135–145)
Total Bilirubin: 0.7 mg/dL (ref 0.3–1.2)
Total Protein: 7.2 g/dL (ref 6.5–8.1)

## 2021-09-14 LAB — MAGNESIUM: Magnesium: 2 mg/dL (ref 1.7–2.4)

## 2021-09-14 LAB — HIV ANTIBODY (ROUTINE TESTING W REFLEX): HIV Screen 4th Generation wRfx: NONREACTIVE

## 2021-09-14 LAB — PHOSPHORUS: Phosphorus: 3.8 mg/dL (ref 2.5–4.6)

## 2021-09-14 MED ORDER — PERFLUTREN LIPID MICROSPHERE
1.0000 mL | INTRAVENOUS | Status: AC | PRN
Start: 2021-09-14 — End: 2021-09-14
  Administered 2021-09-14: 2 mL via INTRAVENOUS

## 2021-09-14 MED ORDER — ENOXAPARIN SODIUM 40 MG/0.4ML IJ SOSY: 80.0000 mg | PREFILLED_SYRINGE | Freq: Two times a day (BID) | INTRAMUSCULAR | Status: DC

## 2021-09-14 MED ORDER — IOHEXOL 350 MG/ML SOLN
80.0000 mL | Freq: Once | INTRAVENOUS | Status: AC | PRN
  Administered 2021-09-14: 80 mL via INTRAVENOUS

## 2021-09-14 MED ORDER — ENOXAPARIN SODIUM 80 MG/0.8ML IJ SOSY: 1.0000 mg/kg | PREFILLED_SYRINGE | Freq: Two times a day (BID) | INTRAMUSCULAR | 3 refills | Status: DC

## 2021-09-14 MED ORDER — ONDANSETRON HCL 4 MG PO TABS: 4.0000 mg | ORAL_TABLET | Freq: Four times a day (QID) | ORAL | 0 refills | Status: AC | PRN

## 2021-09-14 MED ORDER — ENOXAPARIN SODIUM 80 MG/0.8ML IJ SOSY
80.0000 mg | PREFILLED_SYRINGE | Freq: Two times a day (BID) | INTRAMUSCULAR | Status: DC
  Administered 2021-09-14 – 2021-09-15 (×2): 80 mg via SUBCUTANEOUS
  Filled 2021-09-14 (×2): qty 0.8

## 2021-09-14 MED ORDER — ENOXAPARIN SODIUM 100 MG/ML IJ SOSY: 1.0000 mg/kg | PREFILLED_SYRINGE | INTRAMUSCULAR | Status: DC

## 2021-09-14 NOTE — Progress Notes (Signed)
OT Cancellation Note  Patient Details Name: SABRE ROMBERGER MRN: [id number] DOB: 1956-02-28   Cancelled Treatment:    Reason Eval/Treat Not Completed: Other (comment) Patient is a long term care resident at SNF. Patient set to d/c back to SNF with PTAR already called at this time. Deferring to next venue of care at this time.    Jackelyn Poling OTR/L, [place] Acute Rehabilitation Department Office# [phone] Pager# [phone]    09/14/2021, 3:54 PM

## 2021-09-14 NOTE — Evaluation (Signed)
Physical Therapy Evaluation Patient Details Name: Dana Lang MRN: [id number] DOB: Nov 19, 1955 Today's Date: 09/14/2021  History of Present Illness  Pt is 65 yo female admitted with chest pain on 09/13/21.  Seen by cardiology and deemed stable for d/c. Pt with hx of anemia, osteoarthritis, DVT, hyperlipidemia, GI bleed due to PUD, hyperlipidemia, hypertension, osteopenia, panic attacks, PTSD, seasonal allergies.  Pt also with craniotomy and hemorrhage per daughter 01/28/21 leaving R side weak.  Clinical Impression  Pt admitted with above diagnosis. Unsure of pt's exact baseline.  She is from SNF and w/c bound with R sided weakness, but unsure of level of assist required for transfers bed and to w/c.  Pt completely inaccurate historian.  Pt has chronic R sided weakness and edema, but unsure of degree of weakness compared to now. Spoke with daughter, but she is in [place] and not sure of level of assist pt was requiring at SNF or if still working with therapy. Will address mobility while admitted to improve as able.  Pt currently with functional limitations due to the deficits listed below (see PT Problem List). Pt will benefit from skilled PT to increase their independence and safety with mobility to allow discharge to the venue listed below.          Recommendations for follow up therapy are one component of a multi-disciplinary discharge planning process, led by the attending physician.  Recommendations may be updated based on patient status, additional functional criteria and insurance authorization.  Follow Up Recommendations Skilled nursing-short term rehab (<3 hours/day)    Assistance Recommended at Discharge Frequent or constant Supervision/Assistance  Functional Status Assessment Patient has had a recent decline in their functional status and demonstrates the ability to make significant improvements in function in a reasonable and predictable amount of time. (unsure of level of decline -  unclear baseline; w/c bound but unsure if she could stand pivot)  Equipment Recommendations  None recommended by PT    Recommendations for Other Services       Precautions / Restrictions Precautions Precautions: Fall      Mobility  Bed Mobility Overal bed mobility: Needs Assistance Bed Mobility: Supine to Sit;Sit to Supine     Supine to sit: Mod assist;HOB elevated Sit to supine: Max assist;HOB elevated   General bed mobility comments: Increased cues for sequencing and relaxation.  Pt at EOB with legs off, just needing to scoot forward and became anxious stating "I can't, I can't"; able to calm pt and scoot forward.    Transfers Overall transfer level: Needs assistance   Transfers: Sit to/from Stand             General transfer comment: Attempted sit to stand with bed elevated and Max A but unable; tried multiple time with cues for relaxation and sequencing    Ambulation/Gait               General Gait Details: non ambulatory  Stairs            Wheelchair Mobility    Modified Rankin (Stroke Patients Only)       Balance Overall balance assessment: Needs assistance Sitting-balance support: Single extremity supported Sitting balance-Leahy Scale: Fair Sitting balance - Comments: Could sit EOB without UE support but prefered at least single UE support     Standing balance-Leahy Scale: Zero  Pertinent Vitals/Pain Pain Assessment: No/denies pain    Home Living Family/patient expects to be discharged to:: Skilled nursing facility ([facility])     Additional Comments: Pt completely inaccurate historian .  Reports was at home and independent , but this was prior to craniotomy in April.  Since that time with R weakness, at SNF, and w/c bound.    Prior Function Prior Level of Function : Needs assist             Mobility Comments: Pt from SNF with R sided weakness. W/C bound.  Unsure of exact amount of  assist pt needed to get to w/c. Spoke with dtr but she is in [place] and not sure what pt doing with therapy or if still working with therapy. ADLs Comments: Independent with ADLs and IADLs but slower, takes care of dogs     Hand Dominance        Extremity/Trunk Assessment   Upper Extremity Assessment Upper Extremity Assessment: LUE deficits/detail;RUE deficits/detail RUE Deficits / Details: ROM: limited shoulder elevation; MMT: shoulder 3/5, elbow 4/5, hand 4/5 LUE Deficits / Details: ROM WFL; MMT 5/5    Lower Extremity Assessment Lower Extremity Assessment: LLE deficits/detail;RLE deficits/detail RLE Deficits / Details: Significant edema (nearly double L side); ROM: ankle lacking 10 degrees to neutral, knee ~0 to 60 degrees, hip WFL; MMT: ankle 0/5, knee ext 1/5, hip 1/5 LLE Deficits / Details: ROM WFL; MMT 5/5    Cervical / Trunk Assessment Cervical / Trunk Assessment: Kyphotic  Communication   Communication: No difficulties  Cognition Arousal/Alertness: Awake/alert Behavior During Therapy: Anxious Overall Cognitive Status: No family/caregiver present to determine baseline cognitive functioning                                 General Comments: Unsure of baseline but pt oriented to self and place only, unable to provide accurate PLOF and frequently changing answers, very anxious with transfers, fear of falling, poor safety awareness and planning        General Comments General comments (skin integrity, edema, etc.): Spoke with dtr on phone post treatement.  Daughter concerned about edema and foot drop - some baseline but she was concerned it is worse.  Notified MD of daughter's concern and he looked into situation/history.    Exercises     Assessment/Plan    PT Assessment Patient needs continued PT services  PT Problem List Decreased strength;Decreased mobility;Decreased safety awareness;Decreased range of motion;Decreased activity tolerance;Decreased  balance;Decreased knowledge of use of DME;Decreased cognition       PT Treatment Interventions DME instruction;Therapeutic activities;Therapeutic exercise;Patient/family education;Balance training;Functional mobility training;Wheelchair mobility training    PT Goals (Current goals can be found in the Care Plan section)  Acute Rehab PT Goals Patient Stated Goal: unable to state PT Goal Formulation: With patient Time For Goal Achievement: 09/28/21 Potential to Achieve Goals: Poor    Frequency Min 2X/week   Barriers to discharge        Co-evaluation               AM-PAC PT "6 Clicks" Mobility  Outcome Measure Help needed turning from your back to your side while in a flat bed without using bedrails?: A Lot Help needed moving from lying on your back to sitting on the side of a flat bed without using bedrails?: A Lot Help needed moving to and from a bed to a chair (including a wheelchair)?: Total Help needed  standing up from a chair using your arms (e.g., wheelchair or bedside chair)?: Total Help needed to walk in hospital room?: Total Help needed climbing 3-5 steps with a railing? : Total 6 Click Score: 8    End of Session Equipment Utilized During Treatment: Gait belt Activity Tolerance: Other (comment) (limited by anxiousness) Patient left: in bed;with call bell/phone within reach;with bed alarm set Nurse Communication: Mobility status PT Visit Diagnosis: Other abnormalities of gait and mobility (R26.89);Muscle weakness (generalized) (M62.81)    Time: 1624-4695 PT Time Calculation (min) (ACUTE ONLY): 37 min   Charges:   PT Evaluation $PT Eval Moderate Complexity: 1 Mod PT Treatments $Therapeutic Activity: 8-22 mins        Abran Richard, PT Acute Rehab Services Pager [phone] [facility] Rehab [phone]   Karlton Lemon 09/14/2021, 4:09 PM

## 2021-09-14 NOTE — TOC Initial Note (Addendum)
Transition of Care [facility]) - Initial/Assessment Note    Patient Details  Name: Dana Lang MRN: [id number] Date of Birth: [birth year]  Transition of Care [facility]) CM/SW Contact:    Dana Cha, RN Phone Number: 09/14/2021, 9:25 AM  Clinical Narrative:                 65 y.o. female with a hx of HTN, HLD, bilat DVT 06/2021 on lovenox, PTSD w/ panic attacks, schizophrenia, meningioma s/p craniotomy and resection 01/2021,  who is being seen 09/14/2021 for the evaluation of chest pain at the request of Dr Dana Lang.   History of Present Illness:    Dana Lang has had 3 episodes of chest pain in her life.  She states the chest pain started in 2017.     She was hospitalized in September 2016 for chest pain, that pain was felt to be likely musculoskeletal, possible costochondritis, negative enzymes. ER visit July 2018 for irregular heartbeat associated with diet pills and lasting hours.  Her tachycardia improved without medications. ER visit July 2019 for chest pain, enzymes negative and pain resolved with Pepcid. Admission April 2022 for meningioma, at that time anesthesia stated she had a stress test at the [place] in 2015.  The note also describes multiple ED visits for chest pain.   Since her stroke, and the surgery for meningioma, she has been on Lovenox.  She was sent to the facility after the meningioma excision in April.  She is not able to use her right leg.   Her chest pain started at rest.  It was associated with nausea but no vomiting, and diaphoresis.  It did not change with deep inspiration.  There is no chest wall tenderness.  Patient 9/10.  It is the same as her other episodes of chest pain.   In the ER, she was given ASA and Tylenol.  She was given sublingual nitroglycerin, and this relieved her pain..  She says her blood pressure was very high and it was 220/120 on arrival.   The pain has not returned and currently she is resting comfortably.   She states that she had a stroke  and that has messed up her speech and made her right leg worse.  TOC PLAN OF CARE: following for toc and dc needs plan is to return home with self care. Progression: has above  PROGRESSION: tct theresa at [place] left for [facility] to return call to see if patient can go back today. Tcf-[place]-patient can return./ Transport packet completed and given to unit clerk for ptar.  Ptar called for transport at 1505. Expected Discharge Plan: Home/Self Care Barriers to Discharge: Continued Medical Work up   Patient Goals and CMS Choice Patient states their goals for this hospitalization and ongoing recovery are:: to go home CMS Medicare.gov Compare Post Acute Care list provided to:: Patient    Expected Discharge Plan and Services Expected Discharge Plan: Home/Self Care   Discharge Planning Services: CM Consult   Living arrangements for the past 2 months: Apartment                                      Prior Living Arrangements/Services Living arrangements for the past 2 months: Apartment Lives with:: Self Patient language and need for interpreter reviewed:: Yes                 Activities of Daily Living Home  Assistive Devices/Equipment: Environmental consultant (specify type) ADL Screening (condition at time of admission) Patient's cognitive ability adequate to safely complete daily activities?: Yes Is the patient deaf or have difficulty hearing?: No Does the patient have difficulty seeing, even when wearing glasses/contacts?: No Does the patient have difficulty concentrating, remembering, or making decisions?: No Patient able to express need for assistance with ADLs?: Yes Does the patient have difficulty dressing or bathing?: No Independently performs ADLs?: Yes (appropriate for developmental age) Does the patient have difficulty walking or climbing stairs?: Yes Weakness of Legs: Both Weakness of Arms/Hands: Both  Permission Sought/Granted                  Emotional  Assessment Appearance:: Appears stated age     Orientation: : Oriented to Self, Oriented to Place, Oriented to  Time, Oriented to Situation Alcohol / Substance Use: Not Applicable Psych Involvement: No (comment)  Admission diagnosis:  Chest pain [R07.9] Nonspecific chest pain [R07.9] Patient Active Problem List   Diagnosis Date Noted   Hyperlipidemia 09/13/2021   Osteoarthritis of knee 09/13/2021   Gastric ulcer 09/13/2021   Depression 09/13/2021   Cholelithiasis 09/13/2021   Constipation 09/13/2021   Carpal tunnel syndrome 09/13/2021   Anxiety state 09/13/2021   Leukocytosis    Pressure injury of skin 01/29/2021   S/P craniotomy 01/28/2021   Meningioma ([place]) 01/28/2021   Essential hypertension 10/30/2020   Chest pain 07/23/2015   Right arm pain 07/23/2015   Costochondritis, acute 07/23/2015   Pre-syncope 07/23/2015   Tobacco abuse 07/23/2015   Atypical chest pain 07/23/2015   Pain    PCP:  Center, [place]:   [facility] [place], [place] [id number] Phone: [phone] Fax: [phone]     Social Determinants of Health (SDOH) Interventions    Readmission Risk Interventions No flowsheet data found.

## 2021-09-14 NOTE — Consult Note (Addendum)
Cardiology Consultation:   Patient ID: [name] MRN: [id number]; DOB: 25-Feb-1956  Admit date: 09/13/2021 Date of Consult: 09/14/2021  PCP:  [facility] Cardiologist:  Kirk Ruths, MD     new   Patient Profile:   Dana Lang is a 65 y.o. 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  History of Present Illness:   Dana Lang has had 3 episodes of chest pain in her life.  She states the chest pain started in 2017.    She was hospitalized in September 2016 for chest pain, that pain was felt to be likely musculoskeletal, possible costochondritis, negative enzymes. ER visit July 2018 for irregular heartbeat associated with diet pills and lasting hours.  Her tachycardia improved without medications. ER visit July 2019 for chest pain, enzymes negative and pain resolved with Pepcid. Admission April 2022 for meningioma, at that time anesthesia stated she had a stress test at the [facility] in 2015.  The note also describes multiple ED visits for chest pain.  Since her stroke, and the surgery for meningioma, she has been on Lovenox.  She was sent to the facility after the meningioma excision in April.  She is not able to use her right leg.  Her chest pain started at rest.  It was associated with nausea but no vomiting, and diaphoresis.  It did not change with deep inspiration.  There is no chest wall tenderness.  Patient 9/10.  It is the same as her other episodes of chest pain.  In the ER, she was given ASA and Tylenol.  She was given sublingual nitroglycerin, and this relieved her pain..  She says her blood pressure was very high and it was 220/120 on arrival.  The pain has not returned and currently she is resting comfortably.  She states that she had  a stroke and that has messed up her speech and made her right leg worse.   Past Medical History:  Diagnosis Date   Anemia    Arthritis    DVT (deep venous thrombosis) (HCC)    High cholesterol    History of bleeding ulcers 1995   did not require blood transfusion. Dr Henrene Pastor could not find in patient records.   History of blood transfusion    Hyperlipidemia    Hypertension    Osteopenia    Osteopenia    Panic attacks    PTSD (post-traumatic stress disorder)    Schizophrenia ([place])    per daughter   Seasonal allergies     Past Surgical History:  Procedure Laterality Date   ABDOMINAL HYSTERECTOMY     APPENDECTOMY     APPLICATION OF CRANIAL NAVIGATION N/A 01/28/2021   Procedure: APPLICATION OF CRANIAL NAVIGATION;  Surgeon: Ashok Pall, MD;  Location: [facility];  Service: Neurosurgery;  Laterality: N/A;   CRANIOTOMY Left 01/28/2021   Procedure: 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;  Surgeon: Ashok Pall, MD;  Location: [facility];  Service: Neurosurgery;  Laterality: Left;   Dog bite  1970's   cosmetic surgery   TUBAL LIGATION       Home Medications:  Prior to Admission medications   Medication Sig Start Date End Date Taking? Authorizing Provider  alendronate (FOSAMAX) 70 MG tablet Take 70 mg by  mouth once a week. 12/18/20  Yes [provider]  Ascorbic Acid (VITAMIN C PO) Take 1,000 mg by mouth daily.    Yes [provider]  aspirin 81 MG tablet Take 81 mg by mouth daily.   Yes [provider]  b complex vitamins capsule Take 1 capsule by mouth daily.   Yes [provider]  Calcium Carb-Cholecalciferol (CALCIUM 1000 + D PO) Take 1,000 mg by mouth daily.   Yes [provider]  cetirizine (ZYRTEC) 10 MG tablet Take 10 mg by mouth daily as needed for allergies.   Yes [provider]  Cholecalciferol (VITAMIN D) 50 MCG (2000 UT) tablet Take 2,000 Units by mouth daily.   Yes [provider]   enoxaparin (LOVENOX) 40 MG/0.4ML injection Inject 40 mg into the skin daily. Patient states that this medication has been on hold for a week. 06/21/21  Yes [provider]  ferrous sulfate 325 (65 FE) MG tablet Take 325 mg by mouth every other day.   Yes [provider]  hydrochlorothiazide (HYDRODIURIL) 25 MG tablet Take 25 mg by mouth daily.   Yes [provider]  Multiple Vitamin (MULTIVITAMIN) capsule Take 1 capsule by mouth daily.   Yes [provider]  PRESCRIPTION MEDICATION Inject into the skin every 30 (thirty) days. Hydrocortisone injection   Yes [provider]  rosuvastatin (CRESTOR) 10 MG tablet Take 10 mg by mouth at bedtime. 01/06/21  Yes [provider]    Inpatient Medications: Scheduled Meds:  aspirin  81 mg Oral Daily   enoxaparin (LOVENOX) injection  40 mg Subcutaneous Q24H   ferrous sulfate  325 mg Oral QODAY   hydrochlorothiazide  25 mg Oral Daily   rosuvastatin  10 mg Oral QHS   Continuous Infusions:  PRN Meds: acetaminophen **OR** acetaminophen, ondansetron **OR** ondansetron (ZOFRAN) IV  Allergies:    Allergies  Allergen Reactions   Codeine Nausea And Vomiting    Social History:   Social History   Socioeconomic History   Marital status: Legally Separated    Spouse name: Not on file   Number of children: Not on file   Years of education: Not on file   Highest education level: Not on file  Occupational History   Not on file  Tobacco Use   Smoking status: Former    Packs/day: 0.25    Years: 40.00    Pack years: 10.00    Types: Cigarettes    Quit date: 12/2018    Years since quitting: 2.7   Smokeless tobacco: Never  Vaping Use   Vaping Use: Never used  Substance and Sexual Activity   Alcohol use: No   Drug use: No   Sexual activity: Not on file  Other Topics Concern   Not on file  Social History Narrative   Not on file   Social Determinants of Health   Financial Resource Strain: Not  on file  Food Insecurity: Not on file  Transportation Needs: Not on file  Physical Activity: Not on file  Stress: Not on file  Social Connections: Not on file  Intimate Partner Violence: Not on file    Family History:   Family History  Problem Relation Age of Onset   Esophageal cancer Mother    Heart attack Father 2     ROS:  Please see the history of present illness.  All other ROS reviewed and negative.     Physical Exam/Data:   Vitals:   09/13/21 2007 09/14/21 0006  09/14/21 0353 09/14/21 0854  BP: 138/80 131/78 (!) 148/86 (!) 142/80  Pulse: (!) 103 91 85 93  Resp: 17 16 17 18   Temp: 99.3 F (37.4 C) 98.1 F (36.7 C) 98.6 F (37 C) 98.5 F (36.9 C)  TempSrc: Oral Oral Oral Oral  SpO2: 95% 96% 96% 95%  Weight:      Height:        Intake/Output Summary (Last 24 hours) at 09/14/2021 0911 Last data filed at 09/14/2021 0356 Gross per 24 hour  Intake --  Output 750 ml  Net -750 ml   Last 3 Weights 09/13/2021 07/08/2021 01/28/2021  Weight (lbs) 181 lb 14.1 oz 205 lb 205 lb 4 oz  Weight (kg) 82.5 kg 92.987 kg 93.1 kg     Body mass index is 31.22 kg/m.  General:  Well nourished, well developed, in no acute distress HEENT: normal Neck: no JVD Vascular: No carotid bruits; Distal pulses 2+ bilaterally Cardiac:  normal S1, S2; RRR; no murmur  Lungs: rales R base, no wheezing, rhonchi  Abd: soft, nontender, no hepatomegaly  Ext: trace edema R, none L Musculoskeletal:  No deformities, BUE strength weak, does not move right leg Skin: warm and dry  Neuro:  CNs 2-12 intact, no focal abnormalities noted Psych:  Normal affect   EKG:  The EKG was personally reviewed and demonstrates: Sinus rhythm, heart rate 78, no acute ischemic changes, no pathologic Q waves and normal intervals Telemetry:  Telemetry was personally reviewed and demonstrates: Sinus rhythm  Relevant CV Studies: ECHO: Ordered  Laboratory Data:  High Sensitivity Troponin:   Recent Labs  Lab  09/13/21 0907 09/13/21 1104  TROPONINIHS 7 15     Chemistry Recent Labs  Lab 09/13/21 0907 09/13/21 1536  NA 139  --   K 3.5  --   CL 103  --   CO2 28  --   GLUCOSE 128*  --   BUN 13  --   CREATININE 0.36*  --   CALCIUM 10.0  --   MG  --  2.1  GFRNONAA >60  --   ANIONGAP 8  --     Recent Labs  Lab 09/13/21 0907  PROT 6.9  ALBUMIN 3.9  AST 20  ALT 16  ALKPHOS 58  BILITOT 0.6    Hematology Recent Labs  Lab 09/13/21 0907  WBC 11.1*  RBC 5.08  HGB 14.2  HCT 43.8  MCV 86.2  MCH 28.0  MCHC 32.4  RDW 14.2  PLT 263      Radiology/Studies:  DG Chest Portable 1 View  Result Date: 09/13/2021 CLINICAL DATA:  Chest pain. EXAM: PORTABLE CHEST 1 VIEW COMPARISON:  Chest radiograph 05/12/2018 FINDINGS: Stable cardiomediastinal contours. Calcifications noted in the aortic arch. Scattered minimal atelectasis, otherwise lungs are clear. No pneumothorax or large pleural effusion. No acute finding in the visualized skeleton. IMPRESSION: Scattered minimal atelectasis. No other acute finding. Electronically Signed   By: Audie Pinto M.D.   On: 09/13/2021 10:09     Assessment and Plan:   Chest pain -Cardiac enzymes are negative for MI - Her exertional level is very low, but she does not report chest pain with exertion - She has been having chest pain periodically for the last 5-7 years. - She has never had a stress test or ischemic evaluation - Discuss ischemic testing with MD, feel cardiac CT would be ideal, but with her history of surgery for a meningioma April 2022 and elevated heart rate and blood pressure,  that may not be the best test Otherwise, per IM   Risk Assessment/Risk Scores:     HEAR Score (for undifferentiated chest pain):  HEAR Score: 6 For questions or updates, please contact [place] Please consult [email or web address] for contact info under  Signed, Rosaria Ferries, PA-C  09/14/2021 9:11 AM As above, patient seen and examined.  Briefly she is a  65 year old female with past medical history of DVT, hyperlipidemia, peptic ulcer disease, hyperlipidemia, hypertension, panic attacks, PTSD for evaluation of chest pain.  Patient states she has had intermittent chest pain since 2017.  It occurs at rest and is in the left breast area without radiation.  There is associated nausea and diaphoresis but no dyspnea.  It can last half a day by her report.  Resolves spontaneously.  She developed chest pain yesterday morning that lasted throughout the day (approximate 11 hours total).  She presented to the emergency room and was admitted.  Cardiology now asked to evaluate.  Note she typically does not have dyspnea on exertion or exertional chest pain.  Electrocardiogram shows sinus rhythm with no ST changes. Troponins are negative.  Liver functions are normal.  Hemoglobin 14.2.  Chest x-ray showed minimal atelectasis.  1 chest pain-symptoms are extremely atypical.  Electrocardiogram shows no ST changes.  Troponins are normal.  An echocardiogram has been ordered.  If LV function is normal patient can be discharged and we will arrange an outpatient stress nuclear study.  2 hypertension-would continue preadmission medications and follow as an outpatient.  3 hyperlipidemia-continue statin.  Kirk Ruths, MD

## 2021-09-14 NOTE — Progress Notes (Signed)
Patient was set to be discharged back to her facility today given that her chest pain improved however on review of her medications she is just taking VTE prophylaxis dosing of Lovenox instead of full dose Lovenox dosing.  She has a history of bilateral DVTs and because she came with chest pain will need to evaluate for PE and so we will hold the discharge and obtain a CTA of the chest as well as lower extremity venous duplex.  I reviewed Dr. Tod Persia note who saw her back in September for her DVTs who recommended full dose anticoagulation for 6 to 12 months.  It is unclear why she is on subtherapeutic dosing at the facility but having chest pain with a negative cardiac work-up could be from possible PE so we will evaluate for this and place her on full dose Lovenox and hold her discharge tonight.

## 2021-09-14 NOTE — Discharge Summary (Addendum)
Physician Discharge Summary  Dana Lang QJJ:[id number] DOB: [birth year] DOA: 09/13/2021  PCP: [facility] date: 09/13/2021 Discharge date: 09/14/2021  Admitted From: SNF Disposition: SNF  Recommendations for Outpatient Follow-up:  Follow up with PCP in 1-2 weeks Follow up with Cardiology in the outpatient setting for Stress Test Please obtain CMP/CBC, Mag, Phos in one week Please follow up on the following pending results:  Home Health: No  Equipment/Devices: None    Discharge Condition: Stable  CODE STATUS: FULL CODE  Diet recommendation: Heart Healthy Diet   Brief/Interim Summary: HPI per Dr. Tennis Must on 09/13/21  Dana Lang is a 65 y.o. female with medical history significant of anemia, osteoarthritis, DVT, hyperlipidemia, GI bleed due to PUD, hyperlipidemia, hypertension, osteopenia, panic attacks, PTSD, seasonal allergies who is coming to the emergency department due to 7/10 dull, nonradiating chest pain associated with nausea and lightheadedness.  EMS noted her blood pressure is 210/120 mmHg.  They gave her multiple NTG tablets sublingually and aspirin.  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  She denied fever, chills, rhinorrhea, sore throat, wheezing or hemoptysis.  No diaphoresis, PND, orthopnea or recent pitting edema of the lower extremities.  Denied abdominal pain, emesis, diarrhea, melena or hematochezia.  She occasionally gets constipated.  No dysuria, flank pain, frequency or hematuria.  No blurry vision, polyuria, polydipsia or polyphagia.   ED Course: Initial vital signs were temperature 98.2 F, pulse 90, respiration rate 19, BP 144/85 mmHg O2 sat on 97% on room air.The patient received aspirin and multiple NTG tablets with chest pain resolution.   Lab work: CBC is her white count 11.1, hemoglobin 14.2 g/dL platelets 263.  Troponin was 7 and then 15 ng/L.  CMP showed a glucose of 128  and creatinine of 0.36 mg/dL.  The rest of chemistry was unremarkable.  EKG showed nonspecific ST abnormalities.   Imaging: A portable chest radiograph showed scatteredminimal atelectasis.  See image and radiology report for further details.  **Interim History She is evaluated by cardiology who felt that she was stable from a discharge perspective if her echocardiogram is normal.  She had a normal EF of 65 to 70%. Will check CTA of the Chest prior to D/C and pending to be done. Cardiology recommended outpatient nuclear stress test.  Her chest pain is resolved and she will be discharged back on her medications.  She will need to follow-up with PCP as well as cardiology in outpatient setting within 1 to 2 weeks  Discharge Diagnoses:  Principal Problem:   Chest pain Active Problems:   Hyperlipidemia   Essential hypertension   Leukocytosis  Chest pain rule out ACS -Observation/telemetry. -Continue aspirin 81 mg p.o. daily.   -Continue rosuvastatin 10 mg p.o. daily. -N.p.o. after midnight but now placed on Diet  -Obtain echocardiogram and showed G1DD and EF of 65-70% -Cardiology consulted and recommending outpatient Stress Testing.  -High risk for PE given bedbound status and DVTs; Repeat LE Duplex in the outpatient setting -Was minimally Tachycardic on admission  -Will check CTA PE prior to D/C to evaluate for PE -Will go to Full dose AC with 1 mg/kg of Lovenox  -Follow up with Cardiology in the outpatient setting    Hyperlipidemia -Continue Crestor 10 mg p.o. bedtime.   Essential hypertension -Continue HCTZ 25 mg p.o. daily. -Continue to Monitor BP per Protocol   Leukocytosis -Likely stress-induced and went from 11.1 -> 7.9 -Follow-up WBC in AM and  normalized   Hypercalcemia -Mild. Ca2+ is now 10.4 -Continue to Monitor and Trend and Repeat within 1 week  Hx of DVT -Had a Hx of DVT and last venous Duplex in September and showed Findings consistent with age indeterminate deep  vein thrombosis involving the right popliteal vein. Findings consistent with age indeterminate superficial vein thrombosis involving the right small saphenous vein. Portions of this examination were limited- see technologist comments above. -Of review of her records she should have FULL DOSE AC and was only taking 40 mg Lovenox Daily at the facility  -Repeat Venous Duplex in the outpatient setting   Obesity -Complicates overall prognosis and care -Estimated body mass index is 31.22 kg/m as calculated from the following:   Height as of this encounter: 5\' 4"  (1.626 m).   Weight as of this encounter: 82.5 kg. -Weight Loss and Dietary Counseling given   Discharge Instructions  Discharge Instructions     Call MD for:  difficulty breathing, headache or visual disturbances   Complete by: As directed    Call MD for:  extreme fatigue   Complete by: As directed    Call MD for:  hives   Complete by: As directed    Call MD for:  persistant dizziness or light-headedness   Complete by: As directed    Call MD for:  persistant nausea and vomiting   Complete by: As directed    Call MD for:  redness, tenderness, or signs of infection (pain, swelling, redness, odor or green/yellow discharge around incision site)   Complete by: As directed    Call MD for:  severe uncontrolled pain   Complete by: As directed    Call MD for:  temperature >100.4   Complete by: As directed    Diet - low sodium heart healthy   Complete by: As directed    Discharge instructions   Complete by: As directed    You were cared for by a hospitalist during your hospital stay. If you have any questions about your discharge medications or the care you received while you were in the hospital after you are discharged, you can call the unit and ask to speak with the hospitalist on call if the hospitalist that took care of you is not available. Once you are discharged, your primary care physician will handle any further medical  issues. Please note that NO REFILLS for any discharge medications will be authorized once you are discharged, as it is imperative that you return to your primary care physician (or establish a relationship with a primary care physician if you do not have one) for your aftercare needs so that they can reassess your need for medications and monitor your lab values.  Follow up with PCP and Cardiology as an outpatient. Take all medications as prescribed. If symptoms change or worsen please return to the ED for evaluation   Increase activity slowly   Complete by: As directed       Allergies as of 09/14/2021       Reactions   Codeine Nausea And Vomiting        Medication List     TAKE these medications    alendronate 70 MG tablet Commonly known as: FOSAMAX Take 70 mg by mouth once a week.   aspirin 81 MG tablet Take 81 mg by mouth daily.   b complex vitamins capsule Take 1 capsule by mouth daily.   CALCIUM 1000 + D PO Take 1,000 mg by mouth daily.  cetirizine 10 MG tablet Commonly known as: ZYRTEC Take 10 mg by mouth daily as needed for allergies.   enoxaparin 40 MG/0.4ML injection Commonly known as: LOVENOX Inject 40 mg into the skin daily. Patient states that this medication has been on hold for a week.   ferrous sulfate 325 (65 FE) MG tablet Take 325 mg by mouth every other day.   hydrochlorothiazide 25 MG tablet Commonly known as: HYDRODIURIL Take 25 mg by mouth daily.   multivitamin capsule Take 1 capsule by mouth daily.   ondansetron 4 MG tablet Commonly known as: ZOFRAN Take 1 tablet (4 mg total) by mouth every 6 (six) hours as needed for nausea.   PRESCRIPTION MEDICATION Inject into the skin every 30 (thirty) days. Hydrocortisone injection   rosuvastatin 10 MG tablet Commonly known as: CRESTOR Take 10 mg by mouth at bedtime.   VITAMIN C PO Take 1,000 mg by mouth daily.   Vitamin D 50 MCG (2000 UT) tablet Take 2,000 Units by mouth daily.         Allergies  Allergen Reactions   Codeine Nausea And Vomiting   Consultations: Cardiology  Procedures/Studies: DG Chest Portable 1 View  Result Date: 09/13/2021 CLINICAL DATA:  Chest pain. EXAM: PORTABLE CHEST 1 VIEW COMPARISON:  Chest radiograph 05/12/2018 FINDINGS: Stable cardiomediastinal contours. Calcifications noted in the aortic arch. Scattered minimal atelectasis, otherwise lungs are clear. No pneumothorax or large pleural effusion. No acute finding in the visualized skeleton. IMPRESSION: Scattered minimal atelectasis. No other acute finding. Electronically Signed   By: Audie Pinto M.D.   On: 09/13/2021 10:09   ECHOCARDIOGRAM COMPLETE  Result Date: 09/14/2021    ECHOCARDIOGRAM REPORT   Patient Name:   MARGUERITE BARBA Date of Exam: 09/14/2021 Medical Rec #:  [id number]         Height:       64.0 in Accession #:    [id number]        Weight:       181.9 lb Date of Birth:  1956-08-02         BSA:          1.879 m Patient Age:    8 years          BP:           142/80 mmHg Patient Gender: F                 HR:           93 bpm. Exam Location:  Inpatient Procedure: 2D Echo, Cardiac Doppler, Color Doppler and Intracardiac            Opacification Agent Indications:    Chest Pain R07.9  History:        Patient has no prior history of Echocardiogram examinations.                 Risk Factors:Dyslipidemia and Hypertension. Chronic DVT.  Sonographer:    Darlina Sicilian RDCS Referring Phys: [id number] DAVID MANUEL Corona  1. Left ventricular ejection fraction, by estimation, is 65 to 70%. The left ventricle has normal function. The left ventricle has no regional wall motion abnormalities. There is mild left ventricular hypertrophy. Left ventricular diastolic parameters are consistent with Grade I diastolic dysfunction (impaired relaxation).  2. Right ventricular systolic function is normal. The right ventricular size is normal. Tricuspid regurgitation signal is inadequate for assessing  PA pressure.  3. The mitral valve is grossly normal. No evidence of mitral valve regurgitation.  4. The aortic valve is tricuspid. Aortic valve regurgitation is not visualized.  5. The inferior vena cava is normal in size with greater than 50% respiratory variability, suggesting right atrial pressure of 3 mmHg. Comparison(s): No prior Echocardiogram. FINDINGS  Left Ventricle: Left ventricular ejection fraction, by estimation, is 65 to 70%. The left ventricle has normal function. The left ventricle has no regional wall motion abnormalities. The left ventricular internal cavity size was normal in size. There is  mild left ventricular hypertrophy. Left ventricular diastolic parameters are consistent with Grade I diastolic dysfunction (impaired relaxation). Indeterminate filling pressures. Right Ventricle: The right ventricular size is normal. No increase in right ventricular wall thickness. Right ventricular systolic function is normal. Tricuspid regurgitation signal is inadequate for assessing PA pressure. Left Atrium: Left atrial size was normal in size. Right Atrium: Right atrial size was normal in size. Pericardium: There is no evidence of pericardial effusion. Mitral Valve: The mitral valve is grossly normal. No evidence of mitral valve regurgitation. Tricuspid Valve: The tricuspid valve is not well visualized. Tricuspid valve regurgitation is not demonstrated. Aortic Valve: The aortic valve is tricuspid. Aortic valve regurgitation is not visualized. Pulmonic Valve: The pulmonic valve was normal in structure. Pulmonic valve regurgitation is not visualized. Aorta: The aortic root and ascending aorta are structurally normal, with no evidence of dilitation. Venous: The inferior vena cava is normal in size with greater than 50% respiratory variability, suggesting right atrial pressure of 3 mmHg. IAS/Shunts: No atrial level shunt detected by color flow Doppler.  LEFT VENTRICLE PLAX 2D LVIDd:         3.90 cm      Diastology LVIDs:         2.90 cm     LV e' medial:    5.66 cm/s LV PW:         0.80 cm     LV E/e' medial:  10.0 LV IVS:        1.10 cm     LV e' lateral:   4.79 cm/s LVOT diam:     2.10 cm     LV E/e' lateral: 11.8 LV SV:         50 LV SV Index:   27 LVOT Area:     3.46 cm  LV Volumes (MOD) LV vol d, MOD A2C: 67.7 ml LV vol d, MOD A4C: 70.5 ml LV vol s, MOD A2C: 13.6 ml LV vol s, MOD A4C: 19.5 ml LV SV MOD A2C:     54.1 ml LV SV MOD A4C:     70.5 ml LV SV MOD BP:      54.3 ml RIGHT VENTRICLE RV S prime:     17.90 cm/s TAPSE (M-mode): 1.9 cm LEFT ATRIUM           Index       RIGHT ATRIUM          Index LA diam:      2.40 cm 1.28 cm/m  RA Area:     7.41 cm LA Vol (A2C): 15.4 ml 8.20 ml/m  RA Volume:   12.90 ml 6.87 ml/m LA Vol (A4C): 15.3 ml 8.14 ml/m  AORTIC VALVE LVOT Vmax:   87.50 cm/s LVOT Vmean:  64.900 cm/s LVOT VTI:    0.145 m  AORTA Ao Root diam: 3.10 cm Ao Asc diam:  2.80 cm MITRAL VALVE MV Area (PHT): 3.08 cm    SHUNTS MV Decel Time: 246 msec    Systemic VTI:  0.14 m  MV E velocity: 56.60 cm/s  Systemic Diam: 2.10 cm MV A velocity: 66.00 cm/s MV E/A ratio:  0.86 Lyman Bishop MD Electronically signed by Lyman Bishop MD Signature Date/Time: 09/14/2021/12:18:26 PM    Final      Subjective: Seen and examined at bedside and was hungry and wanting something to eat.  Chest pain resolved.  Cardiology evaluated and recommending outpatient stress testing given that her echocardiogram was currently normal.  She denies any other concerns or complaints at this time and was ready to be discharged back to her facility.  Discharge Exam: Vitals:   09/14/21 0854 09/14/21 1254  BP: (!) 142/80 129/66  Pulse: 93 83  Resp: 18 16  Temp: 98.5 F (36.9 C) 97.7 F (36.5 C)  SpO2: 95% 96%   Vitals:   09/14/21 0006 09/14/21 0353 09/14/21 0854 09/14/21 1254  BP: 131/78 (!) 148/86 (!) 142/80 129/66  Pulse: 91 85 93 83  Resp: 16 17 18 16   Temp: 98.1 F (36.7 C) 98.6 F (37 C) 98.5 F (36.9 C) 97.7 F  (36.5 C)  TempSrc: Oral Oral Oral Oral  SpO2: 96% 96% 95% 96%  Weight:      Height:       General: Pt is alert, awake, not in acute distress Cardiovascular: RRR, S1/S2 +, no rubs, no gallops Respiratory: Diminished bilaterally, no wheezing, no rhonchi; Unlabored breathing and not wearing any supplemental O2 via [place] Abdominal: Soft, NT, ND, bowel sounds + Extremities: no edema, no cyanosis; Has a Right foot Drop  The results of significant diagnostics from this hospitalization (including imaging, microbiology, ancillary and laboratory) are listed below for reference.    Microbiology: Recent Results (from the past 240 hour(s))  Resp Panel by RT-PCR (Flu A&B, Covid) Nasopharyngeal Swab     Status: None   Collection Time: 09/13/21  1:55 PM   Specimen: Nasopharyngeal Swab; Nasopharyngeal(NP) swabs in vial transport medium  Result Value Ref Range Status   SARS Coronavirus 2 by RT PCR NEGATIVE NEGATIVE Final    Comment: (NOTE) SARS-CoV-2 target nucleic acids are NOT DETECTED.  The SARS-CoV-2 RNA is generally detectable in upper respiratory specimens during the acute phase of infection. The lowest concentration of SARS-CoV-2 viral copies this assay can detect is 138 copies/mL. A negative result does not preclude SARS-Cov-2 infection and should not be used as the sole basis for treatment or other patient management decisions. A negative result may occur with  improper specimen collection/handling, submission of specimen other than nasopharyngeal swab, presence of viral mutation(s) within the areas targeted by this assay, and inadequate number of viral copies(<138 copies/mL). A negative result must be combined with clinical observations, patient history, and epidemiological information. The expected result is Negative.  Fact Sheet for Patients:  EntrepreneurPulse.com.au  Fact Sheet for Healthcare Providers:  IncredibleEmployment.be  This test is no  t yet approved or cleared by the [place] FDA and  has been authorized for detection and/or diagnosis of SARS-CoV-2 by FDA under an Emergency Use Authorization (EUA). This EUA will remain  in effect (meaning this test can be used) for the duration of the COVID-19 declaration under Section 564(b)(1) of the Act, 21 U.S.C.section 360bbb-3(b)(1), unless the authorization is terminated  or revoked sooner.       Influenza A by PCR NEGATIVE NEGATIVE Final   Influenza B by PCR NEGATIVE NEGATIVE Final    Comment: (NOTE) The Xpert Xpress SARS-CoV-2/FLU/RSV plus assay is intended as an aid in the diagnosis of influenza from Nasopharyngeal swab  specimens and should not be used as a sole basis for treatment. Nasal washings and aspirates are unacceptable for Xpert Xpress SARS-CoV-2/FLU/RSV testing.  Fact Sheet for Patients: EntrepreneurPulse.com.au  Fact Sheet for Healthcare Providers: IncredibleEmployment.be  This test is not yet approved or cleared by the [place] FDA and has been authorized for detection and/or diagnosis of SARS-CoV-2 by FDA under an Emergency Use Authorization (EUA). This EUA will remain in effect (meaning this test can be used) for the duration of the COVID-19 declaration under Section 564(b)(1) of the Act, 21 U.S.C. section 360bbb-3(b)(1), unless the authorization is terminated or revoked.  Performed at [facility], [place] [address], [place], [place] [address]   MRSA Next Gen by PCR, Nasal     Status: None   Collection Time: 09/13/21  6:22 PM   Specimen: Nasal Mucosa; Nasal Swab  Result Value Ref Range Status   MRSA by PCR Next Gen NOT DETECTED NOT DETECTED Final    Comment: (NOTE) The GeneXpert MRSA Assay (FDA approved for NASAL specimens only), is one component of a comprehensive MRSA colonization surveillance program. It is not intended to diagnose MRSA infection nor to guide or monitor treatment  for MRSA infections. Test performance is not FDA approved in patients less than 55 years old. Performed at [facility], [place] [address], [place],  [address]      Labs: BNP (last 3 results) No results for input(s): BNP in the last 8760 hours. Basic Metabolic Panel: Recent Labs  Lab 09/13/21 0907 09/13/21 1536 09/14/21 0950  NA 139  --  137  K 3.5  --  3.9  CL 103  --  103  CO2 28  --  26  GLUCOSE 128*  --  109*  BUN 13  --  15  CREATININE 0.36*  --  0.32*  CALCIUM 10.0  --  10.4*  MG  --  2.1 2.0  PHOS  --   --  3.8   Liver Function Tests: Recent Labs  Lab 09/13/21 0907 09/14/21 0950  AST 20 20  ALT 16 19  ALKPHOS 58 61  BILITOT 0.6 0.7  PROT 6.9 7.2  ALBUMIN 3.9 4.0   No results for input(s): LIPASE, AMYLASE in the last 168 hours. No results for input(s): AMMONIA in the last 168 hours. CBC: Recent Labs  Lab 09/13/21 0907 09/14/21 0950  WBC 11.1* 7.9  NEUTROABS 9.5* 5.5  HGB 14.2 14.2  HCT 43.8 43.6  MCV 86.2 86.3  PLT 263 277   Cardiac Enzymes: No results for input(s): CKTOTAL, CKMB, CKMBINDEX, TROPONINI in the last 168 hours. BNP: Invalid input(s): POCBNP CBG: No results for input(s): GLUCAP in the last 168 hours. D-Dimer No results for input(s): DDIMER in the last 72 hours. Hgb A1c No results for input(s): HGBA1C in the last 72 hours. Lipid Profile No results for input(s): CHOL, HDL, LDLCALC, TRIG, CHOLHDL, LDLDIRECT in the last 72 hours. Thyroid function studies No results for input(s): TSH, T4TOTAL, T3FREE, THYROIDAB in the last 72 hours.  Invalid input(s): FREET3 Anemia work up No results for input(s): VITAMINB12, FOLATE, FERRITIN, TIBC, IRON, RETICCTPCT in the last 72 hours. Urinalysis    Component Value Date/Time   LABSPEC >=1.030 03/07/2019 0840   PHURINE 5.5 03/07/2019 0840   GLUCOSEU 100 (A) 03/07/2019 0840   HGBUR NEGATIVE 03/07/2019 0840   BILIRUBINUR SMALL (A) 03/07/2019 0840   KETONESUR TRACE (A)  03/07/2019 0840   PROTEINUR 100 (A) 03/07/2019 0840   UROBILINOGEN 0.2  03/07/2019 0840   NITRITE NEGATIVE 03/07/2019 0840   LEUKOCYTESUR TRACE (A) 03/07/2019 0840   Sepsis Labs Invalid input(s): PROCALCITONIN,  WBC,  LACTICIDVEN Microbiology Recent Results (from the past 240 hour(s))  Resp Panel by RT-PCR (Flu A&B, Covid) Nasopharyngeal Swab     Status: None   Collection Time: 09/13/21  1:55 PM   Specimen: Nasopharyngeal Swab; Nasopharyngeal(NP) swabs in vial transport medium  Result Value Ref Range Status   SARS Coronavirus 2 by RT PCR NEGATIVE NEGATIVE Final    Comment: (NOTE) SARS-CoV-2 target nucleic acids are NOT DETECTED.  The SARS-CoV-2 RNA is generally detectable in upper respiratory specimens during the acute phase of infection. The lowest concentration of SARS-CoV-2 viral copies this assay can detect is 138 copies/mL. A negative result does not preclude SARS-Cov-2 infection and should not be used as the sole basis for treatment or other patient management decisions. A negative result may occur with  improper specimen collection/handling, submission of specimen other than nasopharyngeal swab, presence of viral mutation(s) within the areas targeted by this assay, and inadequate number of viral copies(<138 copies/mL). A negative result must be combined with clinical observations, patient history, and epidemiological information. The expected result is Negative.  Fact Sheet for Patients:  EntrepreneurPulse.com.au  Fact Sheet for Healthcare Providers:  IncredibleEmployment.be  This test is no t yet approved or cleared by the [place] FDA and  has been authorized for detection and/or diagnosis of SARS-CoV-2 by FDA under an Emergency Use Authorization (EUA). This EUA will remain  in effect (meaning this test can be used) for the duration of the COVID-19 declaration under Section 564(b)(1) of the Act, 21 U.S.C.section 360bbb-3(b)(1),  unless the authorization is terminated  or revoked sooner.       Influenza A by PCR NEGATIVE NEGATIVE Final   Influenza B by PCR NEGATIVE NEGATIVE Final    Comment: (NOTE) The Xpert Xpress SARS-CoV-2/FLU/RSV plus assay is intended as an aid in the diagnosis of influenza from Nasopharyngeal swab specimens and should not be used as a sole basis for treatment. Nasal washings and aspirates are unacceptable for Xpert Xpress SARS-CoV-2/FLU/RSV testing.  Fact Sheet for Patients: EntrepreneurPulse.com.au  Fact Sheet for Healthcare Providers: IncredibleEmployment.be  This test is not yet approved or cleared by the [place] FDA and has been authorized for detection and/or diagnosis of SARS-CoV-2 by FDA under an Emergency Use Authorization (EUA). This EUA will remain in effect (meaning this test can be used) for the duration of the COVID-19 declaration under Section 564(b)(1) of the Act, 21 U.S.C. section 360bbb-3(b)(1), unless the authorization is terminated or revoked.  Performed at [facility], [place] [address] [place], [place] [address]   MRSA Next Gen by PCR, Nasal     Status: None   Collection Time: 09/13/21  6:22 PM   Specimen: Nasal Mucosa; Nasal Swab  Result Value Ref Range Status   MRSA by PCR Next Gen NOT DETECTED NOT DETECTED Final    Comment: (NOTE) The GeneXpert MRSA Assay (FDA approved for NASAL specimens only), is one component of a comprehensive MRSA colonization surveillance program. It is not intended to diagnose MRSA infection nor to guide or monitor treatment for MRSA infections. Test performance is not FDA approved in patients less than 69 years old. Performed at [facility], [place] [address] [place], [place] [address]    Time coordinating discharge: 35 minutes  SIGNED:  Kerney Elbe, DO [facility] 09/14/2021, 3:54 PM Pager is on [place]  If  7PM-7AM, please  contact night-coverage [email or web address]

## 2021-09-15 ENCOUNTER — Other Ambulatory Visit: Payer: Self-pay | Admitting: Physician Assistant

## 2021-09-15 ENCOUNTER — Observation Stay (HOSPITAL_BASED_OUTPATIENT_CLINIC_OR_DEPARTMENT_OTHER): Payer: Medicare Other

## 2021-09-15 DIAGNOSIS — I208 Other forms of angina pectoris: Secondary | ICD-10-CM

## 2021-09-15 DIAGNOSIS — M7989 Other specified soft tissue disorders: Secondary | ICD-10-CM | POA: Diagnosis not present

## 2021-09-15 DIAGNOSIS — I82403 Acute embolism and thrombosis of unspecified deep veins of lower extremity, bilateral: Secondary | ICD-10-CM

## 2021-09-15 MED ORDER — APIXABAN 5 MG PO TABS: ORAL_TABLET | ORAL | 0 refills | Status: DC

## 2021-09-15 MED ORDER — APIXABAN 5 MG PO TABS: 10.0000 mg | ORAL_TABLET | Freq: Two times a day (BID) | ORAL | Status: DC

## 2021-09-15 MED ORDER — APIXABAN 5 MG PO TABS: 5.0000 mg | ORAL_TABLET | Freq: Two times a day (BID) | ORAL | Status: DC

## 2021-09-15 NOTE — Progress Notes (Signed)
Report received from I. Oraegbunam,RN. No change in assessment. Continue plan of care. Analysia Dungee Johnson, RN  

## 2021-09-15 NOTE — Progress Notes (Signed)
BLE venous duplex has been completed.  Preliminary findings given to Jana Half Scientist, forensic) & messaged to Dr. Alfredia Ferguson.   Results can be found under chart review under CV PROC. 09/15/2021 11:34 AM Chiara Coltrin RVT, RDMS

## 2021-09-15 NOTE — Discharge Instructions (Signed)
Information on my medicine - ELIQUIS (apixaban) This medication education was reviewed with me or my healthcare representative as part of my discharge preparation.  The pharmacist that spoke with me during my hospital stay was: Altha Harm  Why was Eliquis prescribed for you? Eliquis was prescribed to treat blood clots that may have been found in the veins of your legs (deep vein thrombosis) or in your lungs (pulmonary embolism) and to reduce the risk of them occurring again.  What do You need to know about Eliquis ? The starting dose is 10 mg (two 5 mg tablets) taken TWICE daily for the FIRST SEVEN (7) DAYS, then on 09/22/2021  the dose is reduced to ONE 5 mg tablet taken TWICE daily.  Eliquis may be taken with or without food.   Try to take the dose about the same time in the morning and in the evening. If you have difficulty swallowing the tablet whole please discuss with your pharmacist how to take the medication safely.  Take Eliquis exactly as prescribed and DO NOT stop taking Eliquis without talking to the doctor who prescribed the medication.  Stopping may increase your risk of developing a new blood clot.  Refill your prescription before you run out.  After discharge, you should have regular check-up appointments with your healthcare provider that is prescribing your Eliquis.    What do you do if you miss a dose? If a dose of ELIQUIS is not taken at the scheduled time, take it as soon as possible on the same day and twice-daily administration should be resumed. The dose should not be doubled to make up for a missed dose.  Important Safety Information A possible side effect of Eliquis is bleeding. You should call your healthcare provider right away if you experience any of the following: Bleeding from an injury or your nose that does not stop. Unusual colored urine (red or dark brown) or unusual colored stools (red or black). Unusual bruising for unknown reasons. A serious fall  or if you hit your head (even if there is no bleeding).  Some medicines may interact with Eliquis and might increase your risk of bleeding or clotting while on Eliquis. To help avoid this, consult your healthcare provider or pharmacist prior to using any new prescription or non-prescription medications, including herbals, vitamins, non-steroidal anti-inflammatory drugs (NSAIDs) and supplements.  This website has more information on Eliquis (apixaban): http://[email or web address].com/eliquis/home

## 2021-09-15 NOTE — Progress Notes (Signed)
Arranged stress test and follow up per Dr. Jacalyn Lefevre recommendations.   Shared Decision Making/Informed Consent The risks [chest pain, shortness of breath, cardiac arrhythmias, dizziness, blood pressure fluctuations, myocardial infarction, stroke/transient ischemic attack, nausea, vomiting, allergic reaction, radiation exposure, metallic taste sensation and life-threatening complications (estimated to be 1 in 10,000)], benefits (risk stratification, diagnosing coronary artery disease, treatment guidance) and alternatives of a nuclear stress test were discussed in detail with Dana Lang and she agrees to proceed.

## 2021-09-15 NOTE — Discharge Summary (Signed)
Physician Discharge Summary  Dana Lang TGG:[id number] DOB: [birth year] DOA: 09/13/2021  [facility] [place] date: 09/13/2021 Discharge date: 09/15/2021  Admitted From: SNF Disposition: SNF  Recommendations for Outpatient Follow-up:  Follow up with PCP in 1-2 weeks Follow up with Cardiology in the outpatient setting for Stress Test [facility]  Please obtain CMP/CBC, Mag, Phos in one week Please follow up on the following pending results:  Home Health: No  Equipment/Devices: None    Discharge Condition: Stable  CODE STATUS: FULL CODE  Diet recommendation: Heart Healthy Diet   Brief/Interim Summary: HPI per Dr. Tennis Must on 09/13/21  Dana Lang is a 65 y.o. female with medical history significant of anemia, osteoarthritis, DVT, hyperlipidemia, GI bleed due to PUD, hyperlipidemia, hypertension, osteopenia, panic attacks, PTSD, seasonal allergies who is coming to the emergency department due to 7/10 dull, nonradiating chest pain associated with nausea and lightheadedness.  EMS noted her blood pressure is 210/120 mmHg.  They gave her multiple NTG tablets sublingually and aspirin.  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  She denied fever, chills, rhinorrhea, sore throat, wheezing or hemoptysis.  No diaphoresis, PND, orthopnea or recent pitting edema of the lower extremities.  Denied abdominal pain, emesis, diarrhea, melena or hematochezia.  She occasionally gets constipated.  No dysuria, flank pain, frequency or hematuria.  No blurry vision, polyuria, polydipsia or polyphagia.   ED Course: Initial vital signs were temperature 98.2 F, pulse 90, respiration rate 19, BP 144/85 mmHg O2 sat on 97% on room air.The patient received aspirin and multiple NTG tablets with chest pain resolution.   Lab work: CBC is her white count 11.1, hemoglobin 14.2 g/dL platelets 263.   Troponin was 7 and then 15 ng/L.  CMP showed a glucose of 128 and creatinine of 0.36 mg/dL.  The rest of chemistry was unremarkable.  EKG showed nonspecific ST abnormalities.   Imaging: A portable chest radiograph showed scatteredminimal atelectasis.  See image and radiology report for further details.  **Interim History She is evaluated by cardiology who felt that she was stable from a discharge perspective if her echocardiogram is normal.  She had a normal EF of 65 to 70%. Will check CTA of the Chest prior to D/C and it showed "No acute intrathoracic pathology. No CT evidence of central pulmonary embolism. Large sludge ball versus less likely a noncalcified stone within the gallbladder. Aortic Atherosclerosis and Emphysema."  LE Venous Duplex showed an Acute Right Femoral Vein DVT and Acute Left Femoral Vein and Left Poplliteal Vein with Age Indeterminate DVT in the Left Common Femoral Vein and SF junction. Because of these findings will change her to po Apixaban and stop Enoxaparin.    Cardiology recommended outpatient nuclear stress test.  Her chest pain is resolved and she will be discharged back on her medications.  She will need to follow-up with PCP as well as cardiology in outpatient setting within 1 to 2 weeks  Discharge Diagnoses:  Principal Problem:   Chest pain Active Problems:   Hyperlipidemia   Essential hypertension   Leukocytosis  Chest pain rule out ACS -Observation/telemetry. -Continue aspirin 81 mg p.o. daily.   -Continue rosuvastatin 10 mg p.o. daily. -N.p.o. after midnight but now placed on Diet  -Obtain echocardiogram and showed G1DD and EF of 65-70% -Cardiology consulted and recommending outpatient Stress Testing.  -High risk for PE given bedbound status and DVTs; Repeat LE Duplex  as below  -Was minimally Tachycardic on admission  -Will check CTA PE prior to D/C to evaluate for PE and was Negative for PE  -Will go to Full dose AC with 1 mg/kg of Lovenox and  change to po Apixaban  -Follow up with Cardiology in the outpatient setting    Hyperlipidemia -Continue Crestor 10 mg p.o. bedtime.   Essential hypertension -Continue HCTZ 25 mg p.o. daily. -Continue to Monitor BP per Protocol -Last BP was 118/61   Leukocytosis -Likely stress-induced and went from 11.1 -> 7.9 -Follow-up WBC in AM and normalized   Hypercalcemia -Mild. Ca2+ is now 10.4 -Continue to Monitor and Trend and Repeat within 1 week  Hx of DVT and now Acute DVT -Had a Hx of DVT and last venous Duplex in September and showed Findings consistent with age indeterminate deep vein thrombosis involving the right popliteal vein. Findings consistent with age indeterminate superficial vein thrombosis involving the right small saphenous vein. Portions of this examination were limited- see technologist comments above. -NO PE on CTA -LE Duplex showed an Acute Right Femoral Vein DVT and Acute Left Femoral Vein and Left Poplliteal Vein with Age Indeterminate DVT in the Left Common Femoral Vein and SF junction. Because of these findings will change her to po Apixaban and stop Enoxaparin.  -Of review of her records she should have FULL DOSE AC and was only taking 40 mg Lovenox Daily at the facility; Will change to Apixaban and have PCP follow  -Repeat Venous Duplex in the outpatient setting and follow up with Vascular Surgery in the outpatient setting and [facility] for 6-12 months   Obesity -Complicates overall prognosis and care -Estimated body mass index is 31.22 kg/m as calculated from the following:   Height as of this encounter: 5\' 4"  (1.626 m).   Weight as of this encounter: 82.5 kg. -Weight Loss and Dietary Counseling given   Discharge Instructions  Discharge Instructions     Call MD for:  difficulty breathing, headache or visual disturbances   Complete by: As directed    Call MD for:  extreme fatigue   Complete by: As directed    Call MD for:  hives   Complete by: As directed     Call MD for:  persistant dizziness or light-headedness   Complete by: As directed    Call MD for:  persistant nausea and vomiting   Complete by: As directed    Call MD for:  redness, tenderness, or signs of infection (pain, swelling, redness, odor or green/yellow discharge around incision site)   Complete by: As directed    Call MD for:  severe uncontrolled pain   Complete by: As directed    Call MD for:  temperature >100.4   Complete by: As directed    Diet - low sodium heart healthy   Complete by: As directed    Discharge instructions   Complete by: As directed    You were cared for by a hospitalist during your hospital stay. If you have any questions about your discharge medications or the care you received while you were in the hospital after you are discharged, you can call the unit and ask to speak with the hospitalist on call if the hospitalist that took care of you is not available. Once you are discharged, your primary care physician will handle any further medical issues. Please note that NO REFILLS for any discharge medications will be authorized once you are discharged, as it is imperative that you  return to your primary care physician (or establish a relationship with a primary care physician if you do not have one) for your aftercare needs so that they can reassess your need for medications and monitor your lab values.  Follow up with PCP and Cardiology as an outpatient. Take all medications as prescribed. If symptoms change or worsen please return to the ED for evaluation   Increase activity slowly   Complete by: As directed       Allergies as of 09/15/2021       Reactions   Codeine Nausea And Vomiting        Medication List     STOP taking these medications    enoxaparin 40 MG/0.4ML injection Commonly known as: LOVENOX       TAKE these medications    alendronate 70 MG tablet Commonly known as: FOSAMAX Take 70 mg by mouth once a week.   apixaban 5 MG  Tabs tablet Commonly known as: ELIQUIS Take 2 tablets (10 mg total) by mouth 2 (two) times daily for 7 days, THEN 1 tablet (5 mg total) 2 (two) times daily for 28 days. Start taking on: September 15, 2021   aspirin 81 MG tablet Take 81 mg by mouth daily.   b complex vitamins capsule Take 1 capsule by mouth daily.   CALCIUM 1000 + D PO Take 1,000 mg by mouth daily.   cetirizine 10 MG tablet Commonly known as: ZYRTEC Take 10 mg by mouth daily as needed for allergies.   ferrous sulfate 325 (65 FE) MG tablet Take 325 mg by mouth every other day.   hydrochlorothiazide 25 MG tablet Commonly known as: HYDRODIURIL Take 25 mg by mouth daily.   multivitamin capsule Take 1 capsule by mouth daily.   ondansetron 4 MG tablet Commonly known as: ZOFRAN Take 1 tablet (4 mg total) by mouth every 6 (six) hours as needed for nausea.   PRESCRIPTION MEDICATION Inject into the skin every 30 (thirty) days. Hydrocortisone injection   rosuvastatin 10 MG tablet Commonly known as: CRESTOR Take 10 mg by mouth at bedtime.   VITAMIN C PO Take 1,000 mg by mouth daily.   Vitamin D 50 MCG (2000 UT) tablet Take 2,000 Units by mouth daily.       Allergies  Allergen Reactions   Codeine Nausea And Vomiting   Consultations: Cardiology  Procedures/Studies: CT Angio Chest Pulmonary Embolism (PE) W or WO Contrast  Result Date: 09/14/2021 CLINICAL DATA:  Chest pain and shortness of breath. EXAM: CT ANGIOGRAPHY CHEST WITH CONTRAST TECHNIQUE: Multidetector CT imaging of the chest was performed using the standard protocol during bolus administration of intravenous contrast. Multiplanar CT image reconstructions and MIPs were obtained to evaluate the vascular anatomy. CONTRAST:  67mL OMNIPAQUE IOHEXOL 350 MG/ML SOLN COMPARISON:  Chest radiograph dated 09/13/2021. FINDINGS: Evaluation of this exam is limited due to respiratory motion artifact. Cardiovascular: There is no cardiomegaly or pericardial  effusion. There is coronary vascular calcification. Mild atherosclerotic calcification of the thoracic aorta. No aneurysmal dilatation or dissection. The origins of the great vessels of the aortic arch appear patent as visualized. Evaluation of the pulmonary arteries is limited due to respiratory motion artifact and suboptimal visualization of the peripheral branches. No pulmonary artery embolus identified. Mediastinum/Nodes: No hilar or mediastinal adenopathy. The esophagus is grossly unremarkable. No mediastinal fluid collection. Lungs/Pleura: No focal consolidation, pleural effusion, pneumothorax. Mild paraseptal emphysema. The central airways are patent. Upper Abdomen: Large sludge ball versus less likely a noncalcified stone within  the gallbladder. Musculoskeletal: Osteopenia with degenerative changes of the spine. No acute osseous pathology. Review of the MIP images confirms the above findings. IMPRESSION: 1. No acute intrathoracic pathology. No CT evidence of central pulmonary embolism. 2. Large sludge ball versus less likely a noncalcified stone within the gallbladder. 3. Aortic Atherosclerosis (ICD10-I70.0) and Emphysema (ICD10-J43.9). Electronically Signed   By: Anner Crete M.D.   On: 09/14/2021 19:26   DG Chest Portable 1 View  Result Date: 09/13/2021 CLINICAL DATA:  Chest pain. EXAM: PORTABLE CHEST 1 VIEW COMPARISON:  Chest radiograph 05/12/2018 FINDINGS: Stable cardiomediastinal contours. Calcifications noted in the aortic arch. Scattered minimal atelectasis, otherwise lungs are clear. No pneumothorax or large pleural effusion. No acute finding in the visualized skeleton. IMPRESSION: Scattered minimal atelectasis. No other acute finding. Electronically Signed   By: Audie Pinto M.D.   On: 09/13/2021 10:09   ECHOCARDIOGRAM COMPLETE  Result Date: 09/14/2021    ECHOCARDIOGRAM REPORT   Patient Name:   SANDHYA DENHERDER Date of Exam: 09/14/2021 Medical Rec #:  [id number]         Height:        64.0 in Accession #:    [id number]        Weight:       181.9 lb Date of Birth:  06/29/1956         BSA:          1.879 m Patient Age:    65 years          BP:           142/80 mmHg Patient Gender: F                 HR:           93 bpm. Exam Location:  Inpatient Procedure: 2D Echo, Cardiac Doppler, Color Doppler and Intracardiac            Opacification Agent Indications:    Chest Pain R07.9  History:        Patient has no prior history of Echocardiogram examinations.                 Risk Factors:Dyslipidemia and Hypertension. Chronic DVT.  Sonographer:    Darlina Sicilian RDCS Referring Phys: [id number] DAVID MANUEL [place]  1. Left ventricular ejection fraction, by estimation, is 65 to 70%. The left ventricle has normal function. The left ventricle has no regional wall motion abnormalities. There is mild left ventricular hypertrophy. Left ventricular diastolic parameters are consistent with Grade I diastolic dysfunction (impaired relaxation).  2. Right ventricular systolic function is normal. The right ventricular size is normal. Tricuspid regurgitation signal is inadequate for assessing PA pressure.  3. The mitral valve is grossly normal. No evidence of mitral valve regurgitation.  4. The aortic valve is tricuspid. Aortic valve regurgitation is not visualized.  5. The inferior vena cava is normal in size with greater than 50% respiratory variability, suggesting right atrial pressure of 3 mmHg. Comparison(s): No prior Echocardiogram. FINDINGS  Left Ventricle: Left ventricular ejection fraction, by estimation, is 65 to 70%. The left ventricle has normal function. The left ventricle has no regional wall motion abnormalities. The left ventricular internal cavity size was normal in size. There is  mild left ventricular hypertrophy. Left ventricular diastolic parameters are consistent with Grade I diastolic dysfunction (impaired relaxation). Indeterminate filling pressures. Right Ventricle: The right  ventricular size is normal. No increase in right ventricular wall thickness. Right ventricular systolic function is normal. Tricuspid  regurgitation signal is inadequate for assessing PA pressure. Left Atrium: Left atrial size was normal in size. Right Atrium: Right atrial size was normal in size. Pericardium: There is no evidence of pericardial effusion. Mitral Valve: The mitral valve is grossly normal. No evidence of mitral valve regurgitation. Tricuspid Valve: The tricuspid valve is not well visualized. Tricuspid valve regurgitation is not demonstrated. Aortic Valve: The aortic valve is tricuspid. Aortic valve regurgitation is not visualized. Pulmonic Valve: The pulmonic valve was normal in structure. Pulmonic valve regurgitation is not visualized. Aorta: The aortic root and ascending aorta are structurally normal, with no evidence of dilitation. Venous: The inferior vena cava is normal in size with greater than 50% respiratory variability, suggesting right atrial pressure of 3 mmHg. IAS/Shunts: No atrial level shunt detected by color flow Doppler.  LEFT VENTRICLE PLAX 2D LVIDd:         3.90 cm     Diastology LVIDs:         2.90 cm     LV e' medial:    5.66 cm/s LV PW:         0.80 cm     LV E/e' medial:  10.0 LV IVS:        1.10 cm     LV e' lateral:   4.79 cm/s LVOT diam:     2.10 cm     LV E/e' lateral: 11.8 LV SV:         50 LV SV Index:   27 LVOT Area:     3.46 cm  LV Volumes (MOD) LV vol d, MOD A2C: 67.7 ml LV vol d, MOD A4C: 70.5 ml LV vol s, MOD A2C: 13.6 ml LV vol s, MOD A4C: 19.5 ml LV SV MOD A2C:     54.1 ml LV SV MOD A4C:     70.5 ml LV SV MOD BP:      54.3 ml RIGHT VENTRICLE RV S prime:     17.90 cm/s TAPSE (M-mode): 1.9 cm LEFT ATRIUM           Index       RIGHT ATRIUM          Index LA diam:      2.40 cm 1.28 cm/m  RA Area:     7.41 cm LA Vol (A2C): 15.4 ml 8.20 ml/m  RA Volume:   12.90 ml 6.87 ml/m LA Vol (A4C): 15.3 ml 8.14 ml/m  AORTIC VALVE LVOT Vmax:   87.50 cm/s LVOT Vmean:  64.900 cm/s  LVOT VTI:    0.145 m  AORTA Ao Root diam: 3.10 cm Ao Asc diam:  2.80 cm MITRAL VALVE MV Area (PHT): 3.08 cm    SHUNTS MV Decel Time: 246 msec    Systemic VTI:  0.14 m MV E velocity: 56.60 cm/s  Systemic Diam: 2.10 cm MV A velocity: 66.00 cm/s MV E/A ratio:  0.86 Lyman Bishop MD Electronically signed by Lyman Bishop MD Signature Date/Time: 09/14/2021/12:18:26 PM    Final    VAS [place] LOWER EXTREMITY VENOUS (DVT)  Result Date: 09/15/2021  Lower Venous DVT Study Patient Name:  LYNDAL ALAMILLO  Date of Exam:   09/15/2021 Medical Rec #: [id number]          Accession #:    [id number] Date of Birth: [birth year]          Patient Gender: F Patient Age:   35 years Exam Location:  [facility] Procedure:      VAS [place]  LOWER EXTREMITY VENOUS (DVT) Referring Phys: Raiford Noble --------------------------------------------------------------------------------  Indications: Swelling.  Risk Factors: DVT Patient has history of BLE DVTs. Anticoagulation: Patient on sutherapeutic dose on Lovenox at SNF. Limitations: Body habitus, poor ultrasound/tissue interface and patient immobility, and patient uncooperative. Comparison Study: Previous exam on 07/08/2021 was positive for DVT in bilateral                   PopV & SVT in RLE SSV Performing Technologist: Rogelia Rohrer RVT, RDMS  Examination Guidelines: A complete evaluation includes B-mode imaging, spectral Doppler, color Doppler, and power Doppler as needed of all accessible portions of each vessel. Bilateral testing is considered an integral part of a complete examination. Limited examinations for reoccurring indications may be performed as noted. The reflux portion of the exam is performed with the patient in reverse Trendelenburg.  +---------+---------------+---------+-----------+----------+-------------------+ RIGHT    CompressibilityPhasicitySpontaneityPropertiesThrombus Aging      +---------+---------------+---------+-----------+----------+-------------------+ CFV       Full           Yes      Yes                                      +---------+---------------+---------+-----------+----------+-------------------+ SFJ      Full                                                             +---------+---------------+---------+-----------+----------+-------------------+ FV Prox  Partial        No       Yes                  Acute               +---------+---------------+---------+-----------+----------+-------------------+ FV Mid   None           No       No                   Acute               +---------+---------------+---------+-----------+----------+-------------------+ FV DistalNone           No       No                   Acute               +---------+---------------+---------+-----------+----------+-------------------+ PFV                     Yes      Yes                  patent by color and                                                       doppler             +---------+---------------+---------+-----------+----------+-------------------+ POP  Not visualized on                                                         this exam           +---------+---------------+---------+-----------+----------+-------------------+ PTV      Full                                         Not well visualized +---------+---------------+---------+-----------+----------+-------------------+ PERO                                                  Not visualized on                                                         this exam           +---------+---------------+---------+-----------+----------+-------------------+   Right Technical Findings: Not visualized segments include popliteal and peroneal veins.  +---------+---------------+---------+-----------+----------+-------------------+ LEFT     CompressibilityPhasicitySpontaneityPropertiesThrombus Aging       +---------+---------------+---------+-----------+----------+-------------------+ CFV      Partial        Yes      Yes                  Age Indeterminate   +---------+---------------+---------+-----------+----------+-------------------+ SFJ      Partial                                      Age Indeterminate   +---------+---------------+---------+-----------+----------+-------------------+ FV Prox  None           No       No                   Acute               +---------+---------------+---------+-----------+----------+-------------------+ FV Mid   None           No       No                   Acute               +---------+---------------+---------+-----------+----------+-------------------+ FV DistalNone           No       No                   Acute               +---------+---------------+---------+-----------+----------+-------------------+ PFV                     Yes      Yes                  patent by color and  doppler             +---------+---------------+---------+-----------+----------+-------------------+ POP      Partial        Yes      Yes                  Acute               +---------+---------------+---------+-----------+----------+-------------------+ PTV                                                   Not visualized on                                                         this exam           +---------+---------------+---------+-----------+----------+-------------------+ PERO                                                  Not visualized on                                                         this exam           +---------+---------------+---------+-----------+----------+-------------------+ EIV                                                   Not visualized on                                                         this exam            +---------+---------------+---------+-----------+----------+-------------------+ Unable to evaluate iliac system due to patient refusal.  Left Technical Findings: Not visualized segments include peroneal and posterior tibial veins.   Summary: BILATERAL: - No evidence of superficial venous thrombosis in the lower extremities, bilaterally. -No evidence of popliteal cyst, bilaterally. RIGHT: - Findings consistent with acute deep vein thrombosis involving the right femoral vein. - Portions of this examination were limited- see technologist comments above.  LEFT: - Findings consistent with acute deep vein thrombosis involving the left femoral vein, and left popliteal vein. - Findings consistent with age indeterminate deep vein thrombosis involving the left common femoral vein, and SF junction.  *See table(s) above for measurements and observations. Electronically signed by Deitra Mayo MD on 09/15/2021 at 12:16:31 PM.    Final      Subjective: Seen and examined at bedside and was hungry and wanting something to eat.  Chest pain resolved.  Cardiology evaluated and recommending outpatient stress testing given that her  echocardiogram was currently normal.  She denies any other concerns or complaints at this time and was ready to be discharged back to her facility.  Discharge Exam: Vitals:   09/15/21 0709 09/15/21 1211  BP: 118/61 110/62  Pulse: 79 93  Resp: 17 16  Temp: 98.7 F (37.1 C) 98.2 F (36.8 C)  SpO2: 96% 95%   Vitals:   09/14/21 2018 09/15/21 0504 09/15/21 0709 09/15/21 1211  BP: 126/73 136/75 118/61 110/62  Pulse: 94 87 79 93  Resp: 18 18 17 16   Temp: 98.6 F (37 C) 99 F (37.2 C) 98.7 F (37.1 C) 98.2 F (36.8 C)  TempSrc: Oral  Oral Oral  SpO2: 95% 95% 96% 95%  Weight:      Height:       General: Pt is alert, awake, not in acute distress Cardiovascular: RRR, S1/S2 +, no rubs, no gallops Respiratory: Diminished bilaterally, no wheezing, no rhonchi; Unlabored breathing and  not wearing any supplemental O2 via [place] Abdominal: Soft, NT, ND, bowel sounds + Extremities: no edema, no cyanosis; Has a Right foot Drop  The results of significant diagnostics from this hospitalization (including imaging, microbiology, ancillary and laboratory) are listed below for reference.    Microbiology: Recent Results (from the past 240 hour(s))  Resp Panel by RT-PCR (Flu A&B, Covid) Nasopharyngeal Swab     Status: None   Collection Time: 09/13/21  1:55 PM   Specimen: Nasopharyngeal Swab; Nasopharyngeal(NP) swabs in vial transport medium  Result Value Ref Range Status   SARS Coronavirus 2 by RT PCR NEGATIVE NEGATIVE Final    Comment: (NOTE) SARS-CoV-2 target nucleic acids are NOT DETECTED.  The SARS-CoV-2 RNA is generally detectable in upper respiratory specimens during the acute phase of infection. The lowest concentration of SARS-CoV-2 viral copies this assay can detect is 138 copies/mL. A negative result does not preclude SARS-Cov-2 infection and should not be used as the sole basis for treatment or other patient management decisions. A negative result may occur with  improper specimen collection/handling, submission of specimen other than nasopharyngeal swab, presence of viral mutation(s) within the areas targeted by this assay, and inadequate number of viral copies(<138 copies/mL). A negative result must be combined with clinical observations, patient history, and epidemiological information. The expected result is Negative.  Fact Sheet for Patients:  EntrepreneurPulse.com.au  Fact Sheet for Healthcare Providers:  IncredibleEmployment.be  This test is no t yet approved or cleared by the [place] FDA and  has been authorized for detection and/or diagnosis of SARS-CoV-2 by FDA under an Emergency Use Authorization (EUA). This EUA will remain  in effect (meaning this test can be used) for the duration of the COVID-19 declaration  under Section 564(b)(1) of the Act, 21 U.S.C.section 360bbb-3(b)(1), unless the authorization is terminated  or revoked sooner.       Influenza A by PCR NEGATIVE NEGATIVE Final   Influenza B by PCR NEGATIVE NEGATIVE Final    Comment: (NOTE) The Xpert Xpress SARS-CoV-2/FLU/RSV plus assay is intended as an aid in the diagnosis of influenza from Nasopharyngeal swab specimens and should not be used as a sole basis for treatment. Nasal washings and aspirates are unacceptable for Xpert Xpress SARS-CoV-2/FLU/RSV testing.  Fact Sheet for Patients: EntrepreneurPulse.com.au  Fact Sheet for Healthcare Providers: IncredibleEmployment.be  This test is not yet approved or cleared by the [place] FDA and has been authorized for detection and/or diagnosis of SARS-CoV-2 by FDA under an Emergency Use Authorization (EUA). This EUA will remain in effect (  meaning this test can be used) for the duration of the COVID-19 declaration under Section 564(b)(1) of the Act, 21 U.S.C. section 360bbb-3(b)(1), unless the authorization is terminated or revoked.  Performed at [facility], [place] [address], [place], [place] [address]   MRSA Next Gen by PCR, Nasal     Status: None   Collection Time: 09/13/21  6:22 PM   Specimen: Nasal Mucosa; Nasal Swab  Result Value Ref Range Status   MRSA by PCR Next Gen NOT DETECTED NOT DETECTED Final    Comment: (NOTE) The GeneXpert MRSA Assay (FDA approved for NASAL specimens only), is one component of a comprehensive MRSA colonization surveillance program. It is not intended to diagnose MRSA infection nor to guide or monitor treatment for MRSA infections. Test performance is not FDA approved in patients less than 9 years old. Performed at [facility], [place] [address] [place], [place] [address]     Labs: BNP (last 3 results) No results for input(s): BNP in the last 8760 hours. Basic  Metabolic Panel: Recent Labs  Lab 09/13/21 0907 09/13/21 1536 09/14/21 0950  NA 139  --  137  K 3.5  --  3.9  CL 103  --  103  CO2 28  --  26  GLUCOSE 128*  --  109*  BUN 13  --  15  CREATININE 0.36*  --  0.32*  CALCIUM 10.0  --  10.4*  MG  --  2.1 2.0  PHOS  --   --  3.8   Liver Function Tests: Recent Labs  Lab 09/13/21 0907 09/14/21 0950  AST 20 20  ALT 16 19  ALKPHOS 58 61  BILITOT 0.6 0.7  PROT 6.9 7.2  ALBUMIN 3.9 4.0   No results for input(s): LIPASE, AMYLASE in the last 168 hours. No results for input(s): AMMONIA in the last 168 hours. CBC: Recent Labs  Lab 09/13/21 0907 09/14/21 0950  WBC 11.1* 7.9  NEUTROABS 9.5* 5.5  HGB 14.2 14.2  HCT 43.8 43.6  MCV 86.2 86.3  PLT 263 277   Cardiac Enzymes: No results for input(s): CKTOTAL, CKMB, CKMBINDEX, TROPONINI in the last 168 hours. BNP: Invalid input(s): POCBNP CBG: No results for input(s): GLUCAP in the last 168 hours. D-Dimer No results for input(s): DDIMER in the last 72 hours. Hgb A1c No results for input(s): HGBA1C in the last 72 hours. Lipid Profile No results for input(s): CHOL, HDL, LDLCALC, TRIG, CHOLHDL, LDLDIRECT in the last 72 hours. Thyroid function studies No results for input(s): TSH, T4TOTAL, T3FREE, THYROIDAB in the last 72 hours.  Invalid input(s): FREET3 Anemia work up No results for input(s): VITAMINB12, FOLATE, FERRITIN, TIBC, IRON, RETICCTPCT in the last 72 hours. Urinalysis    Component Value Date/Time   LABSPEC >=1.030 03/07/2019 0840   PHURINE 5.5 03/07/2019 0840   GLUCOSEU 100 (A) 03/07/2019 0840   HGBUR NEGATIVE 03/07/2019 0840   BILIRUBINUR SMALL (A) 03/07/2019 0840   KETONESUR TRACE (A) 03/07/2019 0840   PROTEINUR 100 (A) 03/07/2019 0840   UROBILINOGEN 0.2 03/07/2019 0840   NITRITE NEGATIVE 03/07/2019 0840   LEUKOCYTESUR TRACE (A) 03/07/2019 0840   Sepsis Labs Invalid input(s): PROCALCITONIN,  WBC,  LACTICIDVEN Microbiology Recent Results (from the past 240  hour(s))  Resp Panel by RT-PCR (Flu A&B, Covid) Nasopharyngeal Swab     Status: None   Collection Time: 09/13/21  1:55 PM   Specimen: Nasopharyngeal Swab; Nasopharyngeal(NP) swabs in vial transport medium  Result Value Ref Range Status  SARS Coronavirus 2 by RT PCR NEGATIVE NEGATIVE Final    Comment: (NOTE) SARS-CoV-2 target nucleic acids are NOT DETECTED.  The SARS-CoV-2 RNA is generally detectable in upper respiratory specimens during the acute phase of infection. The lowest concentration of SARS-CoV-2 viral copies this assay can detect is 138 copies/mL. A negative result does not preclude SARS-Cov-2 infection and should not be used as the sole basis for treatment or other patient management decisions. A negative result may occur with  improper specimen collection/handling, submission of specimen other than nasopharyngeal swab, presence of viral mutation(s) within the areas targeted by this assay, and inadequate number of viral copies(<138 copies/mL). A negative result must be combined with clinical observations, patient history, and epidemiological information. The expected result is Negative.  Fact Sheet for Patients:  EntrepreneurPulse.com.au  Fact Sheet for Healthcare Providers:  IncredibleEmployment.be  This test is no t yet approved or cleared by the [place] FDA and  has been authorized for detection and/or diagnosis of SARS-CoV-2 by FDA under an Emergency Use Authorization (EUA). This EUA will remain  in effect (meaning this test can be used) for the duration of the COVID-19 declaration under Section 564(b)(1) of the Act, 21 U.S.C.section 360bbb-3(b)(1), unless the authorization is terminated  or revoked sooner.       Influenza A by PCR NEGATIVE NEGATIVE Final   Influenza B by PCR NEGATIVE NEGATIVE Final    Comment: (NOTE) The Xpert Xpress SARS-CoV-2/FLU/RSV plus assay is intended as an aid in the diagnosis of influenza from  Nasopharyngeal swab specimens and should not be used as a sole basis for treatment. Nasal washings and aspirates are unacceptable for Xpert Xpress SARS-CoV-2/FLU/RSV testing.  Fact Sheet for Patients: EntrepreneurPulse.com.au  Fact Sheet for Healthcare Providers: IncredibleEmployment.be  This test is not yet approved or cleared by the [place] FDA and has been authorized for detection and/or diagnosis of SARS-CoV-2 by FDA under an Emergency Use Authorization (EUA). This EUA will remain in effect (meaning this test can be used) for the duration of the COVID-19 declaration under Section 564(b)(1) of the Act, 21 U.S.C. section 360bbb-3(b)(1), unless the authorization is terminated or revoked.  Performed at [facility], [place] [address], [place], [place] [address]   MRSA Next Gen by PCR, Nasal     Status: None   Collection Time: 09/13/21  6:22 PM   Specimen: Nasal Mucosa; Nasal Swab  Result Value Ref Range Status   MRSA by PCR Next Gen NOT DETECTED NOT DETECTED Final    Comment: (NOTE) The GeneXpert MRSA Assay (FDA approved for NASAL specimens only), is one component of a comprehensive MRSA colonization surveillance program. It is not intended to diagnose MRSA infection nor to guide or monitor treatment for MRSA infections. Test performance is not FDA approved in patients less than 65 years old. Performed at [facility], [place] [address], [place], [place] [address]    Time coordinating discharge: 35 minutes  SIGNED:  Kerney Elbe, DO [facility] 09/15/2021, 12:21 PM Pager is on [place]  If 7PM-7AM, please contact night-coverage [email or web address]

## 2021-09-15 NOTE — Progress Notes (Signed)
Called [facility] facility to give report but unable to speak to anyone.

## 2021-09-15 NOTE — TOC Transition Note (Signed)
Transition of Care [facility]) - CM/SW Discharge Note   Patient Details  Name: Dana Lang MRN: [id number] Date of Birth: 1955-12-28  [facility]) CM/SW Contact:  Leeroy Cha, RN Phone Number: 09/15/2021, 2:09 PM   Clinical Narrative:    Tct-[facility] at [place] patient will go to room 115 A  call report to regular number see notes from [id number]. [facility] called for transport.  Final next level of care: Skilled Nursing Facility Barriers to Discharge: Barriers Resolved   Patient Goals and CMS Choice Patient states their goals for this hospitalization and ongoing recovery are:: to go home CMS Medicare.gov Compare Post Acute Care list provided to:: Patient    Discharge Placement                       Discharge Plan and Services   Discharge Planning Services: CM Consult                                 Social Determinants of Health (SDOH) Interventions     Readmission Risk Interventions No flowsheet data found.

## 2021-09-15 NOTE — TOC Progression Note (Signed)
Transition of Care [facility]) - Progression Note    Patient Details  Name: Dana Lang MRN: [id number] Date of Birth: 03-Sep-1956  [facility]) CM/SW Contact  Leeroy Cha, RN Phone Number: 09/15/2021, 12:58 PM  Clinical Narrative:    [facility] at [facility] notified that patient is ready to come back.  Transport form updatged   Expected Discharge Plan: [place] Barriers to Discharge: Barriers Resolved  Expected Discharge Plan and Services Expected Discharge Plan: [place]   Discharge Planning Services: CM Consult   Living arrangements for the past 2 months: [place] Expected Discharge Date: 09/15/21                                     Social Determinants of Health (SDOH) Interventions    Readmission Risk Interventions No flowsheet data found.

## 2021-09-15 NOTE — Plan of Care (Signed)
  Problem: Education: Goal: Knowledge of General Education information will improve Description Including pain rating scale, medication(s)/side effects and non-pharmacologic comfort measures Outcome: Progressing   

## 2021-09-16 ENCOUNTER — Telehealth: Payer: Self-pay | Admitting: Cardiology

## 2021-09-16 DIAGNOSIS — I208 Other forms of angina pectoris: Secondary | ICD-10-CM

## 2021-09-16 NOTE — Telephone Encounter (Signed)
Patient brother would like to speak to Dr. Stanford Breed.

## 2021-09-16 NOTE — Telephone Encounter (Signed)
Spoke with pt brother who is upset because he is the health care POA and has not been called about his sister. Explained to him I do not see anything in her chart that would tell us we need to talk to him. Questions regarding recent hospitalization answered.  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 Aware will forward the message to dr Stanford Breed to confirm the correct testing is ordered.

## 2021-09-16 NOTE — Telephone Encounter (Signed)
Spoke with pt brother, aware the test has been changed to a lexiscan.

## 2021-09-23 ENCOUNTER — Telehealth (HOSPITAL_COMMUNITY): Payer: Self-pay | Admitting: *Deleted

## 2021-09-29 ENCOUNTER — Telehealth (HOSPITAL_COMMUNITY): Payer: Self-pay | Admitting: Cardiology

## 2021-09-29 NOTE — Telephone Encounter (Signed)
Patients Myoview was cancelled for reason below:  09/28/2021 5:03 PM ZO:XWRUEAV, SHANA A  Cancel Rsn: Patient (brother called and stated pt does not want to do this test right now. she has to much going on)  Order will be removed from the [place].

## 2021-09-29 NOTE — Telephone Encounter (Signed)
Routed to primary nurse as Juluis Rainier

## 2021-09-30 ENCOUNTER — Encounter (HOSPITAL_COMMUNITY): Payer: Medicare Other

## 2021-10-23 NOTE — Progress Notes (Deleted)
Referring-[facility] Reason for referral-chest pain  HPI: 65 year old female for evaluation of chest pain at request of [facility].  I recently saw the patient in the hospital for chest pain.  Echocardiogram November 2022 showed normal LV function, mild left ventricular hypertrophy, grade 1 diastolic dysfunction.  Lower extremity venous Dopplers November 2022 showed acute DVT right femoral vein and left femoral vein.  CTA November 2022 showed no pulmonary embolus.  Troponins were normal.  Symptoms of chest pain were felt to be extremely atypical.  Outpatient stress test nuclear study recommended but has not been performed.  Since last seen  Current Outpatient Medications  Medication Sig Dispense Refill   alendronate (FOSAMAX) 70 MG tablet Take 70 mg by mouth once a week.     apixaban (ELIQUIS) 5 MG TABS tablet Take 2 tablets (10 mg total) by mouth 2 (two) times daily for 7 days, THEN 1 tablet (5 mg total) 2 (two) times daily for 28 days. 74 tablet 0   Ascorbic Acid (VITAMIN C PO) Take 1,000 mg by mouth daily.      aspirin 81 MG tablet Take 81 mg by mouth daily.     b complex vitamins capsule Take 1 capsule by mouth daily.     Calcium Carb-Cholecalciferol (CALCIUM 1000 + D PO) Take 1,000 mg by mouth daily.     cetirizine (ZYRTEC) 10 MG tablet Take 10 mg by mouth daily as needed for allergies.     Cholecalciferol (VITAMIN D) 50 MCG (2000 UT) tablet Take 2,000 Units by mouth daily.     ferrous sulfate 325 (65 FE) MG tablet Take 325 mg by mouth every other day.     hydrochlorothiazide (HYDRODIURIL) 25 MG tablet Take 25 mg by mouth daily.     Multiple Vitamin (MULTIVITAMIN) capsule Take 1 capsule by mouth daily.     ondansetron (ZOFRAN) 4 MG tablet Take 1 tablet (4 mg total) by mouth every 6 (six) hours as needed for nausea. 20 tablet 0   PRESCRIPTION MEDICATION Inject into the skin every 30 (thirty) days. Hydrocortisone injection     rosuvastatin (CRESTOR) 10 MG  tablet Take 10 mg by mouth at bedtime.     No current facility-administered medications for this visit.    Allergies  Allergen Reactions   Codeine Nausea And Vomiting     Past Medical History:  Diagnosis Date   Anemia    Arthritis    DVT (deep venous thrombosis) (HCC)    High cholesterol    History of bleeding ulcers 1995   did not require blood transfusion. Dr Henrene Pastor could not find in patient records.   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;  Surgeon: Ashok Pall, MD;  Location: [facility];  Service: Neurosurgery;  Laterality: Left;   Dog bite  1970's   cosmetic surgery   TUBAL  LIGATION      Social History   Socioeconomic History   Marital status: Legally Separated    Spouse name: Not on file   Number of children: Not on file   Years of education: Not on file   Highest education level: Not on file  Occupational History   Not on file  Tobacco Use   Smoking status: Former    Packs/day: 0.25    Years: 40.00    Pack years: 10.00    Types: Cigarettes    Quit date: 12/2018    Years since quitting: 2.8   Smokeless tobacco: Never  Vaping Use   Vaping Use: Never used  Substance and Sexual Activity   Alcohol use: No   Drug use: No   Sexual activity: Not on file  Other Topics Concern   Not on file  Social History Narrative   Not on file   Social Determinants of Health   Financial Resource Strain: Not on  file  Food Insecurity: Not on file  Transportation Needs: Not on file  Physical Activity: Not on file  Stress: Not on file  Social Connections: Not on file  Intimate Partner Violence: Not on file    Family History  Problem Relation Age of Onset   Esophageal cancer Mother    Heart attack Father 41    ROS: no fevers or chills, productive cough, hemoptysis, dysphasia, odynophagia, melena, hematochezia, dysuria, hematuria, rash, seizure activity, orthopnea, PND, pedal edema, claudication. Remaining systems are negative.  Physical Exam:   There were no vitals taken for this visit.  General:  Well developed/well nourished in NAD Skin warm/dry Patient not depressed No peripheral clubbing Back-normal HEENT-normal/normal eyelids Neck supple/normal carotid upstroke bilaterally; no bruits; no JVD; no thyromegaly chest - CTA/ normal expansion CV - RRR/normal S1 and S2; no murmurs, rubs or gallops;  PMI nondisplaced Abdomen -NT/ND, no HSM, no mass, + bowel sounds, no bruit 2+ femoral pulses, no bruits Ext-no edema, chords, 2+ DP Neuro-grossly nonfocal  ECG - personally reviewed  A/P  1 chest pain-we will arrange Lexiscan nuclear study for risk stratification.  As outlined above echocardiogram showed normal LV function.  2 hypertension-blood pressure is controlled.  Continue present medications.  3 hyperlipidemia-continue statin.  Kirk Ruths, MD

## 2021-11-03 ENCOUNTER — Ambulatory Visit: Payer: Medicare Other | Admitting: Cardiology

## 2021-11-30 ENCOUNTER — Telehealth: Payer: Self-pay | Admitting: *Deleted

## 2021-11-30 ENCOUNTER — Other Ambulatory Visit: Payer: Self-pay | Admitting: *Deleted

## 2021-11-30 DIAGNOSIS — M7989 Other specified soft tissue disorders: Secondary | ICD-10-CM

## 2021-11-30 NOTE — Telephone Encounter (Signed)
Patient brother Sonia Side called he states his sister's  R leg is swollen and painful they are requesting an appt and [place] to check for a DVT since patient has had one in the past. Patient scheduled for [place] and [place] appt.

## 2021-12-02 ENCOUNTER — Other Ambulatory Visit: Payer: Self-pay

## 2021-12-02 ENCOUNTER — Encounter: Payer: Self-pay | Admitting: Vascular Surgery

## 2021-12-02 ENCOUNTER — Ambulatory Visit (HOSPITAL_COMMUNITY)
Admission: RE | Admit: 2021-12-02 | Discharge: 2021-12-02 | Disposition: A | Discharge status: Home / Self Care | Payer: Medicare Other | Source: Ambulatory Visit | Attending: Vascular Surgery | Admitting: Vascular Surgery

## 2021-12-02 ENCOUNTER — Ambulatory Visit (INDEPENDENT_AMBULATORY_CARE_PROVIDER_SITE_OTHER): Payer: Medicare Other | Admitting: Vascular Surgery

## 2021-12-02 VITALS — BP 120/69 | HR 110 | Temp 97.9°F | Resp 20 | Ht 64.0 in | Wt 182.0 lb

## 2021-12-02 DIAGNOSIS — M7989 Other specified soft tissue disorders: Secondary | ICD-10-CM | POA: Insufficient documentation

## 2021-12-02 DIAGNOSIS — I82413 Acute embolism and thrombosis of femoral vein, bilateral: Secondary | ICD-10-CM | POA: Diagnosis not present

## 2021-12-02 NOTE — Progress Notes (Signed)
Patient ID: Dana Lang, female   DOB: Mar 13, 1956, 66 y.o.   MRN: [id number]  Reason for Consult: Follow-up   Referred by Center, [facility]  Subjective:     HPI:  Dana Lang is a 66 y.o. female I previously seen 5 months ago for extensive DVT bilateral lower extremities in July of this past year.  She had had a tumor removed in the past she is mostly confined to a wheelchair and residing in a nursing home at this time.  She did not previously have a personal or family history of DVT.  She now presents with persistent right greater than left lower extremity swelling and pain.  She states that she is mostly in a wheelchair although when she is in bed she does attempt to elevate her leg.  She is intermittently wearing compression socks which she states does help.  She does not have any tissue loss or ulceration.  Venous duplex performed today prior to visit.  She remains on Xarelto.  Past Medical History:  Diagnosis Date   Anemia    Arthritis    DVT (deep venous thrombosis) (HCC)    High cholesterol    History of bleeding ulcers 1995   did not require blood transfusion. Dr Henrene Pastor could not find in patient records.   History of blood transfusion    Hyperlipidemia    Hypertension    Osteopenia    Osteopenia    Panic attacks    PTSD (post-traumatic stress disorder)    Schizophrenia ([place])    per daughter   Seasonal allergies    Family History  Problem Relation Age of Onset   Esophageal cancer Mother    Heart attack Father 70   Past Surgical History:  Procedure Laterality Date   ABDOMINAL HYSTERECTOMY     APPENDECTOMY     APPLICATION OF CRANIAL NAVIGATION N/A 01/28/2021   Procedure: APPLICATION OF CRANIAL NAVIGATION;  Surgeon: Ashok Pall, MD;  Location: [place];  Service: Neurosurgery;  Laterality: N/A;   CRANIOTOMY Left 01/28/2021   Procedure: 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;  Surgeon: Ashok Pall, MD;  Location: [place];   Service: Neurosurgery;  Laterality: Left;   Dog bite  1970's   cosmetic surgery   TUBAL LIGATION      Short Social History:  Social History   Tobacco Use   Smoking status: Former    Packs/day: 0.25    Years: 40.00    Pack years: 10.00    Types: Cigarettes    Quit date: 12/2018    Years since quitting: 2.9   Smokeless tobacco: Never  Substance Use Topics   Alcohol use: No    Allergies  Allergen Reactions   Codeine Nausea And Vomiting    Current Outpatient Medications  Medication Sig Dispense Refill   alendronate (FOSAMAX) 70 MG tablet Take 70 mg by mouth once a week.     Ascorbic Acid (VITAMIN C PO) Take 1,000 mg by mouth daily.      aspirin 81 MG tablet Take 81 mg by mouth daily.     b complex vitamins capsule Take 1 capsule by mouth daily.     Calcium Carb-Cholecalciferol (CALCIUM 1000 + D PO) Take 1,000 mg by mouth daily.     cetirizine (ZYRTEC) 10 MG tablet Take 10 mg by mouth daily as needed for allergies.     Cholecalciferol (VITAMIN D) 50 MCG (2000 UT) tablet Take 2,000 Units by mouth daily.  EZALLOR SPRINKLE 10 MG CPSP Take 1 capsule by mouth daily.     ferrous sulfate 325 (65 FE) MG tablet Take 325 mg by mouth every other day.     hydrochlorothiazide (HYDRODIURIL) 25 MG tablet Take 25 mg by mouth daily.     Multiple Vitamin (MULTIVITAMIN) capsule Take 1 capsule by mouth daily.     ondansetron (ZOFRAN) 4 MG tablet Take 1 tablet (4 mg total) by mouth every 6 (six) hours as needed for nausea. 20 tablet 0   PRESCRIPTION MEDICATION Inject into the skin every 30 (thirty) days. Hydrocortisone injection     rosuvastatin (CRESTOR) 10 MG tablet Take 10 mg by mouth at bedtime.     sertraline (ZOLOFT) 25 MG tablet Take 25 mg by mouth daily.     XARELTO 20 MG TABS tablet Take 20 mg by mouth daily.     No current facility-administered medications for this visit.    Review of Systems  Constitutional:  Constitutional negative. HENT: HENT negative.  Eyes: Eyes negative.   Cardiovascular: Positive for leg swelling.  Musculoskeletal: Positive for leg pain.  Neurological: Positive for focal weakness.  Hematologic: Hematologic/lymphatic negative.  Psychiatric: Psychiatric negative.       Objective:  Objective   Vitals:   12/02/21 0841  BP: 120/69  Pulse: (!) 110  Resp: 20  Temp: 97.9 F (36.6 C)  SpO2: 95%  Weight: 182 lb (82.6 kg)  Height: 5\' 4"  (1.626 m)   Body mass index is 31.24 kg/m.  Physical Exam HENT:     Head: Normocephalic.     Nose:     Comments: Wearing a mask Eyes:     Pupils: Pupils are equal, round, and reactive to light.  Abdominal:     General: Abdomen is flat.     Palpations: Abdomen is soft.  Musculoskeletal:     Comments: Significant swelling bilateral lower extremities with right being the greatest with significant pitting on the right  Skin:    General: Skin is warm.     Capillary Refill: Capillary refill takes less than 2 seconds.  Neurological:     Mental Status: She is alert.     Comments: Right upper and lower extremities are weak  Psychiatric:        Mood and Affect: Mood normal.        Behavior: Behavior normal.        Thought Content: Thought content normal.    Data: RIGHT     Compressibility Phasicity Spontaneity Properties      Thrombus  Aging   +---------+---------------+---------+-----------+---------------+----------  ----+   CFV       Full                                                                     +---------+---------------+---------+-----------+---------------+----------  ----+   SFJ       Full                                                                     +---------+---------------+---------+-----------+---------------+----------  ----+  FV Prox   None                      Yes         partially       Chronic                                                            re-cannalized                       +---------+---------------+---------+-----------+---------------+----------  ----+   FV Mid    None                      Yes         partially       Chronic                                                            re-cannalized                      +---------+---------------+---------+-----------+---------------+----------  ----+   FV Distal None                      Yes         partially       Chronic                                                            re-cannalized                      +---------+---------------+---------+-----------+---------------+----------  ----+   POP       None                      Yes         partially       Chronic                                                            re-cannalized                      +---------+---------------+---------+-----------+---------------+----------  ----+   Unable to visualize calf veins secondary to body habitus, tissue interface  and patient discomfort.       +----+---------------+---------+-----------+--------------------+----------  ---+   LEFT Compressibility Phasicity Spontaneity Properties           Thrombus  Aging            +----+---------------+---------+-----------+--------------------+----------  ---+   CFV  Partial                   Yes         partially            Chronic                                                      re-cannalized                          +----+---------------+---------+-----------+--------------------+----------  ---+      Summary:  RIGHT:  - Findings consistent with chronic deep vein thrombosis involving the  right femoral vein, and right popliteal vein.  - There is no evidence of superficial venous thrombosis.     - No cystic structure found in the popliteal fossa.     LEFT:  - Findings consistent with chronic deep vein thrombosis involving the left  common femoral vein.          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  She is nonambulatory at this time.I have recommended conservative management with compression stockings, leg elevation and activity as tolerated.  I do not see any future role for vascular surgery intervention and she can continue Xarelto as directed by her PCP.      Waynetta Sandy MD [facility]

## 2021-12-15 ENCOUNTER — Telehealth: Payer: Self-pay

## 2021-12-15 NOTE — Telephone Encounter (Signed)
Pt's brother Richrd Sox called to ask MD his thoughts on hyperbaric oxygen chamber to help with pt's LE swelling. He is going to call her PCP as well, but was very adamant he wanted to ask vascular.

## 2022-02-18 ENCOUNTER — Telehealth: Payer: Self-pay | Admitting: *Deleted

## 2022-02-18 NOTE — Telephone Encounter (Signed)
Patient brother called and was asking if patient needed follow up to see if her DVT had resolved. Informed him that Dr Donzetta Matters had stated no further intervention was needed from Vascular stand point. Reviewed instructions with patient brother per Dr Claretha Cooper note. Leg elevation,compression stockings, activity as tolerated and continue Xarelto. Patient should be followed by PCP. Patient brother verbalized understanding. ?

## 2022-03-17 ENCOUNTER — Ambulatory Visit: Payer: Self-pay | Admitting: Cardiology

## 2022-03-23 NOTE — Progress Notes (Signed)
HPI: Follow-up chest pain.  Patient seen in November 2022 in the hospital with atypical chest pain.  Enzymes negative.  Echocardiogram November 2022 showed normal LV function, mild left ventricular hypertrophy, grade 1 diastolic dysfunction.  Lower extremity venous Dopplers November 2022 showed acute DVT on the right and left.  CTA November 2022 showed no evidence of central pulmonary embolus.  Outpatient nuclear study ordered but not performed.  Follow-up venous Dopplers February 2023 showed chronic DVT bilaterally.  Since last seen she has continuous chest pain for the past 6 months without ever completely resolving.  It is under her left breast and also on the right chest area.  She denies dyspnea, palpitations or syncope.  Mild edema right ankle.  Note her chest pain is not pleuritic or positional.  Current Outpatient Medications  Medication Sig Dispense Refill   alendronate (FOSAMAX) 70 MG tablet Take 70 mg by mouth once a week.     Ascorbic Acid (VITAMIN C PO) Take 1,000 mg by mouth daily.      aspirin 81 MG tablet Take 81 mg by mouth daily.     b complex vitamins capsule Take 1 capsule by mouth daily.     Calcium Carb-Cholecalciferol (CALCIUM 1000 + D PO) Take 1,000 mg by mouth daily.     cetirizine (ZYRTEC) 10 MG tablet Take 10 mg by mouth daily as needed for allergies.     Cholecalciferol (VITAMIN D) 50 MCG (2000 UT) tablet Take 2,000 Units by mouth daily.     ferrous sulfate 325 (65 FE) MG tablet Take 325 mg by mouth every other day.     hydrochlorothiazide (HYDRODIURIL) 25 MG tablet Take 25 mg by mouth daily.     Multiple Vitamin (MULTIVITAMIN) capsule Take 1 capsule by mouth daily.     ondansetron (ZOFRAN) 4 MG tablet Take 1 tablet (4 mg total) by mouth every 6 (six) hours as needed for nausea. 20 tablet 0   PRESCRIPTION MEDICATION Inject into the skin every 30 (thirty) days. Hydrocortisone injection     rosuvastatin (CRESTOR) 10 MG tablet Take 10 mg by mouth at bedtime.      sertraline (ZOLOFT) 25 MG tablet Take 25 mg by mouth daily.     XARELTO 20 MG TABS tablet Take 20 mg by mouth daily.     EZALLOR SPRINKLE 10 MG CPSP Take 1 capsule by mouth daily.     No current facility-administered medications for this visit.     Past Medical History:  Diagnosis Date   Anemia    Arthritis    DVT (deep venous thrombosis) (HCC)    High cholesterol    History of bleeding ulcers 1995   did not require blood transfusion. Dr Henrene Pastor could not find in patient records.   History of blood transfusion    Hyperlipidemia    Hypertension    Osteopenia    Osteopenia    Panic attacks    PTSD (post-traumatic stress disorder)    Schizophrenia ([place])    per daughter   Seasonal allergies     Past Surgical History:  Procedure Laterality Date   ABDOMINAL HYSTERECTOMY     APPENDECTOMY     APPLICATION OF CRANIAL NAVIGATION N/A 01/28/2021   Procedure: APPLICATION OF CRANIAL NAVIGATION;  Surgeon: Ashok Pall, MD;  Location: [place];  Service: Neurosurgery;  Laterality: N/A;   CRANIOTOMY Left 01/28/2021   Procedure: 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;  Surgeon: Ashok Pall, MD;  Location:  [place] [facility];  Service: Neurosurgery;  Laterality: Left;   Dog bite  1970's   cosmetic surgery   TUBAL LIGATION      Social History   Socioeconomic History   Marital status: Legally Separated    Spouse name: Not on file   Number of children: Not on file   Years of education: Not on file   Highest education level: Not on file  Occupational History   Not on file  Tobacco Use   Smoking status: Former    Packs/day: 0.25    Years: 40.00    Pack years: 10.00    Types: Cigarettes    Quit date: 12/2018    Years since quitting: 3.2   Smokeless tobacco: Never  Vaping Use   Vaping Use: Never used  Substance and Sexual Activity   Alcohol use: No   Drug use: No   Sexual activity: Not on file  Other Topics Concern   Not on file  Social History Narrative   Not on  file   Social Determinants of Health   Financial Resource Strain: Not on file  Food Insecurity: Not on file  Transportation Needs: Not on file  Physical Activity: Not on file  Stress: Not on file  Social Connections: Not on file  Intimate Partner Violence: Not on file    Family History  Problem Relation Age of Onset   Esophageal cancer Mother    Heart attack Father 16    ROS: no fevers or chills, productive cough, hemoptysis, dysphasia, odynophagia, melena, hematochezia, dysuria, hematuria, rash, seizure activity, orthopnea, PND, claudication. Remaining systems are negative.  Physical Exam: Well-developed obese in no acute distress.  Skin is warm and dry.  HEENT is normal.  Neck is supple.  Chest is clear to auscultation with normal expansion.  Cardiovascular exam is regular rate and rhythm.  Abdominal exam nontender or distended. No masses palpated. Extremities show 1+ edema right foot. neuro grossly intact  ECG-sinus tachycardia at a rate of 118, no significant ST changes.  Personally reviewed  A/P  1 chest pain-symptoms are extremely atypical.  They have been continuous for 6 months without completely resolving.  Some reproduction with palpation.  Question musculoskeletal.  I do not think this is cardiac and does not require further ischemia evaluation.  2 hypertension-blood pressure controlled.  Continue present medications.  3 hyperlipidemia-continue statin.  4 history of DVT/pulmonary emboli-patient on Xarelto.  Management per primary care.  Given need for Xarelto will discontinue aspirin.  Kirk Ruths, MD

## 2022-03-30 ENCOUNTER — Ambulatory Visit (INDEPENDENT_AMBULATORY_CARE_PROVIDER_SITE_OTHER): Payer: Medicare Other | Admitting: Cardiology

## 2022-03-30 ENCOUNTER — Encounter: Payer: Self-pay | Admitting: Cardiology

## 2022-03-30 VITALS — BP 142/84 | HR 118 | Ht 67.0 in | Wt 179.0 lb

## 2022-03-30 DIAGNOSIS — E7800 Pure hypercholesterolemia, unspecified: Secondary | ICD-10-CM | POA: Diagnosis not present

## 2022-03-30 DIAGNOSIS — I10 Essential (primary) hypertension: Secondary | ICD-10-CM | POA: Diagnosis not present

## 2022-03-30 DIAGNOSIS — I208 Other forms of angina pectoris: Secondary | ICD-10-CM | POA: Diagnosis not present

## 2022-03-30 DIAGNOSIS — M7989 Other specified soft tissue disorders: Secondary | ICD-10-CM

## 2022-03-30 NOTE — Patient Instructions (Signed)
  Follow-Up: At [facility], you and your health needs are our priority.  As part of our continuing mission to provide you with exceptional heart care, we have created designated Provider Care Teams.  These Care Teams include your primary Cardiologist (physician) and Advanced Practice Providers (APPs -  Physician Assistants and Nurse Practitioners) who all work together to provide you with the care you need, when you need it.  We recommend signing up for the patient portal called "MyChart".  Sign up information is provided on this After Visit Summary.  MyChart is used to connect with patients for Virtual Visits (Telemedicine).  Patients are able to view lab/test results, encounter notes, upcoming appointments, etc.  Non-urgent messages can be sent to your provider as well.   To learn more about what you can do with MyChart, go to [email or web address].    Your next appointment:    AS NEEDED  :1}     Important Information About Sugar

## 2022-04-30 ENCOUNTER — Telehealth: Payer: Self-pay

## 2022-04-30 ENCOUNTER — Telehealth: Payer: Self-pay | Admitting: Cardiology

## 2022-04-30 NOTE — Telephone Encounter (Signed)
Pt's brother called asking for [facility] orders so pt could come home from a facility.  Reviewed pt's chart, returned call, two identifiers used. Informed him that from a vascular standpoint, Dr Donzetta Matters had released her from his care. He would need to f/u with cardiology or PCP. She no longer has a PCP since it's been over 2 yrs, so I recommended that he reach out to the facility and someone there could help him with orders at d/c. Confirmed understanding.

## 2022-04-30 NOTE — Telephone Encounter (Signed)
Patient's brother called stating he wants discharge orders to take his sister home from the facility she is at.  He wants the [facility] to come to the house to help her. He states someone from our office called earlier today about papers that were received from the [facility] for home health.

## 2022-05-04 NOTE — Telephone Encounter (Signed)
Spoke with pt brother, explained he will need to get the paperwork from her medical doctor. We saw the patient once and she was seeing [place] as needed. He will contact the [facility] as her previous medical doctor was there.

## 2022-05-07 ENCOUNTER — Telehealth: Payer: Self-pay

## 2022-05-07 NOTE — Telephone Encounter (Signed)
Pt's brother called requesting medical records for the pt.  Reviewed pt's chart, returned call, two identifiers used. Informed him that her office visit notes are printed and would be at the front so that he could pick them up after signing a release of information as he is her POA. Confirmed understanding.

## 2022-06-04 ENCOUNTER — Emergency Department (HOSPITAL_COMMUNITY): Payer: Medicare Other

## 2022-06-04 ENCOUNTER — Emergency Department (HOSPITAL_BASED_OUTPATIENT_CLINIC_OR_DEPARTMENT_OTHER)
Admit: 2022-06-04 | Discharge: 2022-06-04 | Disposition: A | Discharge status: Home / Self Care | Payer: Medicare Other | Attending: Emergency Medicine | Admitting: Emergency Medicine

## 2022-06-04 ENCOUNTER — Emergency Department (HOSPITAL_COMMUNITY)
Admission: EM | Admit: 2022-06-04 | Discharge: 2022-06-04 | Disposition: A | Discharge status: Home / Self Care | Payer: Medicare Other | Attending: Emergency Medicine | Admitting: Emergency Medicine

## 2022-06-04 ENCOUNTER — Encounter (HOSPITAL_COMMUNITY): Payer: Self-pay

## 2022-06-04 DIAGNOSIS — Z7982 Long term (current) use of aspirin: Secondary | ICD-10-CM | POA: Diagnosis not present

## 2022-06-04 DIAGNOSIS — M79671 Pain in right foot: Secondary | ICD-10-CM | POA: Insufficient documentation

## 2022-06-04 DIAGNOSIS — M25571 Pain in right ankle and joints of right foot: Secondary | ICD-10-CM | POA: Insufficient documentation

## 2022-06-04 DIAGNOSIS — Z7901 Long term (current) use of anticoagulants: Secondary | ICD-10-CM | POA: Insufficient documentation

## 2022-06-04 DIAGNOSIS — L03115 Cellulitis of right lower limb: Secondary | ICD-10-CM

## 2022-06-04 DIAGNOSIS — X501XXA Overexertion from prolonged static or awkward postures, initial encounter: Secondary | ICD-10-CM | POA: Diagnosis not present

## 2022-06-04 DIAGNOSIS — R5381 Other malaise: Secondary | ICD-10-CM

## 2022-06-04 DIAGNOSIS — M7989 Other specified soft tissue disorders: Secondary | ICD-10-CM

## 2022-06-04 DIAGNOSIS — W010XXA Fall on same level from slipping, tripping and stumbling without subsequent striking against object, initial encounter: Secondary | ICD-10-CM

## 2022-06-04 DIAGNOSIS — S93401A Sprain of unspecified ligament of right ankle, initial encounter: Secondary | ICD-10-CM

## 2022-06-04 DIAGNOSIS — M79609 Pain in unspecified limb: Secondary | ICD-10-CM

## 2022-06-04 LAB — CBC
HCT: 41.1 % (ref 36.0–46.0)
Hemoglobin: 12.1 g/dL (ref 12.0–15.0)
MCH: 25.1 pg — ABNORMAL LOW (ref 26.0–34.0)
MCHC: 29.4 g/dL — ABNORMAL LOW (ref 30.0–36.0)
MCV: 85.1 fL (ref 80.0–100.0)
Platelets: 399 10*3/uL (ref 150–400)
RBC: 4.83 MIL/uL (ref 3.87–5.11)
RDW: 16.1 % — ABNORMAL HIGH (ref 11.5–15.5)
WBC: 6.7 10*3/uL (ref 4.0–10.5)
nRBC: 0 % (ref 0.0–0.2)

## 2022-06-04 LAB — COMPREHENSIVE METABOLIC PANEL
ALT: 17 U/L (ref 0–44)
AST: 25 U/L (ref 15–41)
Albumin: 3.4 g/dL — ABNORMAL LOW (ref 3.5–5.0)
Alkaline Phosphatase: 44 U/L (ref 38–126)
Anion gap: 9 (ref 5–15)
BUN: 9 mg/dL (ref 8–23)
CO2: 24 mmol/L (ref 22–32)
Calcium: 9.8 mg/dL (ref 8.9–10.3)
Chloride: 107 mmol/L (ref 98–111)
Creatinine, Ser: 0.36 mg/dL — ABNORMAL LOW (ref 0.44–1.00)
GFR, Estimated: >60 mL/min (ref >60)
Glucose, Bld: 98 mg/dL (ref 70–99)
Potassium: 3.6 mmol/L (ref 3.5–5.1)
Sodium: 140 mmol/L (ref 135–145)
Total Bilirubin: 0.3 mg/dL (ref 0.3–1.2)
Total Protein: 6.7 g/dL (ref 6.5–8.1)

## 2022-06-04 MED ORDER — TRAMADOL HCL 50 MG PO TABS
50.0000 mg | ORAL_TABLET | Freq: Once | ORAL | Status: AC
  Administered 2022-06-04: 50 mg via ORAL
  Filled 2022-06-04: qty 1

## 2022-06-04 MED ORDER — CEPHALEXIN 500 MG PO CAPS: 500.0000 mg | ORAL_CAPSULE | Freq: Three times a day (TID) | ORAL | 0 refills | Status: AC

## 2022-06-04 MED ORDER — CEPHALEXIN 500 MG PO CAPS
500.0000 mg | ORAL_CAPSULE | Freq: Once | ORAL | Status: AC
  Administered 2022-06-04: 500 mg via ORAL
  Filled 2022-06-04: qty 1

## 2022-06-04 NOTE — Progress Notes (Signed)
Right lower extremity venous duplex has been completed. Preliminary results can be found in CV Proc through chart review.  Results were given to Dr. Ashok Cordia.  06/04/22 4:50 PM Dana Lang RVT

## 2022-06-04 NOTE — Progress Notes (Signed)
Orthopedic Tech Progress Note Patient Details:  Dana Lang 1956/10/24 [id number]  Ortho Devices Type of Ortho Device: Ankle splint, Ankle Air splint Ortho Device/Splint Location: Right ankle Ortho Device/Splint Interventions: Application   Post Interventions Patient Tolerated: Well  [name] [name] 06/04/2022, 8:28 PM

## 2022-06-04 NOTE — ED Triage Notes (Addendum)
Pt coming from home with c/o right foot injury x1 week. Pt states that she was at a rehab facility when she fell getting into bed, twisting her ankle. Denies hitting head, LOC. Does take xarelto. Pt states that she was discharged that same day after her fall. Pt formerly ambulatory before fall, unable to currently ambulate. Patient has been at home with brothers who are only able to give limited assistance d/t their age. Foot appears swollen at this time.

## 2022-06-04 NOTE — Discharge Instructions (Addendum)
It was our pleasure to provide your ER care today - we hope that you feel better.  Drink adequate fluids/stay well hydrated. Get adequate nutrition.   Wear ankle support/brace. Elevate foot/ankle as much as possible. Fall precautions - use walker and assistance when up.   Take keflex (antibiotic) as prescribed. Take acetaminophen as need.  We have made a home health referral for additional help/assistance at home, as well as PT - they should be contacting you soon.  You may also consider using [facility]  or other similar agencies for more help at home.   Follow up closely with your doctor in the coming week.  Return to ER if worse, new symptoms, fevers, chest pain, trouble breathing, or other concern.

## 2022-06-04 NOTE — ED Provider Notes (Signed)
[facility] DEPT Provider Note   CSN: [id number] Arrival date & time: 06/04/22  1419     History  Chief Complaint  Patient presents with   Foot Injury    KYANDRA MCCLAINE is a 66 y.o. female.  Pt with right foot/ankle/leg pain s/p fall a week ago, after twisting ankle/leg while about to get into bed. Dull pain to area, moderate, non radiating, worse w weight bearing. No head injury or loc w fall. No headaches since fall. No neck or back pain. No chest pain or sob. No abd pain. Skin intact. On xarelto and indicates compliant w rx. No leg/foot numbness/weakness.   The history is provided by the patient and medical records.  Foot Injury Associated symptoms: no back pain, no fever and no neck pain        Home Medications Prior to Admission medications   Medication Sig Start Date End Date Taking? Authorizing Provider  alendronate (FOSAMAX) 70 MG tablet Take 70 mg by mouth once a week. 12/18/20   [provider]  Ascorbic Acid (VITAMIN C PO) Take 1,000 mg by mouth daily.     [provider]  aspirin 81 MG tablet Take 81 mg by mouth daily.    [provider]  b complex vitamins capsule Take 1 capsule by mouth daily.    [provider]  Calcium Carb-Cholecalciferol (CALCIUM 1000 + D PO) Take 1,000 mg by mouth daily.    [provider]  cetirizine (ZYRTEC) 10 MG tablet Take 10 mg by mouth daily as needed for allergies.    [provider]  Cholecalciferol (VITAMIN D) 50 MCG (2000 UT) tablet Take 2,000 Units by mouth daily.    [provider]  EZALLOR SPRINKLE 10 MG CPSP Take 1 capsule by mouth daily. 11/20/21   [provider]  ferrous sulfate 325 (65 FE) MG tablet Take 325 mg by mouth every other day.    [provider]  hydrochlorothiazide (HYDRODIURIL) 25 MG tablet Take 25 mg by mouth daily.    [provider]  Multiple Vitamin (MULTIVITAMIN) capsule Take 1 capsule by  mouth daily.    [provider]  ondansetron (ZOFRAN) 4 MG tablet Take 1 tablet (4 mg total) by mouth every 6 (six) hours as needed for nausea. 09/14/21   Raiford Noble Latif, DO  PRESCRIPTION MEDICATION Inject into the skin every 30 (thirty) days. Hydrocortisone injection    [provider]  rosuvastatin (CRESTOR) 10 MG tablet Take 10 mg by mouth at bedtime. 01/06/21   [provider]  sertraline (ZOLOFT) 25 MG tablet Take 25 mg by mouth daily. 10/29/21   [provider]  XARELTO 20 MG TABS tablet Take 20 mg by mouth daily. 11/20/21   [provider]      Allergies    Codeine    Review of Systems   Review of Systems  Constitutional:  Negative for fever.  HENT:  Negative for nosebleeds.   Eyes:  Negative for redness and visual disturbance.  Respiratory:  Negative for shortness of breath.   Cardiovascular:  Negative for chest pain.  Gastrointestinal:  Negative for abdominal pain, blood in stool, diarrhea and vomiting.  Genitourinary:  Negative for dysuria, flank pain and hematuria.  Musculoskeletal:  Negative for back pain and neck pain.  Skin:  Negative for wound.  Neurological:  Negative for headaches.  Hematological:        On xarelto - no abnormal bruising/bleeding.   Psychiatric/Behavioral:  Negative for confusion.     Physical Exam Updated Vital Signs BP (!) 140/81 (BP Location: Left Arm)   Pulse 88   Temp 98.7 F (37.1 C) (Oral)   Resp 16   Ht 1.6 m ('5\' 3"'$ )   Wt 76.7 kg   SpO2 97%   BMI 29.94 kg/m  Physical Exam Vitals and nursing note reviewed.  Constitutional:      Appearance: Normal appearance. She is well-developed.  HENT:     Head: Atraumatic.     Comments: No head/scalp trauma or bruising.     Nose: Nose normal.     Mouth/Throat:     Mouth: Mucous membranes are moist.  Eyes:     General: No scleral icterus.    Conjunctiva/sclera: Conjunctivae normal.     Pupils: Pupils are equal, round, and reactive to light.   Neck:     Vascular: No carotid bruit.     Trachea: No tracheal deviation.  Cardiovascular:     Rate and Rhythm: Normal rate and regular rhythm.     Pulses: Normal pulses.     Heart sounds: Normal heart sounds. No murmur heard.    No friction rub. No gallop.  Pulmonary:     Effort: Pulmonary effort is normal. No respiratory distress.     Breath sounds: Normal breath sounds.  Chest:     Chest wall: No tenderness.  Abdominal:     General: Bowel sounds are normal. There is no distension.     Palpations: Abdomen is soft.     Tenderness: There is no abdominal tenderness.     Comments: No abd contusion.   Genitourinary:    Comments: No cva tenderness.  Musculoskeletal:        General: No swelling.     Cervical back: Normal range of motion and neck supple. No rigidity. No muscular tenderness.     Comments: CTLS spine, non tender, aligned, no step off. Tenderness from right hip, to lower femur, right prox tib/fib and ankle. Skin is intact. Distal pulses palp. RLU w mod swelling, compartments are soft, not tense. RLE is of normal color and warmth.   Skin:    General: Skin is warm and dry.     Findings: No rash.  Neurological:     Mental Status: She is alert.     Comments: Alert, speech normal. GCS 15. Motor/sens grossly intact bil. RLE nvi.   Psychiatric:        Mood and Affect: Mood normal.     ED Results / Procedures / Treatments   Labs (all labs ordered are listed, but only abnormal results are displayed) Results for orders placed or performed during the hospital encounter of 06/04/22  CBC  Result Value Ref Range   WBC 6.7 4.0 - 10.5 K/uL   RBC 4.83 3.87 - 5.11 MIL/uL   Hemoglobin 12.1 12.0 - 15.0 g/dL   HCT 41.1 36.0 - 46.0 %   MCV 85.1 80.0 - 100.0 fL   MCH 25.1 (L) 26.0 - 34.0 pg   MCHC 29.4 (L) 30.0 - 36.0 g/dL   RDW 16.1 (H) 11.5 - 15.5 %   Platelets 399 150 - 400 K/uL   nRBC 0.0 0.0 - 0.2 %  Comprehensive metabolic panel  Result Value Ref Range   Sodium 140 135  - 145 mmol/L   Potassium 3.6 3.5 - 5.1 mmol/L   Chloride 107 98 - 111 mmol/L   CO2 24 22 - 32 mmol/L   Glucose, Bld 98  70 - 99 mg/dL   BUN 9 8 - 23 mg/dL   Creatinine, Ser 0.36 (L) 0.44 - 1.00 mg/dL   Calcium 9.8 8.9 - 10.3 mg/dL   Total Protein 6.7 6.5 - 8.1 g/dL   Albumin 3.4 (L) 3.5 - 5.0 g/dL   AST 25 15 - 41 U/L   ALT 17 0 - 44 U/L   Alkaline Phosphatase 44 38 - 126 U/L   Total Bilirubin 0.3 0.3 - 1.2 mg/dL   GFR, Estimated >60 >60 mL/min   Anion gap 9 5 - 15   CT Ankle Right Wo Contrast  Result Date: 06/04/2022 CLINICAL DATA:  Status post trauma. EXAM: CT OF THE RIGHT ANKLE WITHOUT CONTRAST TECHNIQUE: Multidetector CT imaging of the right ankle was performed according to the standard protocol. Multiplanar CT image reconstructions were also generated. RADIATION DOSE REDUCTION: This exam was performed according to the departmental dose-optimization program which includes automated exposure control, adjustment of the mA and/or kV according to patient size and/or use of iterative reconstruction technique. COMPARISON:  None Available. FINDINGS: Bones/Joint/Cartilage There is diffuse demineralization of the osseous structures. There is no evidence of an acute fracture or dislocation. Small, chronic appearing deformities are seen along the distal tips of the right lateral malleolus and right medial malleolus. A moderate-sized plantar calcaneal spur is noted. There is moderate severity tibiotalar joint space narrowing without evidence of a joint effusion. Ligaments Suboptimally assessed by CT. Muscles and Tendons Limited in evaluation in the absence of intravenous contrast. Soft tissues Moderate to marked severity soft tissue swelling is seen involving the right ankle and visualized portion of the right foot. IMPRESSION: 1. Small, chronic appearing deformities along the distal tips of the right lateral malleolus and right medial malleolus. 2. Moderate to marked severity soft tissue swelling without  an acute osseous abnormality. 3. Moderate severity degenerative changes, as described above. Electronically Signed   By: Virgina Norfolk M.D.   On: 06/04/2022 19:47   CT Foot Right Wo Contrast  Result Date: 06/04/2022 CLINICAL DATA:  Status post fall. EXAM: CT OF THE RIGHT FOOT WITHOUT CONTRAST TECHNIQUE: Multidetector CT imaging of the right foot was performed according to the standard protocol. Multiplanar CT image reconstructions were also generated. RADIATION DOSE REDUCTION: This exam was performed according to the departmental dose-optimization program which includes automated exposure control, adjustment of the mA and/or kV according to patient size and/or use of iterative reconstruction technique. COMPARISON:  None Available. FINDINGS: Bones/Joint/Cartilage There is diffusely decreased mineralization of the osseous structures. There is no evidence of an acute fracture or dislocation. There is a moderate-sized plantar calcaneal spur. Moderate to marked severity interphalangeal joint space narrowing is seen. Moderate severity tibiotalar joint space narrowing is also noted. No joint effusion is identified. Ligaments Suboptimally assessed by CT. Muscles and Tendons Limited in evaluation in the absence of intravenous contrast. Soft tissues There is marked severity diffuse soft tissue swelling. This is most prominent along the dorsal aspects of the tarsals. IMPRESSION: 1. Marked severity diffuse soft tissue swelling without evidence of an acute fracture or dislocation. 2. Moderate to marked severity degenerative changes, as described above. 3. Moderate-sized plantar calcaneal spur. Electronically Signed   By: Virgina Norfolk M.D.   On: 06/04/2022 19:42   CT Knee Right Wo Contrast  Result Date: 06/04/2022 CLINICAL DATA:  Status post trauma 1 week ago. EXAM: CT OF THE RIGHT KNEE WITHOUT CONTRAST TECHNIQUE: Multidetector CT imaging of the right knee was performed according to the standard protocol.  Multiplanar CT image reconstructions were also generated. RADIATION DOSE REDUCTION: This exam was performed according to the departmental dose-optimization program which includes automated exposure control, adjustment of the mA and/or kV according to patient size and/or use of iterative reconstruction technique. COMPARISON:  None Available. FINDINGS: Bones/Joint/Cartilage There is no evidence of acute fracture or dislocation. A small chronic appearing deformity is seen involving the anterior aspect of the lateral tibial eminence. Medial and lateral marginal osteophyte formation is noted. There is moderate to marked severity medial and lateral tibiofemoral compartment space narrowing. Moderate severity patellofemoral narrowing is also noted. There is a small joint effusion. Ligaments Suboptimally assessed by CT. Muscles and Tendons Limited in evaluation absence of intravenous contrast. Soft tissues Mild to moderate severity soft tissue swelling is seen along the anterior and anterolateral aspects of the right patella. Marked severity edema and inflammatory fat stranding is seen within the adjacent subcutaneous fat. IMPRESSION: 1. Moderate to marked severity osteoarthritis of the right knee, as described above. 2. Small joint effusion. 3. No acute osseous abnormality. Electronically Signed   By: Virgina Norfolk M.D.   On: 06/04/2022 19:33   VAS [place] LOWER EXTREMITY VENOUS (DVT) (7a-7p)  Result Date: 06/04/2022  Lower Venous DVT Study Patient Name:  STEPHENY CANAL  Date of Exam:   06/04/2022 Medical Rec #: [id number]          Accession #:    [id number] Date of Birth: 03/10/1956          Patient Gender: F Patient Age:   32 years Exam Location:  [facility] [place] Procedure:      VAS [place] LOWER EXTREMITY VENOUS (DVT) Referring Phys: Lajean Saver --------------------------------------------------------------------------------  Indications: Swelling.  Risk Factors: None identified. Limitations: Body habitus, poor  ultrasound/tissue interface and patient positioning, patient immobility, patient pain tolerance. Comparison Study: No prior studies. Performing Technologist: Oliver Hum RVT  Examination Guidelines: A complete evaluation includes B-mode imaging, spectral Doppler, color Doppler, and power Doppler as needed of all accessible portions of each vessel. Bilateral testing is considered an integral part of a complete examination. Limited examinations for reoccurring indications may be performed as noted. The reflux portion of the exam is performed with the patient in reverse Trendelenburg.  +---------+---------------+---------+-----------+----------+-------------------+ RIGHT    CompressibilityPhasicitySpontaneityPropertiesThrombus Aging      +---------+---------------+---------+-----------+----------+-------------------+ CFV      Full           Yes      Yes                                      +---------+---------------+---------+-----------+----------+-------------------+ SFJ      Full                                                             +---------+---------------+---------+-----------+----------+-------------------+ FV Prox  Full                                                             +---------+---------------+---------+-----------+----------+-------------------+ FV Mid  Yes      Yes                                      +---------+---------------+---------+-----------+----------+-------------------+ FV Distal               Yes      Yes                                      +---------+---------------+---------+-----------+----------+-------------------+ PFV      Full                                                             +---------+---------------+---------+-----------+----------+-------------------+ POP      Full           Yes      Yes                                       +---------+---------------+---------+-----------+----------+-------------------+ PTV      Full                                                             +---------+---------------+---------+-----------+----------+-------------------+ PERO                                                  Not well visualized +---------+---------------+---------+-----------+----------+-------------------+   +----+---------------+---------+-----------+----------+--------------+ LEFTCompressibilityPhasicitySpontaneityPropertiesThrombus Aging +----+---------------+---------+-----------+----------+--------------+ CFV Full           Yes      Yes                                 +----+---------------+---------+-----------+----------+--------------+     Summary: RIGHT: - There is no evidence of deep vein thrombosis in the lower extremity. However, portions of this examination were limited- see technologist comments above.  - No cystic structure found in the popliteal fossa.  LEFT: - No evidence of common femoral vein obstruction.  *See table(s) above for measurements and observations.    Preliminary    DG Femur Min 2 Views Right  Result Date: 06/04/2022 CLINICAL DATA:  Fall 1 week ago.  Complete right leg and hip pain. EXAM: DG HIP (WITH OR WITHOUT PELVIS) 2-3V RIGHT; RIGHT FEMUR 2 VIEWS; RIGHT TIBIA AND FIBULA - 2 VIEW COMPARISON:  Right knee radiographs 07/11/2020 and CT abdomen and pelvis 05/12/2018 FINDINGS: Pelvis right hip and right femur: Mild bilateral superior femoroacetabular joint space narrowing. Mild bilateral sacroiliac subchondral sclerosis. High-grade stool is seen within the rectum. There is diffuse decreased bone mineralization. This limits evaluation for an acute nondisplaced fracture. There is moderate chronic enthesopathic change at the ischial tuberosity common hamstring tendon origin, similar to prior CT 05/11/2018. No definitive acute  fracture line is seen. Right tibia and fibula:  Diffuse decreased bone mineralization. No definite acute fracture line is seen. IMPRESSION: Diffuse decreased bone mineralization limits evaluation for an acute nondisplaced fracture. No definite fracture is seen within the pelvis, right hip, right femur, or right tibia/fibula. Electronically Signed   By: Yvonne Kendall M.D.   On: 06/04/2022 16:00   DG HIP UNILAT W OR W/O PELVIS 2-3 VIEWS RIGHT  Result Date: 06/04/2022 CLINICAL DATA:  Fall 1 week ago.  Complete right leg and hip pain. EXAM: DG HIP (WITH OR WITHOUT PELVIS) 2-3V RIGHT; RIGHT FEMUR 2 VIEWS; RIGHT TIBIA AND FIBULA - 2 VIEW COMPARISON:  Right knee radiographs 07/11/2020 and CT abdomen and pelvis 05/12/2018 FINDINGS: Pelvis right hip and right femur: Mild bilateral superior femoroacetabular joint space narrowing. Mild bilateral sacroiliac subchondral sclerosis. High-grade stool is seen within the rectum. There is diffuse decreased bone mineralization. This limits evaluation for an acute nondisplaced fracture. There is moderate chronic enthesopathic change at the ischial tuberosity common hamstring tendon origin, similar to prior CT 05/11/2018. No definitive acute fracture line is seen. Right tibia and fibula: Diffuse decreased bone mineralization. No definite acute fracture line is seen. IMPRESSION: Diffuse decreased bone mineralization limits evaluation for an acute nondisplaced fracture. No definite fracture is seen within the pelvis, right hip, right femur, or right tibia/fibula. Electronically Signed   By: Yvonne Kendall M.D.   On: 06/04/2022 16:00   DG Tibia/Fibula Right  Result Date: 06/04/2022 CLINICAL DATA:  Fall 1 week ago.  Complete right leg and hip pain. EXAM: DG HIP (WITH OR WITHOUT PELVIS) 2-3V RIGHT; RIGHT FEMUR 2 VIEWS; RIGHT TIBIA AND FIBULA - 2 VIEW COMPARISON:  Right knee radiographs 07/11/2020 and CT abdomen and pelvis 05/12/2018 FINDINGS: Pelvis right hip and right femur: Mild bilateral superior femoroacetabular joint space  narrowing. Mild bilateral sacroiliac subchondral sclerosis. High-grade stool is seen within the rectum. There is diffuse decreased bone mineralization. This limits evaluation for an acute nondisplaced fracture. There is moderate chronic enthesopathic change at the ischial tuberosity common hamstring tendon origin, similar to prior CT 05/11/2018. No definitive acute fracture line is seen. Right tibia and fibula: Diffuse decreased bone mineralization. No definite acute fracture line is seen. IMPRESSION: Diffuse decreased bone mineralization limits evaluation for an acute nondisplaced fracture. No definite fracture is seen within the pelvis, right hip, right femur, or right tibia/fibula. Electronically Signed   By: Yvonne Kendall M.D.   On: 06/04/2022 16:00   DG Ankle Complete Right  Result Date: 06/04/2022 CLINICAL DATA:  Fall 1 week ago. Right ankle and foot pain and swelling. EXAM: RIGHT ANKLE - COMPLETE 3+ VIEW; RIGHT FOOT COMPLETE - 3+ VIEW COMPARISON:  Right foot radiographs 12/12/2009 FINDINGS: Right ankle: There is diffuse decreased bone mineralization. Moderate distal medial malleolar degenerative osteophytes. Moderate posterior tibiotalar joint space narrowing on lateral view. Mild-to-moderate plantar calcaneal heel spur. No definite acute fracture is seen. No dislocation. Moderate lateral malleolar soft tissue swelling. Right foot: Diffuse decreased bone mineralization. Mild-to-moderate joint space narrowing of the interphalangeal joints diffusely. Moderate to high-grade soft tissue swelling dorsal to the distal metatarsals on lateral view. No definite acute fracture is seen. No dislocation. IMPRESSION: Diffuse decreased bone mineralization limits evaluation for an acute nondisplaced fracture. No definite acute fracture is seen, however there is high-grade soft tissue swelling at the dorsal lateral aspect of the foot at the level of the distal metatarsals. Mild-to-moderate plantar calcaneal heel spur.  Electronically Signed   By: Yvonne Kendall  M.D.   On: 06/04/2022 15:25   DG Foot Complete Right  Result Date: 06/04/2022 CLINICAL DATA:  Fall 1 week ago. Right ankle and foot pain and swelling. EXAM: RIGHT ANKLE - COMPLETE 3+ VIEW; RIGHT FOOT COMPLETE - 3+ VIEW COMPARISON:  Right foot radiographs 12/12/2009 FINDINGS: Right ankle: There is diffuse decreased bone mineralization. Moderate distal medial malleolar degenerative osteophytes. Moderate posterior tibiotalar joint space narrowing on lateral view. Mild-to-moderate plantar calcaneal heel spur. No definite acute fracture is seen. No dislocation. Moderate lateral malleolar soft tissue swelling. Right foot: Diffuse decreased bone mineralization. Mild-to-moderate joint space narrowing of the interphalangeal joints diffusely. Moderate to high-grade soft tissue swelling dorsal to the distal metatarsals on lateral view. No definite acute fracture is seen. No dislocation. IMPRESSION: Diffuse decreased bone mineralization limits evaluation for an acute nondisplaced fracture. No definite acute fracture is seen, however there is high-grade soft tissue swelling at the dorsal lateral aspect of the foot at the level of the distal metatarsals. Mild-to-moderate plantar calcaneal heel spur. Electronically Signed   By: Yvonne Kendall M.D.   On: 06/04/2022 15:25      EKG None  Radiology CT Ankle Right Wo Contrast  Result Date: 06/04/2022 CLINICAL DATA:  Status post trauma. EXAM: CT OF THE RIGHT ANKLE WITHOUT CONTRAST TECHNIQUE: Multidetector CT imaging of the right ankle was performed according to the standard protocol. Multiplanar CT image reconstructions were also generated. RADIATION DOSE REDUCTION: This exam was performed according to the departmental dose-optimization program which includes automated exposure control, adjustment of the mA and/or kV according to patient size and/or use of iterative reconstruction technique. COMPARISON:  None Available. FINDINGS:  Bones/Joint/Cartilage There is diffuse demineralization of the osseous structures. There is no evidence of an acute fracture or dislocation. Small, chronic appearing deformities are seen along the distal tips of the right lateral malleolus and right medial malleolus. A moderate-sized plantar calcaneal spur is noted. There is moderate severity tibiotalar joint space narrowing without evidence of a joint effusion. Ligaments Suboptimally assessed by CT. Muscles and Tendons Limited in evaluation in the absence of intravenous contrast. Soft tissues Moderate to marked severity soft tissue swelling is seen involving the right ankle and visualized portion of the right foot. IMPRESSION: 1. Small, chronic appearing deformities along the distal tips of the right lateral malleolus and right medial malleolus. 2. Moderate to marked severity soft tissue swelling without an acute osseous abnormality. 3. Moderate severity degenerative changes, as described above. Electronically Signed   By: Virgina Norfolk M.D.   On: 06/04/2022 19:47   CT Foot Right Wo Contrast  Result Date: 06/04/2022 CLINICAL DATA:  Status post fall. EXAM: CT OF THE RIGHT FOOT WITHOUT CONTRAST TECHNIQUE: Multidetector CT imaging of the right foot was performed according to the standard protocol. Multiplanar CT image reconstructions were also generated. RADIATION DOSE REDUCTION: This exam was performed according to the departmental dose-optimization program which includes automated exposure control, adjustment of the mA and/or kV according to patient size and/or use of iterative reconstruction technique. COMPARISON:  None Available. FINDINGS: Bones/Joint/Cartilage There is diffusely decreased mineralization of the osseous structures. There is no evidence of an acute fracture or dislocation. There is a moderate-sized plantar calcaneal spur. Moderate to marked severity interphalangeal joint space narrowing is seen. Moderate severity tibiotalar joint space  narrowing is also noted. No joint effusion is identified. Ligaments Suboptimally assessed by CT. Muscles and Tendons Limited in evaluation in the absence of intravenous contrast. Soft tissues There is marked severity diffuse soft tissue swelling. This  is most prominent along the dorsal aspects of the tarsals. IMPRESSION: 1. Marked severity diffuse soft tissue swelling without evidence of an acute fracture or dislocation. 2. Moderate to marked severity degenerative changes, as described above. 3. Moderate-sized plantar calcaneal spur. Electronically Signed   By: Virgina Norfolk M.D.   On: 06/04/2022 19:42   CT Knee Right Wo Contrast  Result Date: 06/04/2022 CLINICAL DATA:  Status post trauma 1 week ago. EXAM: CT OF THE RIGHT KNEE WITHOUT CONTRAST TECHNIQUE: Multidetector CT imaging of the right knee was performed according to the standard protocol. Multiplanar CT image reconstructions were also generated. RADIATION DOSE REDUCTION: This exam was performed according to the departmental dose-optimization program which includes automated exposure control, adjustment of the mA and/or kV according to patient size and/or use of iterative reconstruction technique. COMPARISON:  None Available. FINDINGS: Bones/Joint/Cartilage There is no evidence of acute fracture or dislocation. A small chronic appearing deformity is seen involving the anterior aspect of the lateral tibial eminence. Medial and lateral marginal osteophyte formation is noted. There is moderate to marked severity medial and lateral tibiofemoral compartment space narrowing. Moderate severity patellofemoral narrowing is also noted. There is a small joint effusion. Ligaments Suboptimally assessed by CT. Muscles and Tendons Limited in evaluation absence of intravenous contrast. Soft tissues Mild to moderate severity soft tissue swelling is seen along the anterior and anterolateral aspects of the right patella. Marked severity edema and inflammatory fat  stranding is seen within the adjacent subcutaneous fat. IMPRESSION: 1. Moderate to marked severity osteoarthritis of the right knee, as described above. 2. Small joint effusion. 3. No acute osseous abnormality. Electronically Signed   By: Virgina Norfolk M.D.   On: 06/04/2022 19:33   VAS [place] LOWER EXTREMITY VENOUS (DVT) (7a-7p)  Result Date: 06/04/2022  Lower Venous DVT Study Patient Name:  MYRTA MERCER  Date of Exam:   06/04/2022 Medical Rec #: [id number]          Accession #:    [id number] Date of Birth: 1956/01/08          Patient Gender: F Patient Age:   13 years Exam Location:  [facility] Procedure:      VAS [place] LOWER EXTREMITY VENOUS (DVT) Referring Phys: Lajean Saver --------------------------------------------------------------------------------  Indications: Swelling.  Risk Factors: None identified. Limitations: Body habitus, poor ultrasound/tissue interface and patient positioning, patient immobility, patient pain tolerance. Comparison Study: No prior studies. Performing Technologist: Oliver Hum RVT  Examination Guidelines: A complete evaluation includes B-mode imaging, spectral Doppler, color Doppler, and power Doppler as needed of all accessible portions of each vessel. Bilateral testing is considered an integral part of a complete examination. Limited examinations for reoccurring indications may be performed as noted. The reflux portion of the exam is performed with the patient in reverse Trendelenburg.  +---------+---------------+---------+-----------+----------+-------------------+ RIGHT    CompressibilityPhasicitySpontaneityPropertiesThrombus Aging      +---------+---------------+---------+-----------+----------+-------------------+ CFV      Full           Yes      Yes                                      +---------+---------------+---------+-----------+----------+-------------------+ SFJ      Full                                                              +---------+---------------+---------+-----------+----------+-------------------+  FV Prox  Full                                                             +---------+---------------+---------+-----------+----------+-------------------+ FV Mid                  Yes      Yes                                      +---------+---------------+---------+-----------+----------+-------------------+ FV Distal               Yes      Yes                                      +---------+---------------+---------+-----------+----------+-------------------+ PFV      Full                                                             +---------+---------------+---------+-----------+----------+-------------------+ POP      Full           Yes      Yes                                      +---------+---------------+---------+-----------+----------+-------------------+ PTV      Full                                                             +---------+---------------+---------+-----------+----------+-------------------+ PERO                                                  Not well visualized +---------+---------------+---------+-----------+----------+-------------------+   +----+---------------+---------+-----------+----------+--------------+ LEFTCompressibilityPhasicitySpontaneityPropertiesThrombus Aging +----+---------------+---------+-----------+----------+--------------+ CFV Full           Yes      Yes                                 +----+---------------+---------+-----------+----------+--------------+     Summary: RIGHT: - There is no evidence of deep vein thrombosis in the lower extremity. However, portions of this examination were limited- see technologist comments above.  - No cystic structure found in the popliteal fossa.  LEFT: - No evidence of common femoral vein obstruction.  *See table(s) above for measurements and observations.    Preliminary    DG Femur Min 2  Views Right  Result Date: 06/04/2022 CLINICAL DATA:  Fall 1 week ago.  Complete right leg and hip pain. EXAM: DG HIP (WITH OR WITHOUT PELVIS) 2-3V RIGHT; RIGHT  FEMUR 2 VIEWS; RIGHT TIBIA AND FIBULA - 2 VIEW COMPARISON:  Right knee radiographs 07/11/2020 and CT abdomen and pelvis 05/12/2018 FINDINGS: Pelvis right hip and right femur: Mild bilateral superior femoroacetabular joint space narrowing. Mild bilateral sacroiliac subchondral sclerosis. High-grade stool is seen within the rectum. There is diffuse decreased bone mineralization. This limits evaluation for an acute nondisplaced fracture. There is moderate chronic enthesopathic change at the ischial tuberosity common hamstring tendon origin, similar to prior CT 05/11/2018. No definitive acute fracture line is seen. Right tibia and fibula: Diffuse decreased bone mineralization. No definite acute fracture line is seen. IMPRESSION: Diffuse decreased bone mineralization limits evaluation for an acute nondisplaced fracture. No definite fracture is seen within the pelvis, right hip, right femur, or right tibia/fibula. Electronically Signed   By: Yvonne Kendall M.D.   On: 06/04/2022 16:00   DG HIP UNILAT W OR W/O PELVIS 2-3 VIEWS RIGHT  Result Date: 06/04/2022 CLINICAL DATA:  Fall 1 week ago.  Complete right leg and hip pain. EXAM: DG HIP (WITH OR WITHOUT PELVIS) 2-3V RIGHT; RIGHT FEMUR 2 VIEWS; RIGHT TIBIA AND FIBULA - 2 VIEW COMPARISON:  Right knee radiographs 07/11/2020 and CT abdomen and pelvis 05/12/2018 FINDINGS: Pelvis right hip and right femur: Mild bilateral superior femoroacetabular joint space narrowing. Mild bilateral sacroiliac subchondral sclerosis. High-grade stool is seen within the rectum. There is diffuse decreased bone mineralization. This limits evaluation for an acute nondisplaced fracture. There is moderate chronic enthesopathic change at the ischial tuberosity common hamstring tendon origin, similar to prior CT 05/11/2018. No definitive acute  fracture line is seen. Right tibia and fibula: Diffuse decreased bone mineralization. No definite acute fracture line is seen. IMPRESSION: Diffuse decreased bone mineralization limits evaluation for an acute nondisplaced fracture. No definite fracture is seen within the pelvis, right hip, right femur, or right tibia/fibula. Electronically Signed   By: Yvonne Kendall M.D.   On: 06/04/2022 16:00   DG Tibia/Fibula Right  Result Date: 06/04/2022 CLINICAL DATA:  Fall 1 week ago.  Complete right leg and hip pain. EXAM: DG HIP (WITH OR WITHOUT PELVIS) 2-3V RIGHT; RIGHT FEMUR 2 VIEWS; RIGHT TIBIA AND FIBULA - 2 VIEW COMPARISON:  Right knee radiographs 07/11/2020 and CT abdomen and pelvis 05/12/2018 FINDINGS: Pelvis right hip and right femur: Mild bilateral superior femoroacetabular joint space narrowing. Mild bilateral sacroiliac subchondral sclerosis. High-grade stool is seen within the rectum. There is diffuse decreased bone mineralization. This limits evaluation for an acute nondisplaced fracture. There is moderate chronic enthesopathic change at the ischial tuberosity common hamstring tendon origin, similar to prior CT 05/11/2018. No definitive acute fracture line is seen. Right tibia and fibula: Diffuse decreased bone mineralization. No definite acute fracture line is seen. IMPRESSION: Diffuse decreased bone mineralization limits evaluation for an acute nondisplaced fracture. No definite fracture is seen within the pelvis, right hip, right femur, or right tibia/fibula. Electronically Signed   By: Yvonne Kendall M.D.   On: 06/04/2022 16:00   DG Ankle Complete Right  Result Date: 06/04/2022 CLINICAL DATA:  Fall 1 week ago. Right ankle and foot pain and swelling. EXAM: RIGHT ANKLE - COMPLETE 3+ VIEW; RIGHT FOOT COMPLETE - 3+ VIEW COMPARISON:  Right foot radiographs 12/12/2009 FINDINGS: Right ankle: There is diffuse decreased bone mineralization. Moderate distal medial malleolar degenerative osteophytes. Moderate  posterior tibiotalar joint space narrowing on lateral view. Mild-to-moderate plantar calcaneal heel spur. No definite acute fracture is seen. No dislocation. Moderate lateral malleolar soft tissue swelling. Right foot: Diffuse decreased bone mineralization. Mild-to-moderate  joint space narrowing of the interphalangeal joints diffusely. Moderate to high-grade soft tissue swelling dorsal to the distal metatarsals on lateral view. No definite acute fracture is seen. No dislocation. IMPRESSION: Diffuse decreased bone mineralization limits evaluation for an acute nondisplaced fracture. No definite acute fracture is seen, however there is high-grade soft tissue swelling at the dorsal lateral aspect of the foot at the level of the distal metatarsals. Mild-to-moderate plantar calcaneal heel spur. Electronically Signed   By: Yvonne Kendall M.D.   On: 06/04/2022 15:25   DG Foot Complete Right  Result Date: 06/04/2022 CLINICAL DATA:  Fall 1 week ago. Right ankle and foot pain and swelling. EXAM: RIGHT ANKLE - COMPLETE 3+ VIEW; RIGHT FOOT COMPLETE - 3+ VIEW COMPARISON:  Right foot radiographs 12/12/2009 FINDINGS: Right ankle: There is diffuse decreased bone mineralization. Moderate distal medial malleolar degenerative osteophytes. Moderate posterior tibiotalar joint space narrowing on lateral view. Mild-to-moderate plantar calcaneal heel spur. No definite acute fracture is seen. No dislocation. Moderate lateral malleolar soft tissue swelling. Right foot: Diffuse decreased bone mineralization. Mild-to-moderate joint space narrowing of the interphalangeal joints diffusely. Moderate to high-grade soft tissue swelling dorsal to the distal metatarsals on lateral view. No definite acute fracture is seen. No dislocation. IMPRESSION: Diffuse decreased bone mineralization limits evaluation for an acute nondisplaced fracture. No definite acute fracture is seen, however there is high-grade soft tissue swelling at the dorsal lateral  aspect of the foot at the level of the distal metatarsals. Mild-to-moderate plantar calcaneal heel spur. Electronically Signed   By: Yvonne Kendall M.D.   On: 06/04/2022 15:25    Procedures Procedures    Medications Ordered in ED Medications - No data to display  ED Course/ Medical Decision Making/ A&P                           Medical Decision Making Problems Addressed: Cellulitis of right lower leg: acute illness or injury Fall from slip, trip, or stumble, initial encounter: acute illness or injury with systemic symptoms that poses a threat to life or bodily functions Moderate right ankle sprain, initial encounter: acute illness or injury Pain in extremity at multiple sites: acute illness or injury with systemic symptoms Physical deconditioning: acute illness or injury with systemic symptoms that poses a threat to life or bodily functions  Amount and/or Complexity of Data Reviewed Independent Historian: EMS External Data Reviewed: notes. Labs: ordered. Decision-making details documented in ED Course. Radiology: ordered and independent interpretation performed. Decision-making details documented in ED Course.  Risk Prescription drug management. Decision regarding hospitalization.   Iv ns. Continuous pulse ox and cardiac monitoring. Labs ordered/sent. Imaging ordered.   Diff dx includes hip/knee/ankle fx - dispo decision including potential need for admission if hip/femur fx - will get imaging and reassess.   Reviewed nursing notes and prior charts for additional history. External reports reviewed.   Initial imaging ordered by staff. After my eval, additional imaging ordered as pt with difficulty localizing pain/area of most severe pain, w multiple areas of pain/tenderness.   Cardiac monitor: sinus rhythm, rate 84.  Labs reviewed/interpreted by me - wbc normal. Chem normal.   Xrays reviewed/interpreted by me - no def fx. Marked osteopenia limits quality of imaging.    Vascular u/s neg for dvt.   Ultram po.   On recheck pt notes persistent tenderness/pain to right knee and right ankle/foot. Foot/ankle is quite swollen.  Will get ct imaging r/o occult fx.   CT reviewed/interpreted  by me - no fx.   Brace/support to ankle/ice/elevate.   Right lower leg with some scaling skin, and mild increased warmth/mild erythema - ?mild/early cellulitis. Although there is swelling of extremity, esp foot/ankle, there is not dvt, and compartments of leg/soft, not tense. Intact distal pulses. No 'pain out of proportion'. Pt comfortable.   Pt currently appears stable for d/c.   Will make home health referral for additional help at home.   Rec pcp f/u.           Final Clinical Impression(s) / ED Diagnoses Final diagnoses:  None    Rx / DC Orders ED Discharge Orders     None         Lajean Saver, MD 06/04/22 2008

## 2022-06-07 NOTE — ED Notes (Signed)
Pt called with concerns about medication, spoke with her and her brother via phone with her permission. Clarified orders for antibiotics. They understand to take as prescribed until finished.

## 2022-06-09 ENCOUNTER — Encounter (HOSPITAL_BASED_OUTPATIENT_CLINIC_OR_DEPARTMENT_OTHER): Payer: Self-pay | Admitting: Emergency Medicine

## 2022-06-09 ENCOUNTER — Other Ambulatory Visit: Payer: Self-pay

## 2022-06-09 ENCOUNTER — Emergency Department (HOSPITAL_BASED_OUTPATIENT_CLINIC_OR_DEPARTMENT_OTHER)
Admission: EM | Admit: 2022-06-09 | Discharge: 2022-06-09 | Disposition: A | Discharge status: Home / Self Care | Payer: Medicare Other | Attending: Emergency Medicine | Admitting: Emergency Medicine

## 2022-06-09 DIAGNOSIS — I10 Essential (primary) hypertension: Secondary | ICD-10-CM | POA: Insufficient documentation

## 2022-06-09 DIAGNOSIS — Z7982 Long term (current) use of aspirin: Secondary | ICD-10-CM | POA: Insufficient documentation

## 2022-06-09 DIAGNOSIS — Z86718 Personal history of other venous thrombosis and embolism: Secondary | ICD-10-CM | POA: Diagnosis not present

## 2022-06-09 DIAGNOSIS — Z7901 Long term (current) use of anticoagulants: Secondary | ICD-10-CM | POA: Diagnosis not present

## 2022-06-09 DIAGNOSIS — D649 Anemia, unspecified: Secondary | ICD-10-CM | POA: Diagnosis not present

## 2022-06-09 DIAGNOSIS — M7989 Other specified soft tissue disorders: Secondary | ICD-10-CM | POA: Insufficient documentation

## 2022-06-09 DIAGNOSIS — E876 Hypokalemia: Secondary | ICD-10-CM | POA: Diagnosis not present

## 2022-06-09 DIAGNOSIS — R2241 Localized swelling, mass and lump, right lower limb: Secondary | ICD-10-CM | POA: Diagnosis present

## 2022-06-09 DIAGNOSIS — Z85841 Personal history of malignant neoplasm of brain: Secondary | ICD-10-CM | POA: Diagnosis not present

## 2022-06-09 LAB — BASIC METABOLIC PANEL
Anion gap: 8 (ref 5–15)
BUN: 11 mg/dL (ref 8–23)
CO2: 29 mmol/L (ref 22–32)
Calcium: 9.9 mg/dL (ref 8.9–10.3)
Chloride: 101 mmol/L (ref 98–111)
Creatinine, Ser: 0.41 mg/dL — ABNORMAL LOW (ref 0.44–1.00)
GFR, Estimated: >60 mL/min (ref >60)
Glucose, Bld: 104 mg/dL — ABNORMAL HIGH (ref 70–99)
Potassium: 3.3 mmol/L — ABNORMAL LOW (ref 3.5–5.1)
Sodium: 138 mmol/L (ref 135–145)

## 2022-06-09 LAB — CBC WITH DIFFERENTIAL/PLATELET
Abs Immature Granulocytes: 0.01 10*3/uL (ref 0.00–0.07)
Basophils Absolute: 0 10*3/uL (ref 0.0–0.1)
Basophils Relative: 0 %
Eosinophils Absolute: 0.1 10*3/uL (ref 0.0–0.5)
Eosinophils Relative: 2 %
HCT: 38.6 % (ref 36.0–46.0)
Hemoglobin: 11.8 g/dL — ABNORMAL LOW (ref 12.0–15.0)
Immature Granulocytes: 0 %
Lymphocytes Relative: 23 %
Lymphs Abs: 1.5 10*3/uL (ref 0.7–4.0)
MCH: 24.9 pg — ABNORMAL LOW (ref 26.0–34.0)
MCHC: 30.6 g/dL (ref 30.0–36.0)
MCV: 81.6 fL (ref 80.0–100.0)
Monocytes Absolute: 0.5 10*3/uL (ref 0.1–1.0)
Monocytes Relative: 7 %
Neutro Abs: 4.6 10*3/uL (ref 1.7–7.7)
Neutrophils Relative %: 68 %
Platelets: 389 10*3/uL (ref 150–400)
RBC: 4.73 MIL/uL (ref 3.87–5.11)
RDW: 16.3 % — ABNORMAL HIGH (ref 11.5–15.5)
WBC: 6.7 10*3/uL (ref 4.0–10.5)
nRBC: 0 % (ref 0.0–0.2)

## 2022-06-09 MED ORDER — POTASSIUM CHLORIDE 20 MEQ PO PACK
40.0000 meq | PACK | Freq: Once | ORAL | Status: AC
Start: 2022-06-09 — End: 2022-06-09
  Administered 2022-06-09: 40 meq via ORAL
  Filled 2022-06-09: qty 2

## 2022-06-09 MED ORDER — POTASSIUM CHLORIDE CRYS ER 20 MEQ PO TBCR
20.0000 meq | EXTENDED_RELEASE_TABLET | Freq: Every day | ORAL | 0 refills | Status: AC
Start: 2022-06-09 — End: 2022-06-23

## 2022-06-09 MED ORDER — FUROSEMIDE 10 MG/ML IJ SOLN
20.0000 mg | Freq: Once | INTRAMUSCULAR | Status: AC
  Administered 2022-06-09: 20 mg via INTRAVENOUS
  Filled 2022-06-09: qty 2

## 2022-06-09 NOTE — ED Provider Notes (Signed)
[facility] EMERGENCY DEPT Provider Note  CSN: [id number] Arrival date & time: 06/09/22 0211  Chief Complaint(s) Leg Swelling  HPI Dana Lang is a 66 y.o. 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  Patient was reportedly taken out of the skilled nursing facility 3 weeks ago.  Unsure if home health and physical therapy were coordinated for the patient.  She has been tolerating all her medications including Lasix for her edema.  5 days ago, patient had a fall and was evaluated at [facility] long the emergency department and had negative work-up including images as well as a negative right lower extremity DVT study.  The history is provided by the patient and a relative.    Past Medical History Past Medical History:  Diagnosis Date   Anemia    Arthritis    DVT (deep venous thrombosis) (HCC)    High cholesterol    History of bleeding ulcers 1995   did not require blood transfusion. Dr Henrene Pastor could not find in patient records.   History of blood transfusion    Hyperlipidemia    Hypertension    Osteopenia    Osteopenia    Panic attacks    PTSD (post-traumatic stress disorder)    Schizophrenia ([place])    per daughter   Seasonal allergies    Patient Active Problem List   Diagnosis Date Noted   Hyperlipidemia 09/13/2021   Osteoarthritis of knee 09/13/2021   Gastric ulcer 09/13/2021   Depression 09/13/2021   Cholelithiasis 09/13/2021   Constipation 09/13/2021   Carpal tunnel syndrome 09/13/2021   Anxiety state 09/13/2021   Leukocytosis    Pressure injury of skin 01/29/2021   S/P craniotomy 01/28/2021   Meningioma ([place]) 01/28/2021   Essential hypertension 10/30/2020   Chest pain 07/23/2015   Right arm pain 07/23/2015   Costochondritis, acute 07/23/2015   Pre-syncope  07/23/2015   Tobacco abuse 07/23/2015   Atypical chest pain 07/23/2015   Pain    Home Medication(s) Prior to Admission medications   Medication Sig Start Date End Date Taking? Authorizing Provider  potassium chloride SA (KLOR-CON M) 20 MEQ tablet Take 1 tablet (20 mEq total) by mouth daily for 14 days. 06/09/22 06/23/22 Yes Raisha Brabender, Grayce Sessions, MD  alendronate (FOSAMAX) 70 MG tablet Take 70 mg by mouth once a week. 12/18/20   [provider]  Ascorbic Acid (VITAMIN C PO) Take 1,000 mg by mouth daily.     [provider]  aspirin 81 MG tablet Take 81 mg by mouth daily.    [provider]  b complex vitamins capsule Take 1 capsule by mouth daily.    [provider]  Calcium Carb-Cholecalciferol (CALCIUM 1000 + D PO) Take 1,000 mg by mouth daily.    [provider]  cephALEXin (KEFLEX) 500 MG capsule Take 1 capsule (500 mg total) by mouth 3 (three) times daily. 06/04/22   Lajean Saver, MD  cetirizine (ZYRTEC) 10 MG tablet Take 10 mg by mouth daily as needed for allergies.    [provider]  Cholecalciferol (VITAMIN D) 50 MCG (2000 UT) tablet Take 2,000 Units by mouth daily.    [provider]  EZALLOR SPRINKLE 10 MG CPSP Take 1 capsule by mouth daily. 11/20/21   [provider]  ferrous sulfate 325 (65 FE) MG tablet Take 325 mg by mouth  every other day.    [provider]  hydrochlorothiazide (HYDRODIURIL) 25 MG tablet Take 25 mg by mouth daily.    [provider]  Multiple Vitamin (MULTIVITAMIN) capsule Take 1 capsule by mouth daily.    [provider]  ondansetron (ZOFRAN) 4 MG tablet Take 1 tablet (4 mg total) by mouth every 6 (six) hours as needed for nausea. 09/14/21   Raiford Noble Latif, DO  PRESCRIPTION MEDICATION Inject into the skin every 30 (thirty) days. Hydrocortisone injection    [provider]  rosuvastatin (CRESTOR) 10 MG tablet Take 10 mg by mouth at bedtime. 01/06/21    [provider]  sertraline (ZOLOFT) 25 MG tablet Take 25 mg by mouth daily. 10/29/21   [provider]  XARELTO 20 MG TABS tablet Take 20 mg by mouth daily. 11/20/21   [provider]                                                                                                                                    Allergies Codeine  Review of Systems Review of Systems As noted in HPI  Physical Exam Vital Signs  I have reviewed the triage vital signs BP (!) 114/58   Pulse 87   Temp 99.1 F (37.3 C) (Oral)   Resp 18   SpO2 95%   Physical Exam Vitals reviewed.  Constitutional:      General: She is not in acute distress.    Appearance: She is well-developed. She is not diaphoretic.  HENT:     Head: Normocephalic and atraumatic.     Right Ear: External ear normal.     Left Ear: External ear normal.     Nose: Nose normal.  Eyes:     General: No scleral icterus.    Conjunctiva/sclera: Conjunctivae normal.  Neck:     Trachea: Phonation normal.  Cardiovascular:     Rate and Rhythm: Normal rate and regular rhythm.  Pulmonary:     Effort: Pulmonary effort is normal. No respiratory distress.     Breath sounds: No stridor.  Abdominal:     General: There is no distension.  Musculoskeletal:        General: Normal range of motion.     Cervical back: Normal range of motion.     Right lower leg: Swelling present. 1+ Edema present.     Left lower leg: Swelling present. Edema present.  Neurological:     Mental Status: She is alert and oriented to person, place, and time.     Comments: 5/5 BUE, 4/5 LLE, 1/5 RLE strength  Psychiatric:        Behavior: Behavior normal.     ED Results and Treatments Labs (all labs ordered are listed, but only abnormal results are displayed) Labs Reviewed  CBC WITH DIFFERENTIAL/PLATELET - Abnormal; Notable for the following components:      Result Value   Hemoglobin 11.8 (*)  MCH 24.9 (*)    RDW 16.3 (*)    All other  components within normal limits  BASIC METABOLIC PANEL - Abnormal; Notable for the following components:   Potassium 3.3 (*)    Glucose, Bld 104 (*)    Creatinine, Ser 0.41 (*)    All other components within normal limits                                                                                                                         EKG  EKG Interpretation  Date/Time:    Ventricular Rate:    PR Interval:    QRS Duration:   QT Interval:    QTC Calculation:   R Axis:     Text Interpretation:         Radiology No results found.  Medications Ordered in ED Medications  furosemide (LASIX) injection 20 mg (20 mg Intravenous Given 06/09/22 0526)  potassium chloride (KLOR-CON) packet 40 mEq (40 mEq Oral Given 06/09/22 0525)                                                                                                                                     Procedures Procedures  (including critical care time)  Medical Decision Making / ED Course   Medical Decision Making Amount and/or Complexity of Data Reviewed External Data Reviewed: radiology.    Details: noted in HPI Labs: ordered. Decision-making details documented in ED Course.  Risk Prescription drug management.    Right lower extremity swelling. Right greater than left edema. No cellulitis concerning for infection Recent DVT ultrasound was negative, doubt recurrence of her DVT.Marland Kitchen Patient does not had any physical therapy at home which is likely the reason for her increased swelling.  We will obtain screening labs to assess for any electrolyte derangements or renal insufficiency.  CBC without leukocytosis.  Mild anemia. Metabolic panel with mild hypokalemia.  No renal sufficiency.  IV dose of Lasix was provided Potassium was repleted orally.  I contacted the patient's first point of contact which is her daughter.  She confirmed patient's history of going home 3 weeks ago from skilled nursing facility due  to personal choice.  She is unsure of the status of home health for physical therapy.  We will place [facility] consult and face-to-face.  Attempted to contact patient's brother who she lives with but unsuccessful.     Final  Clinical Impression(s) / ED Diagnoses Final diagnoses:  Right leg swelling  Hypokalemia   The patient appears reasonably screened and/or stabilized for discharge and I doubt any other medical condition or other [facility] requiring further screening, evaluation, or treatment in the ED at this time. I have discussed the findings, Dx and Tx plan with the patient/family who expressed understanding and agree(s) with the plan. Discharge instructions discussed at length. The patient/family was given strict return precautions who verbalized understanding of the instructions. No further questions at time of discharge.  Disposition: Discharge  Condition: Good  ED Discharge Orders          Ordered    potassium chloride SA Rhetta Mura) 20 MEQ tablet  Daily        06/09/22 0639             Follow Up: Center, [facility] [place] [address] [phone]  Call  to schedule an appointment for close follow up           This chart was dictated using voice recognition software.  Despite best efforts to proofread,  errors can occur which can change the documentation meaning.    Fatima Blank, MD 06/09/22 [phone]

## 2022-06-09 NOTE — ED Notes (Signed)
Dc instructions reviewed with patient. Patient voiced understanding. Dc with belongings.  °

## 2022-06-09 NOTE — ED Notes (Signed)
Discussed with patient s daughter plan of d/c home to patients home and her brother Dominica Severin would be there. He lives with  her and is her caretaker, Dominica Severin called and is at the home. Daughter asked about home health and referred her to the patients primary.

## 2022-06-09 NOTE — ED Triage Notes (Signed)
BIB ems for right leg swelling for last 2 month. Currently on lasix daily, unsure what dose. Right leg is painful to touch. Non ambulatory.  EMS VS: Bp 120/80 107 spo2 95% RA  place on 2l [place] to 97% by ems 97.3T

## 2022-06-09 NOTE — Discharge Instructions (Addendum)
Patient needs physical therapy at home which will help decrease the amount of swelling in her legs.  If she is unable to get physical therapy at home, it may be beneficial to place patient in a skilled nursing facility.  During the work-up we noted that the patient's potassium was slightly low which can occur by taking diuretic such as furosemide (Lasix).  I will prescribe her short course of potassium supplements.  She needs to follow-up with her primary care provider to recheck her potassium levels.

## 2022-06-09 NOTE — ED Notes (Signed)
Called [facility] for transportation at 643am, patient is next on list.

## 2022-07-08 ENCOUNTER — Other Ambulatory Visit: Payer: Self-pay | Admitting: *Deleted

## 2022-07-08 DIAGNOSIS — M7989 Other specified soft tissue disorders: Secondary | ICD-10-CM

## 2022-07-14 ENCOUNTER — Ambulatory Visit (INDEPENDENT_AMBULATORY_CARE_PROVIDER_SITE_OTHER): Payer: Medicare Other | Admitting: Vascular Surgery

## 2022-07-14 ENCOUNTER — Ambulatory Visit (HOSPITAL_COMMUNITY): Payer: Medicare Other

## 2022-07-14 ENCOUNTER — Ambulatory Visit: Payer: Medicare Other | Admitting: Vascular Surgery

## 2022-07-14 ENCOUNTER — Encounter: Payer: Self-pay | Admitting: Vascular Surgery

## 2022-07-14 VITALS — BP 139/81 | HR 116 | Temp 98.1°F | Resp 20 | Ht 63.0 in

## 2022-07-14 DIAGNOSIS — M7989 Other specified soft tissue disorders: Secondary | ICD-10-CM

## 2022-07-14 NOTE — Progress Notes (Signed)
Patient ID: Dana Lang, female   DOB: Apr 18, 1956, 66 y.o.   MRN: [id number]  Reason for Consult: Follow-up   Referred by [facility]  Subjective:     HPI:  Dana Lang is a 66 y.o. 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  She is confined to a wheelchair she is mostly at home she is in the bed.  She has been seen at the lymphedema clinic for potential swelling in the right lower extremity even had drainage with sounds like fluid bubble right foot at the [place] in [place] recently.  She does not wear compression stockings.  She really complains of 10 out of 10 pain throughout her entire body possibly in her legs and has extreme sense activity to any palpation.  She previously was in a nursing home but now is at home.  Past Medical History:  Diagnosis Date   Anemia    Arthritis    DVT (deep venous thrombosis) (HCC)    High cholesterol    History of bleeding ulcers 1995   did not require blood transfusion. Dr Henrene Pastor could not find in patient records.   History of blood transfusion    Hyperlipidemia    Hypertension    Osteopenia    Osteopenia    Panic attacks    PTSD (post-traumatic stress disorder)    Schizophrenia ([place])    per daughter   Seasonal allergies    Family History  Problem Relation Age of Onset   Esophageal cancer Mother    Heart attack Father 65   Past Surgical History:  Procedure Laterality Date   ABDOMINAL HYSTERECTOMY     APPENDECTOMY     APPLICATION OF CRANIAL NAVIGATION N/A 01/28/2021   Procedure: APPLICATION OF CRANIAL NAVIGATION;  Surgeon: Ashok Pall, MD;  Location: [facility];  Service: Neurosurgery;  Laterality: N/A;   CRANIOTOMY Left 01/28/2021   Procedure: 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;  Surgeon: Ashok Pall, MD;  Location: [facility];  Service: Neurosurgery;  Laterality: Left;   Dog bite  1970's   cosmetic surgery   TUBAL LIGATION      Short Social History:   Social History   Tobacco Use   Smoking status: Former    Packs/day: 0.25    Years: 40.00    Total pack years: 10.00    Types: Cigarettes    Quit date: 12/2018    Years since quitting: 3.5   Smokeless tobacco: Never  Substance Use Topics   Alcohol use: No    Allergies  Allergen Reactions   Codeine Nausea And Vomiting    Current Outpatient Medications  Medication Sig Dispense Refill   alendronate (FOSAMAX) 70 MG tablet Take 70 mg by mouth once a week.     Ascorbic Acid (VITAMIN C PO) Take 1,000 mg by mouth daily.      aspirin 81 MG tablet Take 81 mg by mouth daily.     b complex vitamins capsule Take 1 capsule by mouth daily.     Calcium Carb-Cholecalciferol (CALCIUM 1000 + D PO) Take 1,000 mg by mouth daily.     cephALEXin (KEFLEX) 500 MG capsule Take 1 capsule (500 mg total) by mouth 3 (three) times daily. 20 capsule 0   cetirizine (ZYRTEC) 10 MG tablet Take 10 mg by mouth daily as needed for allergies.     Cholecalciferol (VITAMIN D) 50 MCG (2000 UT) tablet Take 2,000 Units by mouth daily.  EZALLOR SPRINKLE 10 MG CPSP Take 1 capsule by mouth daily.     ferrous sulfate 325 (65 FE) MG tablet Take 325 mg by mouth every other day.     hydrochlorothiazide (HYDRODIURIL) 25 MG tablet Take 25 mg by mouth daily.     Multiple Vitamin (MULTIVITAMIN) capsule Take 1 capsule by mouth daily.     ondansetron (ZOFRAN) 4 MG tablet Take 1 tablet (4 mg total) by mouth every 6 (six) hours as needed for nausea. 20 tablet 0   PRESCRIPTION MEDICATION Inject into the skin every 30 (thirty) days. Hydrocortisone injection     rosuvastatin (CRESTOR) 10 MG tablet Take 10 mg by mouth at bedtime.     sertraline (ZOLOFT) 25 MG tablet Take 25 mg by mouth daily.     XARELTO 20 MG TABS tablet Take 20 mg by mouth daily.     potassium chloride SA (KLOR-CON M) 20 MEQ tablet Take 1 tablet (20 mEq total) by mouth daily for 14 days. 14 tablet 0   No current facility-administered medications for this visit.     Review of Systems  Constitutional:  Constitutional negative. HENT: HENT negative.  Eyes: Eyes negative.  Respiratory: Respiratory negative.  Cardiovascular: Positive for leg swelling.  GI: Gastrointestinal negative.  Musculoskeletal: Positive for leg pain.  Hematologic: Hematologic/lymphatic negative.  Psychiatric: Psychiatric negative.        Objective:  Objective   Vitals:   07/14/22 1124  BP: 139/81  Pulse: (!) 116  Resp: 20  Temp: 98.1 F (36.7 C)  SpO2: 94%  Height: '5\' 3"'$  (1.6 m)   Body mass index is 29.94 kg/m.  Physical Exam HENT:     Nose: Nose normal.     Mouth/Throat:     Mouth: Mucous membranes are moist.  Eyes:     Pupils: Pupils are equal, round, and reactive to light.  Cardiovascular:     Pulses: Normal pulses.  Pulmonary:     Effort: Pulmonary effort is normal.  Abdominal:     General: Abdomen is flat.  Musculoskeletal:     Right lower leg: Edema present.     Left lower leg: Edema present.  Skin:    Capillary Refill: Capillary refill takes less than 2 seconds.     Comments: Purple discoloration of her feet  Neurological:     General: No focal deficit present.     Mental Status: She is alert.  Psychiatric:        Mood and Affect: Mood normal.     Data: RIGHT    CompressibilityPhasicitySpontaneityPropertiesThrombus Aging        +---------+---------------+---------+-----------+----------+---------------  ----+  CFV      Full           Yes      Yes                                        +---------+---------------+---------+-----------+----------+---------------  ----+  SFJ      Full                                                               +---------+---------------+---------+-----------+----------+---------------  ----+  FV Prox  Full                                                               +---------+---------------+---------+-----------+----------+---------------  ----+  FV Mid                   Yes      Yes                                        +---------+---------------+---------+-----------+----------+---------------  ----+  FV Distal               Yes      Yes                                        +---------+---------------+---------+-----------+----------+---------------  ----+  PFV      Full                                                               +---------+---------------+---------+-----------+----------+---------------  ----+  POP      Full           Yes      Yes                                        +---------+---------------+---------+-----------+----------+---------------  ----+  PTV      Full                                                               +---------+---------------+---------+-----------+----------+---------------  ----+  PERO                                                  Not well  visualized  +---------+---------------+---------+-----------+----------+---------------  ----+           +----+---------------+---------+-----------+----------+--------------+  LEFTCompressibilityPhasicitySpontaneityPropertiesThrombus Aging  +----+---------------+---------+-----------+----------+--------------+  CFV Full           Yes      Yes                                  +----+---------------+---------+-----------+----------+--------------+              Summary:  RIGHT:  - There is no evidence of deep vein thrombosis in the lower extremity.  However, portions of this examination were limited- see technologist  comments above.     - No cystic structure found in the popliteal fossa.     LEFT:  - No evidence of common femoral vein obstruction.         Assessment/Plan:    66 year old female here with bilateral lower extremity swelling which has been present for many years and I have seen her on 2 previous occasions.  As the patient is bedbound most of the time she does not have  any vascular  intervention.  We have fitted her for knee-high compression stockings and I urged her to actually wear them this time.    Unless she has a limb threatening issue in the future she does not need to be reevaluated by vascular surgery.     Waynetta Sandy MD [facility]

## 2022-07-20 ENCOUNTER — Ambulatory Visit: Payer: Medicare Other | Admitting: Occupational Therapy

## 2022-07-21 DIAGNOSIS — I82503 Chronic embolism and thrombosis of unspecified deep veins of lower extremity, bilateral: Secondary | ICD-10-CM | POA: Diagnosis not present

## 2022-08-06 ENCOUNTER — Ambulatory Visit: Payer: Medicare Other | Admitting: Family Medicine

## 2022-08-30 ENCOUNTER — Ambulatory Visit: Payer: Medicare Other | Admitting: Occupational Therapy

## 2022-10-04 DIAGNOSIS — Z780 Asymptomatic menopausal state: Secondary | ICD-10-CM | POA: Diagnosis not present

## 2022-10-04 DIAGNOSIS — M8588 Other specified disorders of bone density and structure, other site: Secondary | ICD-10-CM | POA: Diagnosis not present

## 2022-10-04 DIAGNOSIS — R2241 Localized swelling, mass and lump, right lower limb: Secondary | ICD-10-CM | POA: Diagnosis not present

## 2022-10-04 DIAGNOSIS — M810 Age-related osteoporosis without current pathological fracture: Secondary | ICD-10-CM | POA: Diagnosis not present

## 2022-10-04 DIAGNOSIS — Z13820 Encounter for screening for osteoporosis: Secondary | ICD-10-CM | POA: Diagnosis not present

## 2022-11-04 ENCOUNTER — Telehealth: Payer: Self-pay | Admitting: General Practice

## 2022-11-04 NOTE — Telephone Encounter (Signed)
Dominica Severin , pt's brother, called in to inquire if we had any sooner appointments than 11/15/22. I advised him unfortunately we do not.

## 2022-11-04 NOTE — Telephone Encounter (Signed)
Pt brother want you to call him gary ziegler [phone]

## 2022-11-04 NOTE — Telephone Encounter (Signed)
Caller Name: Mora Appl  Call back phone #: [phone]  Reason for Call: Please call pt's brother. He is a pt here and sched this appt for his sister when leaving his appt. Since she is a new patient it was 2 weeks out 1/22 before we could schedule her. He is concerned that she can not wait that long. Please call pt to advise whether he should go to hospital.

## 2022-11-15 ENCOUNTER — Telehealth: Payer: Self-pay | Admitting: Family Medicine

## 2022-11-15 ENCOUNTER — Ambulatory Visit: Payer: 59 | Admitting: Family Medicine

## 2022-11-15 NOTE — Telephone Encounter (Signed)
1.22.24 no show letter sent

## 2022-11-16 NOTE — Telephone Encounter (Signed)
2nd no show for new patient visit - dismissed from [facility] via [employer]

## 2022-12-23 NOTE — Telephone Encounter (Signed)
Error

## 2023-04-18 DIAGNOSIS — Z86718 Personal history of other venous thrombosis and embolism: Secondary | ICD-10-CM | POA: Diagnosis not present

## 2023-06-23 ENCOUNTER — Emergency Department (HOSPITAL_COMMUNITY): Payer: No Typology Code available for payment source

## 2023-06-23 ENCOUNTER — Emergency Department (HOSPITAL_COMMUNITY)
Admission: EM | Admit: 2023-06-23 | Discharge: 2023-06-23 | Disposition: A | Discharge status: Home / Self Care | Payer: No Typology Code available for payment source | Attending: Emergency Medicine | Admitting: Emergency Medicine

## 2023-06-23 ENCOUNTER — Encounter (HOSPITAL_COMMUNITY): Payer: Self-pay

## 2023-06-23 ENCOUNTER — Other Ambulatory Visit: Payer: Self-pay

## 2023-06-23 DIAGNOSIS — R000 Tachycardia, unspecified: Secondary | ICD-10-CM | POA: Insufficient documentation

## 2023-06-23 DIAGNOSIS — R059 Cough, unspecified: Secondary | ICD-10-CM | POA: Diagnosis not present

## 2023-06-23 DIAGNOSIS — R531 Weakness: Secondary | ICD-10-CM | POA: Diagnosis not present

## 2023-06-23 DIAGNOSIS — Z79899 Other long term (current) drug therapy: Secondary | ICD-10-CM | POA: Diagnosis not present

## 2023-06-23 DIAGNOSIS — I499 Cardiac arrhythmia, unspecified: Secondary | ICD-10-CM | POA: Diagnosis not present

## 2023-06-23 DIAGNOSIS — R197 Diarrhea, unspecified: Secondary | ICD-10-CM | POA: Diagnosis not present

## 2023-06-23 DIAGNOSIS — U071 COVID-19: Secondary | ICD-10-CM | POA: Diagnosis not present

## 2023-06-23 DIAGNOSIS — Z7901 Long term (current) use of anticoagulants: Secondary | ICD-10-CM | POA: Diagnosis not present

## 2023-06-23 DIAGNOSIS — I10 Essential (primary) hypertension: Secondary | ICD-10-CM | POA: Diagnosis not present

## 2023-06-23 DIAGNOSIS — R062 Wheezing: Secondary | ICD-10-CM | POA: Diagnosis not present

## 2023-06-23 DIAGNOSIS — Z7982 Long term (current) use of aspirin: Secondary | ICD-10-CM | POA: Insufficient documentation

## 2023-06-23 DIAGNOSIS — Z743 Need for continuous supervision: Secondary | ICD-10-CM | POA: Diagnosis not present

## 2023-06-23 DIAGNOSIS — R0989 Other specified symptoms and signs involving the circulatory and respiratory systems: Secondary | ICD-10-CM | POA: Diagnosis not present

## 2023-06-23 LAB — RESP PANEL BY RT-PCR (RSV, FLU A&B, COVID)  RVPGX2
Influenza A by PCR: NEGATIVE
Influenza B by PCR: NEGATIVE
Resp Syncytial Virus by PCR: NEGATIVE
SARS Coronavirus 2 by RT PCR: POSITIVE — AB

## 2023-06-23 MED ORDER — ALBUTEROL SULFATE HFA 108 (90 BASE) MCG/ACT IN AERS: 1.0000 | INHALATION_SPRAY | RESPIRATORY_TRACT | 0 refills | Status: AC | PRN

## 2023-06-23 MED ORDER — ACETAMINOPHEN 325 MG PO TABS
650.0000 mg | ORAL_TABLET | Freq: Once | ORAL | Status: AC
  Administered 2023-06-23: 650 mg via ORAL
  Filled 2023-06-23: qty 2

## 2023-06-23 MED ORDER — IPRATROPIUM-ALBUTEROL 0.5-2.5 (3) MG/3ML IN SOLN
3.0000 mL | Freq: Once | RESPIRATORY_TRACT | Status: AC
  Administered 2023-06-23: 3 mL via RESPIRATORY_TRACT
  Filled 2023-06-23: qty 3

## 2023-06-23 MED ORDER — ALBUTEROL SULFATE HFA 108 (90 BASE) MCG/ACT IN AERS: 6.0000 | INHALATION_SPRAY | Freq: Once | RESPIRATORY_TRACT | Status: DC

## 2023-06-23 NOTE — ED Notes (Signed)
[facility] at bedside for transport.  

## 2023-06-23 NOTE — ED Notes (Signed)
[facility] called for tx.

## 2023-06-23 NOTE — ED Provider Notes (Signed)
[place] EMERGENCY DEPARTMENT AT [facility] Provider Note   CSN: [id number] Arrival date & time: 06/23/23  1116     History  Chief Complaint  Patient presents with   Diarrhea   Cough    Dana Lang is a 67 y.o. female.  Patient is a 66 year old female with past medical history of hypertension, hyperlipidemia, bleeding ulcers, schizophrenia, and PTSD presenting for complaints of cough, congestion, and diarrhea x 2 days.  States cough started 2 weeks ago but it has significantly been getting worse.  Admits to new shortness of breath and difficulty breathing.  Close family member recently diagnosed with COVID.  The history is provided by the patient. No language interpreter was used.  Diarrhea Associated symptoms: no abdominal pain, no arthralgias, no chills, no fever and no vomiting   Cough Associated symptoms: shortness of breath   Associated symptoms: no chest pain, no chills, no ear pain, no fever, no rash and no sore throat        Home Medications Prior to Admission medications   Medication Sig Start Date End Date Taking? Authorizing Provider  albuterol (VENTOLIN HFA) 108 (90 Base) MCG/ACT inhaler Inhale 1-2 puffs into the lungs every 4 (four) hours as needed for wheezing or shortness of breath. 06/23/23  Yes Edwin Dada P, DO  alendronate (FOSAMAX) 70 MG tablet Take 70 mg by mouth once a week. 12/18/20   [provider]  Ascorbic Acid (VITAMIN C PO) Take 1,000 mg by mouth daily.     [provider]  aspirin 81 MG tablet Take 81 mg by mouth daily.    [provider]  b complex vitamins capsule Take 1 capsule by mouth daily.    [provider]  Calcium Carb-Cholecalciferol (CALCIUM 1000 + D PO) Take 1,000 mg by mouth daily.    [provider]  cephALEXin (KEFLEX) 500 MG capsule Take 1 capsule (500 mg total) by mouth 3 (three) times daily. 06/04/22   Cathren Laine, MD  cetirizine (ZYRTEC) 10 MG tablet Take 10 mg by  mouth daily as needed for allergies.    [provider]  Cholecalciferol (VITAMIN D) 50 MCG (2000 UT) tablet Take 2,000 Units by mouth daily.    [provider]  EZALLOR SPRINKLE 10 MG CPSP Take 1 capsule by mouth daily. 11/20/21   [provider]  ferrous sulfate 325 (65 FE) MG tablet Take 325 mg by mouth every other day.    [provider]  hydrochlorothiazide (HYDRODIURIL) 25 MG tablet Take 25 mg by mouth daily.    [provider]  Multiple Vitamin (MULTIVITAMIN) capsule Take 1 capsule by mouth daily.    [provider]  ondansetron (ZOFRAN) 4 MG tablet Take 1 tablet (4 mg total) by mouth every 6 (six) hours as needed for nausea. 09/14/21   Marguerita Merles Latif, DO  potassium chloride SA (KLOR-CON M) 20 MEQ tablet Take 1 tablet (20 mEq total) by mouth daily for 14 days. 06/09/22 06/23/22  Nira Conn, MD  PRESCRIPTION MEDICATION Inject into the skin every 30 (thirty) days. Hydrocortisone injection    [provider]  rosuvastatin (CRESTOR) 10 MG tablet Take 10 mg by mouth at bedtime. 01/06/21   [provider]  sertraline (ZOLOFT) 25 MG tablet Take 25 mg by mouth daily. 10/29/21   [provider]  XARELTO 20 MG TABS tablet Take 20 mg by mouth daily. 11/20/21   [provider]      Allergies  Codeine    Review of Systems   Review of Systems  Constitutional:  Negative for chills and fever.  HENT:  Positive for congestion. Negative for ear pain and sore throat.   Eyes:  Negative for pain and visual disturbance.  Respiratory:  Positive for cough and shortness of breath.   Cardiovascular:  Negative for chest pain and palpitations.  Gastrointestinal:  Positive for diarrhea. Negative for abdominal pain and vomiting.  Genitourinary:  Negative for dysuria and hematuria.  Musculoskeletal:  Negative for arthralgias and back pain.  Skin:  Negative for color change and rash.  Neurological:  Negative for  seizures and syncope.  All other systems reviewed and are negative.   Physical Exam Updated Vital Signs BP (!) 133/110   Pulse (!) 102   Temp 99.6 F (37.6 C) (Oral)   Resp 20   Ht 5\' 3"  (1.6 m)   Wt 76.7 kg   SpO2 100%   BMI 29.95 kg/m  Physical Exam Vitals and nursing note reviewed.  Constitutional:      General: She is not in acute distress.    Appearance: She is well-developed.  HENT:     Head: Normocephalic and atraumatic.  Eyes:     Conjunctiva/sclera: Conjunctivae normal.  Cardiovascular:     Rate and Rhythm: Regular rhythm. Tachycardia present.     Heart sounds: No murmur heard. Pulmonary:     Effort: Pulmonary effort is normal. No respiratory distress.     Breath sounds: Normal breath sounds.  Abdominal:     Palpations: Abdomen is soft.     Tenderness: There is no abdominal tenderness.  Musculoskeletal:        General: No swelling.     Cervical back: Neck supple.  Skin:    General: Skin is warm and dry.     Capillary Refill: Capillary refill takes less than 2 seconds.  Neurological:     Mental Status: She is alert.  Psychiatric:        Mood and Affect: Mood normal.     ED Results / Procedures / Treatments   Labs (all labs ordered are listed, but only abnormal results are displayed) Labs Reviewed  RESP PANEL BY RT-PCR (RSV, FLU A&B, COVID)  [id number] - Abnormal; Notable for the following components:      Result Value   SARS Coronavirus 2 by RT PCR POSITIVE (*)    All other components within normal limits    EKG None  Radiology No results found.  Procedures Procedures    Medications Ordered in ED Medications  ipratropium-albuterol (DUONEB) 0.5-2.5 (3) MG/3ML nebulizer solution 3 mL (3 mLs Nebulization Given 06/23/23 1325)  acetaminophen (TYLENOL) tablet 650 mg (650 mg Oral Given 06/23/23 1416)    ED Course/ Medical Decision Making/ A&P                                 Medical Decision Making Amount and/or Complexity of Data  Reviewed Radiology: ordered.  Risk OTC drugs. Prescription drug management.   67 year old female presenting for cough congestion and diarrhea x 2 days with COVID positive family member.  Patient is alert and oriented x 3, no acute distress, tachycardic at 106 with a temperature of 99.6 F otherwise stable vital signs.  Satting at 100% while resting on room air.  Patient COVID-positive. Chest x-ray demonstrates no secondary bacterial pneumonia.  Patient originally presented tachycardic but is now improving after Tylenol for low-grade  temperature and breathing treatment.  Patient safe for discharge home at this time.  No hypoxia.  Adderall sent to pharmacy.  Patient recommended Motrin and Tylenol for fevers or bodyaches.  Recommended for rest and increase vitamin C.  Patient in no distress and overall condition improved here in the ED. Detailed discussions were had with the patient regarding current findings, and need for close f/u with PCP or on call doctor. The patient has been instructed to return immediately if the symptoms worsen in any way for re-evaluation. Patient verbalized understanding and is in agreement with current care plan. All questions answered prior to discharge.         Final Clinical Impression(s) / ED Diagnoses Final diagnoses:  COVID    Rx / DC Orders ED Discharge Orders          Ordered    albuterol (VENTOLIN HFA) 108 (90 Base) MCG/ACT inhaler  Every 4 hours PRN        06/23/23 1423              Franne Forts, DO 06/23/23 1423

## 2023-06-23 NOTE — ED Triage Notes (Signed)
Patient BIB EMS for cough, congestion, and diarrhea x 2 days. Patient's son recently dx with COVID. Patient is bed bound at baseline. [facility] told to come here.

## 2023-07-06 DIAGNOSIS — U071 COVID-19: Secondary | ICD-10-CM | POA: Diagnosis not present

## 2023-07-06 DIAGNOSIS — R059 Cough, unspecified: Secondary | ICD-10-CM | POA: Diagnosis not present

## 2024-06-13 ENCOUNTER — Other Ambulatory Visit: Payer: Self-pay

## 2024-06-13 ENCOUNTER — Emergency Department (HOSPITAL_COMMUNITY)
Admission: EM | Admit: 2024-06-13 | Discharge: 2024-06-13 | Disposition: A | Discharge status: Home / Self Care | Attending: Student | Admitting: Student

## 2024-06-13 ENCOUNTER — Encounter (HOSPITAL_COMMUNITY): Payer: Self-pay

## 2024-06-13 DIAGNOSIS — Z79899 Other long term (current) drug therapy: Secondary | ICD-10-CM | POA: Diagnosis not present

## 2024-06-13 DIAGNOSIS — Z7982 Long term (current) use of aspirin: Secondary | ICD-10-CM | POA: Diagnosis not present

## 2024-06-13 DIAGNOSIS — D649 Anemia, unspecified: Secondary | ICD-10-CM | POA: Insufficient documentation

## 2024-06-13 DIAGNOSIS — R799 Abnormal finding of blood chemistry, unspecified: Secondary | ICD-10-CM | POA: Insufficient documentation

## 2024-06-13 DIAGNOSIS — Z7901 Long term (current) use of anticoagulants: Secondary | ICD-10-CM | POA: Insufficient documentation

## 2024-06-13 DIAGNOSIS — R7889 Finding of other specified substances, not normally found in blood: Secondary | ICD-10-CM | POA: Diagnosis not present

## 2024-06-13 DIAGNOSIS — Z87891 Personal history of nicotine dependence: Secondary | ICD-10-CM | POA: Insufficient documentation

## 2024-06-13 DIAGNOSIS — K922 Gastrointestinal hemorrhage, unspecified: Secondary | ICD-10-CM | POA: Diagnosis not present

## 2024-06-13 DIAGNOSIS — I10 Essential (primary) hypertension: Secondary | ICD-10-CM | POA: Insufficient documentation

## 2024-06-13 DIAGNOSIS — Z7401 Bed confinement status: Secondary | ICD-10-CM | POA: Diagnosis not present

## 2024-06-13 LAB — CBC WITH DIFFERENTIAL/PLATELET
Abs Immature Granulocytes: 0.04 K/uL (ref 0.00–0.07)
Basophils Absolute: 0.1 K/uL (ref 0.0–0.1)
Basophils Relative: 1 %
Eosinophils Absolute: 0.1 K/uL (ref 0.0–0.5)
Eosinophils Relative: 1 %
HCT: 31.7 % — ABNORMAL LOW (ref 36.0–46.0)
Hemoglobin: 9.2 g/dL — ABNORMAL LOW (ref 12.0–15.0)
Immature Granulocytes: 0 %
Lymphocytes Relative: 22 %
Lymphs Abs: 2.4 K/uL (ref 0.7–4.0)
MCH: 23.7 pg — ABNORMAL LOW (ref 26.0–34.0)
MCHC: 29 g/dL — ABNORMAL LOW (ref 30.0–36.0)
MCV: 81.7 fL (ref 80.0–100.0)
Monocytes Absolute: 0.9 K/uL (ref 0.1–1.0)
Monocytes Relative: 9 %
Neutro Abs: 7.4 K/uL (ref 1.7–7.7)
Neutrophils Relative %: 67 %
Platelets: 398 K/uL (ref 150–400)
RBC: 3.88 MIL/uL (ref 3.87–5.11)
RDW: 14.5 % (ref 11.5–15.5)
WBC: 10.9 K/uL — ABNORMAL HIGH (ref 4.0–10.5)
nRBC: 0 % (ref 0.0–0.2)

## 2024-06-13 LAB — COMPREHENSIVE METABOLIC PANEL WITH GFR
ALT: 14 U/L (ref 0–44)
AST: 19 U/L (ref 15–41)
Albumin: 3.2 g/dL — ABNORMAL LOW (ref 3.5–5.0)
Alkaline Phosphatase: 58 U/L (ref 38–126)
Anion gap: 11 (ref 5–15)
BUN: 9 mg/dL (ref 8–23)
CO2: 25 mmol/L (ref 22–32)
Calcium: 9.9 mg/dL (ref 8.9–10.3)
Chloride: 100 mmol/L (ref 98–111)
Creatinine, Ser: 0.63 mg/dL (ref 0.44–1.00)
GFR, Estimated: >60 mL/min (ref >60)
Glucose, Bld: 113 mg/dL — ABNORMAL HIGH (ref 70–99)
Potassium: 3.8 mmol/L (ref 3.5–5.1)
Sodium: 136 mmol/L (ref 135–145)
Total Bilirubin: 0.2 mg/dL (ref 0.0–1.2)
Total Protein: 6.6 g/dL (ref 6.5–8.1)

## 2024-06-13 LAB — POC OCCULT BLOOD, ED: Fecal Occult Bld: POSITIVE — AB

## 2024-06-13 LAB — TYPE AND SCREEN
ABO/RH(D): O POS
Antibody Screen: NEGATIVE

## 2024-06-13 NOTE — ED Triage Notes (Signed)
 Pt arrives to ED via EMS from home after receiving a call from her doctor about a downward trend in her HGB, they are concerned for a bleed. Pt has no complaints today.

## 2024-06-13 NOTE — Discharge Instructions (Addendum)
 Dana Lang was seen in the emergency department for evaluation of a possible GI bleed.  Review of her most recent blood work shows that she had a baseline hemoglobin of 9.8.  Today in the emergency department her hemoglobin is 9.2.  There is no evidence of gross bleeding coming from the rectum here in the emergency department.  Thus she does not need to stay in the hospital right now but she will need an urgent evaluation by her gastroenterology team in the office for an outpatient colonoscopy.  Please make sure that she is continuing to take her iron supplementation as prescribed.  Return to the emergency room and if you are seeing bright red blood coming from the rectum, she has severe fatigue, shortness of breath, chest pain vomiting or any other concerning symptoms.

## 2024-06-14 NOTE — ED Provider Notes (Signed)
 [facility] Provider Note  CSN: [id number] Arrival date & time: 06/13/24 8151  Chief Complaint(s) Abnormal lab value  HPI Dana Lang is a 68 y.o. female with PMH iron deficiency anemia, previous DVT on Xarelto, HTN, HLD, panic attack, PTSD, schizophrenia, meningioma status post craniotomy who presents emerged part for evaluation of abnormal labs.  History obtained from patient's husband who states that they received a call from their nurse coordinator to the [place] who had concern for active GI bleeding and decreased hemoglobin.  They were reportedly told to come to the emergency department for an emergent colonoscopy and CT evaluation.  [facility] show a hemoglobin of 9.8 at the lab of concern.  A home health provider reportedly saw bright red blood per rectum but patient has not seen this.  She denies abdominal pain, nausea, vomiting, chest pain, shortness of breath or other systemic symptoms.   Past Medical History Past Medical History:  Diagnosis Date   Anemia    Arthritis    DVT (deep venous thrombosis) (HCC)    High cholesterol    History of bleeding ulcers 1995   did not require blood transfusion. Dr Abran could not find in patient records.   History of blood transfusion    Hyperlipidemia    Hypertension    Osteopenia    Osteopenia    Panic attacks    PTSD (post-traumatic stress disorder)    Schizophrenia (HCC)    per daughter   Seasonal allergies    Patient Active Problem List   Diagnosis Date Noted   Hyperlipidemia 09/13/2021   Osteoarthritis of knee 09/13/2021   Gastric ulcer 09/13/2021   Depression 09/13/2021   Cholelithiasis 09/13/2021   Constipation 09/13/2021   Carpal tunnel syndrome 09/13/2021   Anxiety state 09/13/2021   Leukocytosis    Pressure injury of skin 01/29/2021   S/P craniotomy 01/28/2021   Meningioma (HCC) 01/28/2021   Essential hypertension 10/30/2020   Chest pain 07/23/2015   Right arm  pain 07/23/2015   Costochondritis, acute 07/23/2015   Pre-syncope 07/23/2015   Tobacco abuse 07/23/2015   Atypical chest pain 07/23/2015   Pain    Home Medication(s) Prior to Admission medications   Medication Sig Start Date End Date Taking? Authorizing Provider  albuterol  (VENTOLIN  HFA) 108 (90 Base) MCG/ACT inhaler Inhale 1-2 puffs into the lungs every 4 (four) hours as needed for wheezing or shortness of breath. 06/23/23   Elnor Hila P, DO  alendronate (FOSAMAX) 70 MG tablet Take 70 mg by mouth once a week. 12/18/20   [provider]  Ascorbic Acid  (VITAMIN C PO) Take 1,000 mg by mouth daily.     [provider]  aspirin  81 MG tablet Take 81 mg by mouth daily.    [provider]  b complex vitamins capsule Take 1 capsule by mouth daily.    [provider]  Calcium  Carb-Cholecalciferol  (CALCIUM  1000 + D PO) Take 1,000 mg by mouth daily.    [provider]  cephALEXin  (KEFLEX ) 500 MG capsule Take 1 capsule (500 mg total) by mouth 3 (three) times daily. 06/04/22   Steinl, Kevin, MD  cetirizine (ZYRTEC) 10 MG tablet Take 10 mg by mouth daily as needed for allergies.    [provider]  Cholecalciferol  (VITAMIN D ) 50 MCG (2000 UT) tablet Take 2,000 Units by mouth daily.    [provider]  EZALLOR SPRINKLE  10 MG CPSP Take 1 capsule by mouth daily. 11/20/21  [provider]  ferrous sulfate  325 (65 FE) MG tablet Take 325 mg by mouth every other day.    [provider]  hydrochlorothiazide  (HYDRODIURIL ) 25 MG tablet Take 25 mg by mouth daily.    [provider]  Multiple Vitamin (MULTIVITAMIN) capsule Take 1 capsule by mouth daily.    [provider]  ondansetron  (ZOFRAN ) 4 MG tablet Take 1 tablet (4 mg total) by mouth every 6 (six) hours as needed for nausea. 09/14/21   Sheikh, Omair Latif, DO  potassium chloride  SA (KLOR-CON  M) 20 MEQ tablet Take 1 tablet (20 mEq total) by mouth daily for 14 days.  06/09/22 06/23/22  Trine Raynell Moder, MD  PRESCRIPTION MEDICATION Inject into the skin every 30 (thirty) days. Hydrocortisone  injection    [provider]  rosuvastatin  (CRESTOR ) 10 MG tablet Take 10 mg by mouth at bedtime. 01/06/21   [provider]  sertraline (ZOLOFT) 25 MG tablet Take 25 mg by mouth daily. 10/29/21   [provider]  XARELTO 20 MG TABS tablet Take 20 mg by mouth daily. 11/20/21   [provider]                                                                                                                                    Past Surgical History Past Surgical History:  Procedure Laterality Date   ABDOMINAL HYSTERECTOMY     APPENDECTOMY     APPLICATION OF CRANIAL NAVIGATION N/A 01/28/2021   Procedure: APPLICATION OF CRANIAL NAVIGATION;  Surgeon: Gillie Duncans, MD;  Location: [facility] OR;  Service: Neurosurgery;  Laterality: N/A;   CRANIOTOMY Left 01/28/2021   Procedure: 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;  Surgeon: Gillie Duncans, MD;  Location: [facility] OR;  Service: Neurosurgery;  Laterality: Left;   Dog bite  1970's   cosmetic surgery   TUBAL LIGATION     Family History Family History  Problem Relation Age of Onset   Esophageal cancer Mother    Heart attack Father 56    Social History Social History   Tobacco Use   Smoking status: Former    Current packs/day: 0.00    Average packs/day: 0.3 packs/day for 40.0 years (10.0 ttl pk-yrs)    Types: Cigarettes    Start date: 12/1978    Quit date: 12/2018    Years since quitting: 5.4   Smokeless tobacco: Never  Vaping Use   Vaping status: Never Used  Substance Use Topics   Alcohol use: No   Drug use: No   Allergies Codeine  Review of Systems Review of Systems  Gastrointestinal:  Positive for blood in stool.    Physical Exam Vital Signs  I have reviewed the triage vital signs BP 119/71   Pulse (!) 110   Temp 98.5 F (36.9 C) (Oral)   Resp (!) 22    SpO2 98%   Physical Exam Vitals and nursing note  reviewed.  Constitutional:      General: She is not in acute distress.    Appearance: She is well-developed.  HENT:     Head: Normocephalic and atraumatic.  Eyes:     Conjunctiva/sclera: Conjunctivae normal.  Cardiovascular:     Rate and Rhythm: Normal rate and regular rhythm.     Heart sounds: No murmur heard. Pulmonary:     Effort: Pulmonary effort is normal. No respiratory distress.     Breath sounds: Normal breath sounds.  Abdominal:     Palpations: Abdomen is soft.     Tenderness: There is no abdominal tenderness.  Genitourinary:    Rectum: Guaiac result positive.  Musculoskeletal:        General: No swelling.     Cervical back: Neck supple.  Skin:    General: Skin is warm and dry.     Capillary Refill: Capillary refill takes less than 2 seconds.  Neurological:     Mental Status: She is alert.  Psychiatric:        Mood and Affect: Mood normal.     ED Results and Treatments Labs (all labs ordered are listed, but only abnormal results are displayed) Labs Reviewed  COMPREHENSIVE METABOLIC PANEL WITH GFR - Abnormal; Notable for the following components:      Result Value   Glucose, Bld 113 (*)    Albumin 3.2 (*)    All other components within normal limits  CBC WITH DIFFERENTIAL/PLATELET - Abnormal; Notable for the following components:   WBC 10.9 (*)    Hemoglobin 9.2 (*)    HCT 31.7 (*)    MCH 23.7 (*)    MCHC 29.0 (*)    All other components within normal limits  POC OCCULT BLOOD, ED - Abnormal; Notable for the following components:   Fecal Occult Bld POSITIVE (*)    All other components within normal limits  TYPE AND SCREEN                                                                                                                          Radiology No results found.  Pertinent labs & imaging results that were available during my care of the patient were reviewed by me and considered in my medical  decision making (see MDM for details).  Medications Ordered in ED Medications - No data to display  Procedures Procedures  (including critical care time)  Medical Decision Making / ED Course   This patient presents to the ED for concern of abnormal labs, blood in stool, this involves an extensive number of treatment options, and is a complaint that carries with it a high risk of complications and morbidity.  The differential diagnosis includes upper GI bleed including PUD, varices, boerhaave's, Mallory Weiss tear, aortoenteric fistula versus lower GI bleed including mass, diverticulosis/diverticulitis, hemorrhoids, anal fissure, mesenteric ischemia, aortoenteric fistula, colitis  MDM: Patient seen in the emergency department for evaluation of abnormal labs and concern for GI bleeding.  Physical exam largely unremarkable with no appreciable abdominal tenderness to palpation.  Stool is guaiac positive but patient is on iron supplementation and this may be a false positive.  Laboratory evaluation with mild leukocytosis of 10.9, hemoglobin 9.2, albumin 3.2 but is otherwise unremarkable.  With no abdominal tenderness or bright red blood per rectum here in the Emergency Department, CT imaging deferred.  I had an extended discussion with the patient about her laboratory evaluation and it is certainly possible that she has a slow GI bleed but she currently does not meet inpatient criteria for admission or emergent colonoscopy.  Last hemoglobin was 9.8 on 06/04/2024 and this is only a very slight downtrend from then.  She is also already supplementing with iron.  I spoke with the patient's husband who states that he will reach out to the gastroenterologist to see if they can expedite outpatient colonoscopy.  She is already on a PPI.  She does not meet inpatient criteria today and  will be discharged with outpatient gastroenterology follow-up.  She was given strict return precautions which include any persistent melanotic stool, bright red blood per rectum, shortness of breath, chest pain which she voiced understanding.  Patient been discharged with outpatient follow-up.   Additional history obtained: -Additional history obtained from husband -External records from outside source obtained and reviewed including: Chart review including previous notes, labs, imaging, consultation notes   Lab Tests: -I ordered, reviewed, and interpreted labs.   The pertinent results include:   Labs Reviewed  COMPREHENSIVE METABOLIC PANEL WITH GFR - Abnormal; Notable for the following components:      Result Value   Glucose, Bld 113 (*)    Albumin 3.2 (*)    All other components within normal limits  CBC WITH DIFFERENTIAL/PLATELET - Abnormal; Notable for the following components:   WBC 10.9 (*)    Hemoglobin 9.2 (*)    HCT 31.7 (*)    MCH 23.7 (*)    MCHC 29.0 (*)    All other components within normal limits  POC OCCULT BLOOD, ED - Abnormal; Notable for the following components:   Fecal Occult Bld POSITIVE (*)    All other components within normal limits  TYPE AND SCREEN     Medicines ordered and prescription drug management: No orders of the defined types were placed in this encounter.   -I have reviewed the patients home medicines and have made adjustments as needed  Critical interventions none   Cardiac Monitoring: The patient was maintained on a cardiac monitor.  I personally viewed and interpreted the cardiac monitored which showed an underlying rhythm of: NSr  Social Determinants of Health:  Factors impacting patients care include: none   Reevaluation: After the interventions noted above, I reevaluated the patient and found that they have :stayed the same  Co morbidities that complicate the patient evaluation  Past Medical History:  Diagnosis Date  Anemia    Arthritis    DVT (deep venous thrombosis) (HCC)    High cholesterol    History of bleeding ulcers 1995   did not require blood transfusion. Dr Abran could not find in patient records.   History of blood transfusion    Hyperlipidemia    Hypertension    Osteopenia    Osteopenia    Panic attacks    PTSD (post-traumatic stress disorder)    Schizophrenia (HCC)    per daughter   Seasonal allergies       Dispostion: I considered admission for this patient, but at this time she does not meet inpatient criteria for admission and will be discharged outpatient follow-up     Final Clinical Impression(s) / ED Diagnoses Final diagnoses:  Gastrointestinal hemorrhage, unspecified gastrointestinal hemorrhage type  Anemia, unspecified type     @PCDICTATION @    Albertina Dixon, MD 06/14/24 [id number]
